# Patient Record
Sex: Male | Born: 1970
Health system: Southern US, Community
[De-identification: ages and names within clinical notes are randomized; demographics above are authoritative.]

## PROBLEM LIST (undated history)

## (undated) DIAGNOSIS — F419 Anxiety disorder, unspecified: Secondary | ICD-10-CM

## (undated) DIAGNOSIS — E7800 Pure hypercholesterolemia, unspecified: Secondary | ICD-10-CM

## (undated) DIAGNOSIS — F329 Major depressive disorder, single episode, unspecified: Secondary | ICD-10-CM

## (undated) DIAGNOSIS — F32A Depression, unspecified: Secondary | ICD-10-CM

## (undated) DIAGNOSIS — M5410 Radiculopathy, site unspecified: Secondary | ICD-10-CM

## (undated) HISTORY — DX: Anxiety disorder, unspecified: F41.9

## (undated) HISTORY — DX: Pure hypercholesterolemia, unspecified: E78.00

## (undated) HISTORY — DX: Radiculopathy, site unspecified: M54.10

## (undated) HISTORY — DX: Major depressive disorder, single episode, unspecified: F32.9

## (undated) HISTORY — DX: Depression, unspecified: F32.A

---

## 2016-05-28 ENCOUNTER — Ambulatory Visit: Payer: PRIVATE HEALTH INSURANCE | Attending: Family Medicine | Admitting: Family Medicine

## 2016-06-06 ENCOUNTER — Ambulatory Visit: Payer: Medicare (Managed Care) | Attending: Family Medicine | Admitting: Family Medicine

## 2016-06-06 ENCOUNTER — Encounter: Payer: Self-pay | Admitting: Licensed Clinical Social Worker

## 2016-06-06 ENCOUNTER — Other Ambulatory Visit: Payer: Self-pay

## 2016-06-06 DIAGNOSIS — M5430 Sciatica, unspecified side: Secondary | ICD-10-CM | POA: Diagnosis not present

## 2016-06-06 DIAGNOSIS — F329 Major depressive disorder, single episode, unspecified: Secondary | ICD-10-CM | POA: Diagnosis not present

## 2016-06-06 DIAGNOSIS — F419 Anxiety disorder, unspecified: Secondary | ICD-10-CM

## 2016-06-06 DIAGNOSIS — G8929 Other chronic pain: Secondary | ICD-10-CM | POA: Diagnosis not present

## 2016-06-06 DIAGNOSIS — Z79899 Other long term (current) drug therapy: Secondary | ICD-10-CM | POA: Diagnosis not present

## 2016-06-06 DIAGNOSIS — G4700 Insomnia, unspecified: Secondary | ICD-10-CM

## 2016-06-06 DIAGNOSIS — Z87828 Personal history of other (healed) physical injury and trauma: Secondary | ICD-10-CM

## 2016-06-06 DIAGNOSIS — R079 Chest pain, unspecified: Secondary | ICD-10-CM | POA: Diagnosis not present

## 2016-06-06 DIAGNOSIS — Z8659 Personal history of other mental and behavioral disorders: Secondary | ICD-10-CM | POA: Diagnosis not present

## 2016-06-06 DIAGNOSIS — K219 Gastro-esophageal reflux disease without esophagitis: Secondary | ICD-10-CM

## 2016-06-06 DIAGNOSIS — Z87820 Personal history of traumatic brain injury: Secondary | ICD-10-CM | POA: Diagnosis not present

## 2016-06-06 DIAGNOSIS — M549 Dorsalgia, unspecified: Secondary | ICD-10-CM | POA: Diagnosis not present

## 2016-06-06 DIAGNOSIS — M542 Cervicalgia: Secondary | ICD-10-CM | POA: Diagnosis not present

## 2016-06-06 MED ORDER — IBUPROFEN 800 MG PO TABS: 800.0000 mg | ORAL_TABLET | Freq: Three times a day (TID) | ORAL | 0 refills | Status: DC | PRN

## 2016-06-06 MED ORDER — PAROXETINE HCL 20 MG PO TABS: 20.0000 mg | ORAL_TABLET | Freq: Every day | ORAL | 2 refills | Status: DC

## 2016-06-06 MED ORDER — OMEPRAZOLE 20 MG PO CPDR: 20.0000 mg | DELAYED_RELEASE_CAPSULE | Freq: Every day | ORAL | 1 refills | Status: DC

## 2016-06-06 MED ORDER — METHOCARBAMOL 500 MG PO TABS
500.0000 mg | ORAL_TABLET | Freq: Three times a day (TID) | ORAL | 0 refills | Status: DC | PRN
Start: 2016-06-06 — End: 2016-07-18

## 2016-06-06 MED ORDER — TRAZODONE HCL 50 MG PO TABS: 25.0000 mg | ORAL_TABLET | Freq: Every evening | ORAL | 0 refills | Status: DC | PRN

## 2016-06-06 MED ORDER — DULOXETINE HCL 60 MG PO CPEP: 60.0000 mg | ORAL_CAPSULE | Freq: Every day | ORAL | 3 refills | Status: DC

## 2016-06-06 MED ORDER — AMITRIPTYLINE HCL 25 MG PO TABS: 25.0000 mg | ORAL_TABLET | Freq: Every day | ORAL | 2 refills | Status: DC

## 2016-06-06 MED ORDER — CYCLOBENZAPRINE HCL 10 MG PO TABS: 10.0000 mg | ORAL_TABLET | Freq: Three times a day (TID) | ORAL | 0 refills | Status: DC | PRN

## 2016-06-06 MED FILL — ?CYCLOBENZAPRINE 10 MG TABL: 10 | 20 days supply | Qty: 60 | Fill #0

## 2016-06-06 MED FILL — AMITRIPTYLINE HCL 25 MG TAB: 25 | 30 days supply | Qty: 30 | Fill #0

## 2016-06-06 MED FILL — PARoxetine HCL 20 MG TABS: 20 | 30 days supply | Qty: 30 | Fill #0

## 2016-06-06 MED FILL — ?OMEPRAZOLE DR 20 MG CAPSUL: 20 | 30 days supply | Qty: 30 | Fill #0

## 2016-06-06 MED FILL — IBUPROFEN 800 MG TABLET: 800 | 20 days supply | Qty: 60 | Fill #0

## 2016-06-06 MED FILL — DULoxetine HCL 60 MG CPEP: 60 | 30 days supply | Qty: 30 | Fill #0

## 2016-06-06 MED FILL — METHOCARBAMOL 500 MG TABLET: 500 | 10 days supply | Qty: 30 | Fill #0

## 2016-06-06 MED FILL — traZODone HCL 50 MG TABS: 50 | 30 days supply | Qty: 30 | Fill #0

## 2016-06-06 NOTE — Patient Instructions (Addendum)
Trazodone tablets Qu es este medicamento? La TRAZODONA se utiliza para tratar la depresin. Este medicamento puede ser utilizado para otros usos; si tiene alguna pregunta consulte con su proveedor de atencin mdica o con su farmacutico. MARCAS COMUNES: Desyrel Qu le debo informar a mi profesional de la salud antes de tomar este medicamento? Necesita saber si usted presenta alguno de los siguientes problemas o situaciones: -intento de suicidio o con ideas suicidas -trastorno bipolar -problemas sanguneos -glaucoma -enfermedad cardiaca o ataque cardiaco previo -latidos cardiacos irregulares -enfermedad renal o heptica -niveles bajos de sodio en la sangre -una reaccin alrgica o inusual a la trazodona, a otros medicamentos, alimentos, colorantes o conservantes -si est embarazada o buscando quedar embarazada -si est amamantando a un beb Cmo debo utilizar este medicamento? Tome este medicamento por va oral con un vaso de agua. Siga las instrucciones de la etiqueta del [place]. [employer] medicamento poco despus de una comida o un refrigerio liviano. Tome sus dosis a intervalos regulares. No tome su medicamento con una frecuencia mayor a la indicada. No deje de tomar [employer] repentinamente a menos que as indique su mdico. El detener este medicamento demasiado rpido puede causar efectos secundarios graves o puede empeorar su condicin. Su farmacutico le dar una gua del medicamento especial con cada receta y relleno. Asegrese de leer esta informacin cada vez cuidadosamente. Hable con su pediatra para informarse acerca del uso de este medicamento en nios. Puede requerir atencin especial. Sobredosis: Pngase en contacto inmediatamente con un centro toxicolgico o una sala de urgencia si usted cree que haya tomado demasiado medicamento. ATENCIN: [employer] es solo para usted. No comparta este medicamento con nadie. Qu sucede si me olvido de una dosis? Si  olvida una dosis, tmela lo antes posible. Si es casi la hora de la prxima dosis, tome slo esa dosis. No tome dosis adicionales o dobles. Qu puede interactuar con este medicamento? No tome esta medicina con ninguno de los siguientes medicamentos: ciertos medicamentos para infecciones micticas, tales como fluconazol, quetoconazol, itraconazol, posaconazol, voriconazol cisapride dofetilida dronedarona linezolid IMAOs, tales como Carbex, Eldepryl, Marplan, Nardil y Parnate mesoridazina azul de metileno (va intravenosa) pimozida saquinavir tioridazina ziprasidona Esta medicina tambin puede interactuar con los siguientes medicamentos: alcohol medicamentos antivricos para el VIH o SIDA aspirina o medicamentos tipo aspirina barbitricos tales como el fenobarbital ciertos medicamentos para la presin sangunea, enfermedad cardiaca, pulso cardiaco irregular ciertos medicamentos para la depresin, ansiedad o trastornos psicticos ciertos medicamentos para las migraas, tales como almotriptn, eletriptn, frovatriptn, naratriptn, rizatriptn, sumatriptn, zolmitriptn ciertos medicamentos para convulsiones, tales como carbamazepina y fenitona ciertos medicamento para conciliar el sueo ciertos medicamentos que tratan o previenen cogulos sanguneos, como dalteparina, enoxaparina, warfarina digoxina fentanilo litio los [place], medicamentos para el dolor o inflamacin, como ibuprofeno o naproxeno otros medicamentos que prolongan el intervalo QT (causa un ritmo cardiaco anormal) rasagilina medicamentos a base de hierbas que contienen kava kava, hierba de [place] o valeriana tramadol triptfano Puede ser que esta lista no menciona todas las posibles interacciones. Informe a su profesional de [employer]	[place] de [employer] productos a base de hierbas, medicamentos de [place] o suplementos nutritivos que est tomando. Si usted fuma, consume bebidas alcohlicas o si utiliza drogas ilegales, indqueselo tambin a su  profesional de KB [employer]	[place]. Algunas sustancias pueden interactuar con su medicamento. A qu debo estar atento al usar Coca[employer]Cola? Informe a su mdico si sus sntomas no mejoran o si empeoran. Visite a su mdico o a Barrister's clerk de [employer]	[place]  para chequear su evolucin peridicamente. Debido que puede ser necesario tomar este medicamento durante varias semanas para que sea posible observar sus efectos en forma [place], es importante que sigue su tratamiento como recetado por su mdico. Los pacientes y sus familias deben estar atentos si empeora la depresin o ideas suicidas. Tambin est atento a cambios repentinos o severos de emocin, tales como el sentirse ansioso, agitado, lleno de pnico, irritable, hostil, agresivo, impulsivo, inquietud severa, demasiado excitado y hiperactivo o dificultad para conciliar el sueo. Si esto ocurre, especialmente al comenzar con el tratamiento o al cambiar de dosis, comunquese con su profesional de [employer]	[place]. Puede experimentar somnolencia, mareos o visin borrosa. No conduzca ni utilice maquinaria, ni haga nada que Associate Professor en estado de alerta hasta que sepa cmo le afecta este medicamento. No se siente ni se ponga de pie con rapidez, especialmente si es un paciente de edad avanzada. Esto reduce el riesgo de mareos o [employer]. El alcohol puede interferir con el efecto de este medicamento. Evite consumir bebidas alcohlicas. Este medicamento puede provocar sequedad de los ojos y visin borrosa. Su [place] lentes de contacto, puede sentir [employer]. Las gotas lubricantes pueden ayudarle. Si el problema no desaparece o es severo, visite a su mdico de ojos. Este medicamento puede secarle la boca. El Engineer, manufacturing systems chicle sin azcar, chupar caramelos duros y tomar agua en abundancia le ayudarn a mantener la boca hmeda. Si el problema no desaparece o es severo, consulte a su mdico. Qu efectos secundarios puedo tener al [employer] este medicamento? Efectos  secundarios que debe informar a su mdico o a Barrister's clerk de la salud tan pronto como sea posible: Chief of Staff, como erupcin cutnea, comezn/picazn o urticarias, e hinchazn de la cara, los labios o la lengua estado de nimo elevado, menor necesidad de dormir, pensamientos acelerados, conducta impulsiva confusin ritmo cardiaco rpido, irregular sensacin de desmayos o aturdimiento, cadas sensacin de agitacin, enojo o irritabilidad prdida de equilibrio o coordinacin ereccin dolorosa o prolongada inquietud, caminar de un lado a otro, incapacidad para quedarse quieto ideas suicidas u otros cambios en el estado de nimo temblores dificultad para conciliar el sueo convulsiones sangrado o moretones inusuales Efectos secundarios que generalmente no requieren atencin mdica (infrmelos a su mdico o a Barrister's clerk de la salud si persisten o si son molestos): cambios en el deseo o desempeo sexual cambios en el apetito o el peso estreimiento dolor de cabeza dolores musculares nuseas Puede ser que esta lista no menciona todos los posibles efectos secundarios. Comunquese a su mdico por asesoramiento mdico [employer]. Usted puede informar los efectos secundarios a la FDA por telfono al 1-800-FDA-1088. Dnde debo guardar mi medicina? Mantngala fuera del alcance de los nios. Gurdela a [employer], entre 15 y 24 grados C (29 y 9 grados F). Protjala de la luz. Mantenga el envase bien cerrado. Deseche los medicamentos que no haya utilizado, despus de la fecha de vencimiento. ATENCIN: Este folleto es un resumen. Puede ser que no cubra toda la posible informacin. Si usted tiene preguntas acerca de esta medicina, consulte con su mdico, su farmacutico o su profesional de Technical sales engineer.  2017 Elsevier/Gold Standard (2015-11-09 00:00:00)      Methocarbamol tablets Qu es este medicamento? El METOCARBAMOL ayuda a Best boy y la rigidez muscular  causado por esfuerzos, esguinces y Scientist, research (medical) lesiones de los msculos. Este medicamento puede ser utilizado para otros usos; si tiene alguna pregunta consulte con su proveedor de atencin mdica o con  su farmacutico. MARCAS COMUNES: [place] Qu le debo informar a mi profesional de la salud antes de tomar este medicamento? Necesita saber si usted presenta alguno de los siguientes problemas o situaciones: -enfermedad renal -convulsiones -una reaccin alrgica o inusual al metocarbamol, a otros medicamentos, alimentos, colorantes o conservantes -si est embarazada o buscando quedar embarazada -si est amamantando a un beb Cmo debo utilizar este medicamento? Tome este medicamento por va oral con un vaso lleno de agua. Siga las instrucciones de la etiqueta del [place]. Tome sus dosis a intervalos regulares. No tome su medicamento con una frecuencia mayor a la indicada. Hable con su pediatra para informarse acerca del uso de este medicamento en nios. Puede requerir atencin especial. Sobredosis: Pngase en contacto inmediatamente con un centro toxicolgico o una sala de urgencia si usted cree que haya tomado demasiado medicamento. ATENCIN: [employer] es solo para usted. No comparta este medicamento con nadie. Qu sucede si me olvido de una dosis? Si olvida una dosis, tmela lo antes posible. Si es casi la hora de la prxima dosis, tome slo esa dosis. No tome dosis adicionales o dobles. Qu puede interactuar con este medicamento? No tome esta medicina con ninguno de los siguientes medicamentos: medicamentos narcticos para la tos Esta medicina tambin puede interactuar con los siguientes medicamentos: alcohol antihistamnicos para Buyer, retail, tos y resfro ciertos medicamentos para la ansiedad o para conciliar el sueo ciertos medicamentos para la depresin, como amitriptilina, fluoxetina, sertralina ciertos medicamentos para convulsiones, tales como fenobarbital, primidona inhibidores de  colinesterasa, tales como neostigmina, ambenonium y bromuro de piridostigmina anestsicos generales, tales como halotano, isoflurano, metoxiflurano, propofol anestsicos locales, tales como lidocana, pramoxina, tetracana medicamentos para relajar los msculos antes de una ciruga medicamentos narcticos para Conservation officer, historic buildings fenotiazinas, tales como clorpromazina, Musician, Government social research officer, tioridazina Puede ser que esta lista no menciona todas las posibles interacciones. Informe a su profesional de KB Home	[place] de [employer] productos a base de hierbas, medicamentos de [place] o suplementos nutritivos que est tomando. Si usted fuma, consume bebidas alcohlicas o si utiliza drogas ilegales, indqueselo tambin a su profesional de [employer]	[place]. Algunas sustancias pueden interactuar con su medicamento. A qu debo estar atento al usar Coca[employer]Cola? Informe a su mdico o a su profesional de la salud si sus sntomas no comienzan a mejorar o si empeoran. Puede experimentar somnolencia o mareos. No conduzca, no utilice maquinaria ni haga nada que Associate Professor en estado de alerta hasta que sepa cmo le afecta este medicamento. No se siente ni se ponga de pie con rapidez, especialmente si es un paciente de edad avanzada. Esto reduce el riesgo de mareos o [employer]. El alcohol puede interferir con el efecto de este medicamento. Evite consumir bebidas alcohlicas. Si est tomando otro medicamento que tambin causa somnolencia, es posible que tenga ms efectos secundarios. Entrguele a su proveedor de atencin mdica una lista de todos los medicamentos que [place]. Su mdico le dir cunto Dentist. No tome ms medicamento que lo indicado. Llame al servicio de emergencias para recibir ayuda si tiene problemas para respirar o somnolencia inusual. Qu efectos secundarios puedo tener al [employer] este medicamento? Efectos secundarios que debe informar a su mdico o a Barrister's clerk de la salud tan pronto como sea  posible: Chief of Staff, como erupcin cutnea, comezn/picazn o urticarias, hinchazn de la cara, los labios o la lengua problemas respiratorios confusin convulsiones cansancio o debilidad inusual Efectos secundarios que generalmente no requieren atencin mdica (debe informarlos a su mdico o a Barrister's clerk de la  salud si persisten o si son molestos): mareos dolor de cabeza sabor metlico cansancio Higher education careers adviser Puede ser que esta lista no menciona todos los posibles efectos secundarios. Comunquese a su mdico por asesoramiento mdico [employer]. Usted puede informar los efectos secundarios a la FDA por telfono al 1-800-FDA-1088. Dnde debo guardar mi medicina? Mantngala fuera del alcance de los nios. Gurdela a una temperatura, entre 20 y 23 grados C (39 y 3 grados F). Mantenga el envase bien cerrado. Deseche los medicamentos que no haya utilizado, despus de la fecha de vencimiento. ATENCIN: Este folleto es un resumen. Puede ser que no cubra toda la posible informacin. Si usted tiene preguntas acerca de esta medicina, consulte con su mdico, su farmacutico o su profesional de Technical sales engineer.  2017 Elsevier/Gold Standard (2015-02-27 00:00:00)
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
## 2016-06-06 NOTE — Progress Notes (Signed)
Patient is here for establish care   Patient complains about back pain, head pain and his right leg get numbs all the way to his right toe  Patient also stated that his testicles pain that comes and goes  Patient also has severe depression
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
## 2016-06-09 ENCOUNTER — Telehealth: Payer: Self-pay | Admitting: Family Medicine

## 2016-06-09 LAB — H. PYLORI BREATH TEST: H. PYLORI BREATH TEST: NOT DETECTED

## 2016-06-09 NOTE — Telephone Encounter (Signed)
[facility] Prior Authorization  Just inform me  that his insurance is good until 06/11/2016

## 2016-06-09 NOTE — Telephone Encounter (Signed)
Good Afternoon  [facility]  Call me and let me know that patient insurance need prior authorization I called them and they said that I need to fax clinic notes and it will take 24 hours for approval . Can you please, call radiology to reschedule his appointment for tomorrow at 3pm and also notify the patient  Thank You .

## 2016-06-09 NOTE — Telephone Encounter (Signed)
CMA call to inform patient that the insurance need authorization prior to the appointment & it will takes 24 hour to get approve so if it doesn't get approve we going have to rescheduled the appt   Spoke with patient wife  Wife was aware and understood

## 2016-06-09 NOTE — Telephone Encounter (Signed)
CMA call patient to inform about canceled appointment for CT scan of head because of the expire insurance  Patient was aware and understood

## 2016-06-09 NOTE — Telephone Encounter (Signed)
Joshua SimasJohanna  Can you contact radiology at [phone] to canceled the appt and to remind you that his insurance expired 2/28 so after that don't need prior authorization  Thank you  [facility] is asking me if not they are going to canceled the appt  .

## 2016-06-10 ENCOUNTER — Ambulatory Visit (HOSPITAL_COMMUNITY): Payer: PRIVATE HEALTH INSURANCE

## 2016-06-12 NOTE — Telephone Encounter (Signed)
CMA call to go over H pylori results   Patient wife Verify DOB  Spoke with patient wife she was aware and understood

## 2016-06-12 NOTE — Telephone Encounter (Signed)
-----   Message from Lizbeth BarkMandesia R Hairston, FNP sent at 06/12/2016  3:57 AM EST ----- -H.pylori is negative. H.pylori is a bacteria that can infect the stomach and cause stomach ulcers. It can cause symptoms of acid reflux like heartburn.

## 2016-07-18 ENCOUNTER — Ambulatory Visit: Payer: PPO | Attending: Family Medicine | Admitting: Family Medicine

## 2016-07-18 VITALS — BP 124/82 | HR 67 | Temp 98.0°F | Resp 18 | Ht 68.0 in | Wt 169.4 lb

## 2016-07-18 DIAGNOSIS — M5441 Lumbago with sciatica, right side: Secondary | ICD-10-CM | POA: Diagnosis not present

## 2016-07-18 DIAGNOSIS — Z7689 Persons encountering health services in other specified circumstances: Secondary | ICD-10-CM | POA: Insufficient documentation

## 2016-07-18 DIAGNOSIS — Z87820 Personal history of traumatic brain injury: Secondary | ICD-10-CM | POA: Diagnosis not present

## 2016-07-18 DIAGNOSIS — K4090 Unilateral inguinal hernia, without obstruction or gangrene, not specified as recurrent: Secondary | ICD-10-CM

## 2016-07-18 DIAGNOSIS — G8929 Other chronic pain: Secondary | ICD-10-CM | POA: Diagnosis not present

## 2016-07-18 DIAGNOSIS — N50819 Testicular pain, unspecified: Secondary | ICD-10-CM

## 2016-07-18 DIAGNOSIS — Z87828 Personal history of other (healed) physical injury and trauma: Secondary | ICD-10-CM

## 2016-07-18 DIAGNOSIS — Z79899 Other long term (current) drug therapy: Secondary | ICD-10-CM | POA: Diagnosis not present

## 2016-07-18 DIAGNOSIS — F339 Major depressive disorder, recurrent, unspecified: Secondary | ICD-10-CM | POA: Diagnosis not present

## 2016-07-18 MED ORDER — METHOCARBAMOL 500 MG PO TABS: 500.0000 mg | ORAL_TABLET | Freq: Three times a day (TID) | ORAL | 0 refills | Status: DC | PRN

## 2016-07-18 MED ORDER — ACETAMINOPHEN-CODEINE 300-30 MG PO TABS: 1.0000 | ORAL_TABLET | Freq: Four times a day (QID) | ORAL | 0 refills | Status: DC | PRN

## 2016-07-18 MED FILL — ACETAMINOPHEN/COD #3 TABLET: 300-30 | 6 days supply | Qty: 50 | Fill #0

## 2016-07-18 MED FILL — METHOCARBAMOL 500 MG TABLET: 500 | 10 days supply | Qty: 30 | Fill #0

## 2016-07-18 NOTE — Progress Notes (Signed)
Patient is here for f/up  Patient  Complain about lower back pain  Patient has taking his ibuprofen & his robaxin for today  Patient has eaten for today
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
## 2016-07-21 ENCOUNTER — Ambulatory Visit (HOSPITAL_COMMUNITY)
Admission: RE | Admit: 2016-07-21 | Discharge: 2016-07-21 | Disposition: A | Discharge status: Home / Self Care | Payer: PPO | Source: Ambulatory Visit | Attending: Family Medicine | Admitting: Family Medicine

## 2016-07-21 ENCOUNTER — Encounter (HOSPITAL_COMMUNITY): Payer: Self-pay

## 2016-07-21 DIAGNOSIS — I700 Atherosclerosis of aorta: Secondary | ICD-10-CM | POA: Diagnosis not present

## 2016-07-21 DIAGNOSIS — M5430 Sciatica, unspecified side: Secondary | ICD-10-CM | POA: Insufficient documentation

## 2016-07-21 DIAGNOSIS — F339 Major depressive disorder, recurrent, unspecified: Secondary | ICD-10-CM | POA: Insufficient documentation

## 2016-07-21 DIAGNOSIS — M549 Dorsalgia, unspecified: Principal | ICD-10-CM

## 2016-07-21 DIAGNOSIS — M545 Low back pain: Secondary | ICD-10-CM | POA: Diagnosis not present

## 2016-07-23 ENCOUNTER — Other Ambulatory Visit: Payer: Self-pay | Admitting: Family Medicine

## 2016-07-28 ENCOUNTER — Other Ambulatory Visit: Payer: Self-pay | Admitting: Family Medicine

## 2016-07-28 DIAGNOSIS — M5441 Lumbago with sciatica, right side: Secondary | ICD-10-CM

## 2016-07-28 DIAGNOSIS — M5146 Schmorl's nodes, lumbar region: Secondary | ICD-10-CM

## 2016-07-28 DIAGNOSIS — I700 Atherosclerosis of aorta: Secondary | ICD-10-CM

## 2016-07-28 DIAGNOSIS — G8929 Other chronic pain: Secondary | ICD-10-CM

## 2016-07-28 MED ORDER — ASPIRIN 81 MG PO TABS: 81.0000 mg | ORAL_TABLET | Freq: Every day | ORAL | 0 refills | Status: DC

## 2016-07-29 ENCOUNTER — Telehealth: Payer: Self-pay

## 2016-07-29 NOTE — Telephone Encounter (Signed)
CMA call patient to go over results of x ray  Patient did not answer but left a VM stating the reason of the call & to call me back

## 2016-07-29 NOTE — Telephone Encounter (Signed)
Patient wife called to speak with nurse to get the X-Ray result, please follow up

## 2016-07-29 NOTE — Telephone Encounter (Signed)
-----   Message from Lizbeth Bark, FNP sent at 07/28/2016  1:33 PM EDT ----- Cervical spine x-ray showed mild bone impingement to the cervical spine. Follow up with orthopedic referral.

## 2016-07-29 NOTE — Telephone Encounter (Signed)
CMA call Patient to go over lab results  Patient Verify DOB  Patient was aware and understood

## 2016-07-29 NOTE — Telephone Encounter (Signed)
-----   Message from Lizbeth Bark, FNP sent at 07/28/2016  1:28 PM EDT ----- No chronic bone abnormality of the spine.  There is an area of possible spinal disc herniation at the lumbar spine. 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 This can increase your risk of cardiovascular diseases. You will be prescribed aspirin to help lower this risk. Recommend scheduling labs appointment to have cholesterol levels & liver function checked to determine if cholesterol lowering medications are appropriate. Due to chronic symptoms of back MRI of spine was recommended. Will order MRI.

## 2016-07-30 ENCOUNTER — Ambulatory Visit (HOSPITAL_COMMUNITY)
Admission: RE | Admit: 2016-07-30 | Discharge: 2016-07-30 | Disposition: A | Discharge status: Home / Self Care | Payer: PPO | Source: Ambulatory Visit | Attending: Family Medicine | Admitting: Family Medicine

## 2016-07-30 DIAGNOSIS — J329 Chronic sinusitis, unspecified: Secondary | ICD-10-CM | POA: Insufficient documentation

## 2016-07-30 DIAGNOSIS — Z87828 Personal history of other (healed) physical injury and trauma: Secondary | ICD-10-CM | POA: Insufficient documentation

## 2016-07-30 DIAGNOSIS — R51 Headache: Secondary | ICD-10-CM | POA: Diagnosis not present

## 2016-07-30 DIAGNOSIS — Z87820 Personal history of traumatic brain injury: Secondary | ICD-10-CM | POA: Insufficient documentation

## 2016-07-31 ENCOUNTER — Telehealth: Payer: Self-pay | Admitting: Family Medicine

## 2016-07-31 ENCOUNTER — Other Ambulatory Visit: Payer: Self-pay | Admitting: Family Medicine

## 2016-07-31 DIAGNOSIS — G8929 Other chronic pain: Secondary | ICD-10-CM

## 2016-07-31 DIAGNOSIS — Z299 Encounter for prophylactic measures, unspecified: Secondary | ICD-10-CM

## 2016-07-31 DIAGNOSIS — M549 Dorsalgia, unspecified: Secondary | ICD-10-CM

## 2016-07-31 DIAGNOSIS — M5441 Lumbago with sciatica, right side: Secondary | ICD-10-CM

## 2016-07-31 DIAGNOSIS — J349 Unspecified disorder of nose and nasal sinuses: Secondary | ICD-10-CM

## 2016-07-31 DIAGNOSIS — M542 Cervicalgia: Secondary | ICD-10-CM

## 2016-07-31 MED ORDER — DIAZEPAM 5 MG PO TABS: 5.0000 mg | ORAL_TABLET | Freq: Once | ORAL | 0 refills | Status: DC

## 2016-07-31 MED ORDER — LORAZEPAM 1 MG PO TABS
1.0000 mg | ORAL_TABLET | Freq: Once | ORAL | 0 refills | Status: AC
Start: 2016-07-31 — End: 2016-07-31

## 2016-07-31 NOTE — Telephone Encounter (Signed)
Intrepreter service used. Interpreter ID# [id number]. Notified patient's spouse of imaging results and plan. She communicates understanding.

## 2016-07-31 NOTE — Telephone Encounter (Signed)
CMA call patient to let them know their MRI appt on 08/12/2016 @ 1:00 pm @  [facility]   Spoke with wife patient   Was aware and understood

## 2016-08-12 ENCOUNTER — Ambulatory Visit (HOSPITAL_COMMUNITY): Admission: RE | Admit: 2016-08-12 | Payer: PPO | Source: Ambulatory Visit

## 2016-08-14 DIAGNOSIS — R1031 Right lower quadrant pain: Secondary | ICD-10-CM | POA: Diagnosis not present

## 2016-08-15 DIAGNOSIS — J343 Hypertrophy of nasal turbinates: Secondary | ICD-10-CM | POA: Diagnosis not present

## 2016-08-15 DIAGNOSIS — J310 Chronic rhinitis: Secondary | ICD-10-CM | POA: Diagnosis not present

## 2016-08-15 DIAGNOSIS — R51 Headache: Secondary | ICD-10-CM | POA: Diagnosis not present

## 2016-09-06 ENCOUNTER — Ambulatory Visit (HOSPITAL_COMMUNITY)
Admission: RE | Admit: 2016-09-06 | Discharge: 2016-09-06 | Disposition: A | Discharge status: Home / Self Care | Payer: PPO | Source: Ambulatory Visit | Attending: Family Medicine | Admitting: Family Medicine

## 2016-09-06 DIAGNOSIS — M5441 Lumbago with sciatica, right side: Secondary | ICD-10-CM | POA: Insufficient documentation

## 2016-09-06 DIAGNOSIS — M5127 Other intervertebral disc displacement, lumbosacral region: Secondary | ICD-10-CM | POA: Diagnosis not present

## 2016-09-06 DIAGNOSIS — M129 Arthropathy, unspecified: Secondary | ICD-10-CM | POA: Diagnosis not present

## 2016-09-06 DIAGNOSIS — G8929 Other chronic pain: Secondary | ICD-10-CM | POA: Insufficient documentation

## 2016-09-06 DIAGNOSIS — M5146 Schmorl's nodes, lumbar region: Secondary | ICD-10-CM | POA: Diagnosis not present

## 2016-09-06 DIAGNOSIS — M545 Low back pain: Secondary | ICD-10-CM | POA: Diagnosis not present

## 2016-09-09 ENCOUNTER — Encounter (INDEPENDENT_AMBULATORY_CARE_PROVIDER_SITE_OTHER): Payer: Self-pay | Admitting: Orthopaedic Surgery

## 2016-09-09 ENCOUNTER — Ambulatory Visit (INDEPENDENT_AMBULATORY_CARE_PROVIDER_SITE_OTHER): Payer: PPO | Admitting: Orthopaedic Surgery

## 2016-09-09 VITALS — BP 142/92 | HR 66 | Ht 68.0 in | Wt 178.0 lb

## 2016-09-09 DIAGNOSIS — M545 Low back pain, unspecified: Secondary | ICD-10-CM

## 2016-09-09 DIAGNOSIS — G8929 Other chronic pain: Secondary | ICD-10-CM | POA: Diagnosis not present

## 2016-09-09 DIAGNOSIS — M542 Cervicalgia: Secondary | ICD-10-CM

## 2016-09-09 MED ORDER — LORAZEPAM 2 MG PO TABS: ORAL_TABLET | ORAL | 0 refills | Status: DC
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
## 2016-09-09 NOTE — Addendum Note (Signed)
Addended by: Rogers SeedsYEATTS, Taiden Raybourn M on: 09/09/2016 04:00 PM   Modules accepted: Orders

## 2016-09-10 ENCOUNTER — Ambulatory Visit (HOSPITAL_COMMUNITY): Payer: PPO | Admitting: Psychiatry

## 2016-09-11 ENCOUNTER — Telehealth (INDEPENDENT_AMBULATORY_CARE_PROVIDER_SITE_OTHER): Payer: Self-pay | Admitting: *Deleted

## 2016-09-11 NOTE — Telephone Encounter (Signed)
Pt has appt scheduled at [facility] on June 11 at 5pm, pt is to arrive 15 mins early to register, pt will also need a driver with him and do not take the valium until he arrives at hospital. [facility] for appt information

## 2016-09-12 ENCOUNTER — Ambulatory Visit: Payer: PPO | Attending: Family Medicine | Admitting: Family Medicine

## 2016-09-12 ENCOUNTER — Encounter: Payer: Self-pay | Admitting: Family Medicine

## 2016-09-12 VITALS — BP 132/90 | HR 76 | Temp 98.0°F | Resp 18 | Ht 68.0 in | Wt 173.8 lb

## 2016-09-12 DIAGNOSIS — Z8659 Personal history of other mental and behavioral disorders: Secondary | ICD-10-CM | POA: Diagnosis not present

## 2016-09-12 DIAGNOSIS — K4090 Unilateral inguinal hernia, without obstruction or gangrene, not specified as recurrent: Secondary | ICD-10-CM | POA: Diagnosis not present

## 2016-09-12 DIAGNOSIS — M545 Low back pain: Secondary | ICD-10-CM | POA: Insufficient documentation

## 2016-09-12 DIAGNOSIS — G8929 Other chronic pain: Secondary | ICD-10-CM

## 2016-09-12 DIAGNOSIS — M5441 Lumbago with sciatica, right side: Secondary | ICD-10-CM

## 2016-09-12 DIAGNOSIS — N50819 Testicular pain, unspecified: Secondary | ICD-10-CM

## 2016-09-12 DIAGNOSIS — F329 Major depressive disorder, single episode, unspecified: Secondary | ICD-10-CM | POA: Diagnosis not present

## 2016-09-12 DIAGNOSIS — Z7982 Long term (current) use of aspirin: Secondary | ICD-10-CM | POA: Diagnosis not present

## 2016-09-12 DIAGNOSIS — Z760 Encounter for issue of repeat prescription: Secondary | ICD-10-CM | POA: Diagnosis not present

## 2016-09-12 DIAGNOSIS — T1490XS Injury, unspecified, sequela: Secondary | ICD-10-CM | POA: Diagnosis not present

## 2016-09-12 DIAGNOSIS — N50811 Right testicular pain: Secondary | ICD-10-CM | POA: Diagnosis not present

## 2016-09-12 MED ORDER — PAROXETINE HCL 20 MG PO TABS: 20.0000 mg | ORAL_TABLET | Freq: Every day | ORAL | 2 refills | Status: DC

## 2016-09-12 MED ORDER — METHOCARBAMOL 500 MG PO TABS: 500.0000 mg | ORAL_TABLET | Freq: Three times a day (TID) | ORAL | 0 refills | Status: DC | PRN

## 2016-09-12 MED ORDER — ASPIRIN 81 MG PO TABS: 81.0000 mg | ORAL_TABLET | Freq: Every day | ORAL | 3 refills | Status: DC

## 2016-09-12 MED ORDER — ACETAMINOPHEN-CODEINE 300-30 MG PO TABS: 1.0000 | ORAL_TABLET | Freq: Four times a day (QID) | ORAL | 0 refills | Status: DC | PRN

## 2016-09-12 MED ORDER — DULOXETINE HCL 60 MG PO CPEP: 60.0000 mg | ORAL_CAPSULE | Freq: Every day | ORAL | 3 refills | Status: DC

## 2016-09-12 MED ORDER — IBUPROFEN 800 MG PO TABS: 800.0000 mg | ORAL_TABLET | Freq: Three times a day (TID) | ORAL | 0 refills | Status: DC | PRN

## 2016-09-12 NOTE — Progress Notes (Signed)
Subjective:  Patient ID: Joshua [name], male    DOB: [birth year]  Age: 46 y.o. MRN: [id number]  CC: Follow-up   HPI Joshua [name] presents for    Subjective:  Patient ID: Joshua [name], male    DOB: 03-26-1971  Age: 46 y.o. MRN: [id number]  CC: Follow-up   HPI Joshua [name] presents for    Chronic back pain: History of back injuries in 2008 and 2011 while in [place] Reports right leg sciatica. Back pain on average 9 out of 10. Denies any bowel or bladder incontinence. History of spinal injections in the past. Reports ambulation with cane since 2012. Previous workup has included x-ray and MRI.  He is now being seen by an orthopedic specialist. Current medications include anti-inflammatories, muscle relaxants, and narcotic pain medication.   History of depression: Diagnosed with depression in 2012 while in [place] Reports being under the care of a psychiatrist. He reports being late psychiatric appointment and appointment had to be rescheduled.  Reports not taking Cymbalta. Denies any SI/HI. Referral made at last visit.   Testicular pain: Pain to right testicle. Denies any history of significant injury. Denies any nausea or vomiting, swelling, or temperature change to the right testicle.Denies any masses or lumps to the testicles. Pain is aggrevated by bearing down.symptoms stable and unchanged since last visit.  Referral to general  surgery  previous office visit.  Outpatient Medications Prior to Visit  Medication Sig Dispense Refill  . fluticasone (FLONASE) 50 MCG/ACT nasal spray Place into the nose.    Marland Kitchen omeprazole (PRILOSEC) 20 MG capsule Take 1 capsule (20 mg total) by mouth daily. 30 capsule 1  . risperiDONE (RISPERDAL) 0.5 MG tablet Take 0.5 mg by mouth at bedtime.    . temazepam (RESTORIL) 30 MG capsule Take 30 mg by mouth at bedtime as needed for sleep.    . traZODone (DESYREL) 50 MG tablet Take 0.5-1 tablets (25-50 mg total) by mouth at bedtime as needed for  sleep. 30 tablet 0  . Acetaminophen-Codeine (TYLENOL/CODEINE #3) 300-30 MG tablet Take 1-2 tablets by mouth every 6 (six) hours as needed for pain. 50 tablet 0  . aspirin 81 MG tablet Take 1 tablet (81 mg total) by mouth daily. 90 tablet 0  . DULoxetine (CYMBALTA) 60 MG capsule Take 1 capsule (60 mg total) by mouth daily. 30 capsule 3  . ibuprofen (ADVIL,MOTRIN) 800 MG tablet Take 1 tablet (800 mg total) by mouth every 8 (eight) hours as needed for moderate pain or cramping (Take with food.). 60 tablet 0  . LORazepam (ATIVAN) 2 MG tablet Take one tablet prior to MRI. 1 tablet 0  . methocarbamol (ROBAXIN) 500 MG tablet Take 1 tablet (500 mg total) by mouth every 8 (eight) hours as needed for muscle spasms. 30 tablet 0  . PARoxetine (PAXIL) 20 MG tablet Take 1 tablet (20 mg total) by mouth daily. 30 tablet 2   No facility-administered medications prior to visit.     ROS Review of Systems  Respiratory: Negative.   Cardiovascular: Negative.   Gastrointestinal: Negative.   Genitourinary: Positive for testicular pain (hernia).  Musculoskeletal: Positive for back pain (chronic).  Neurological: Positive for weakness.  Psychiatric/Behavioral:       History of major depression disorder.    Objective:  BP 132/90 (BP Location: Left Arm, Patient Position: Sitting, Cuff Size: Normal)   Pulse 76   Temp 98 F (36.7 C) (Oral)   Resp 18   Ht 5\' 8"  (1.727 m)  Wt 173 lb 12.8 oz (78.8 kg)   SpO2 94%   BMI 26.43 kg/m   BP/Weight 09/12/2016 09/09/2016 07/18/2016  Systolic BP 132 142 124  Diastolic BP 90 92 82  Wt. (Lbs) 173.8 178 169.4  BMI 26.43 27.06 25.76   Physical Exam  Constitutional: He is oriented to person, place, and time. He appears well-developed and well-nourished.  HENT:  Head: Normocephalic and atraumatic.  Right Ear: External ear normal.  Left Ear: External ear normal.  Nose: Nose normal.  Mouth/Throat: Oropharynx is clear and moist.  Eyes: Conjunctivae are normal. Pupils are  equal, round, and reactive to light.  Neck: No JVD present.  Cardiovascular: Normal rate, regular rhythm, normal heart sounds and intact distal pulses.   Pulmonary/Chest: Effort normal and breath sounds normal.  Abdominal: Soft. Bowel sounds are normal. A hernia is present. Hernia confirmed positive in the right inguinal area.  Musculoskeletal:       Lumbar back: He exhibits decreased range of motion and pain.  Neurological: He is alert and oriented to person, place, and time. He has normal reflexes. Gait (ambulates  with cane) abnormal.  Skin: Skin is warm and dry.  Psychiatric: His affect is not inappropriate. He expresses no homicidal and no suicidal ideation. He expresses no suicidal plans and no homicidal plans.  Nursing note and vitals reviewed.  Assessment & Plan:   Problem List Items Addressed This Visit      Nervous and Auditory   Chronic bilateral low back pain with right-sided sciatica - Primary     Recommend use of Cymbalta due to sciatica symptoms and  history of depression.    Relevant Medications   ibuprofen (ADVIL,MOTRIN) 800 MG tablet   Acetaminophen-Codeine (TYLENOL/CODEINE #3) 300-30 MG tablet   DULoxetine (CYMBALTA) 60 MG capsule   PARoxetine (PAXIL) 20 MG tablet   methocarbamol (ROBAXIN) 500 MG tablet   aspirin 81 MG tablet   Other Relevant Orders   Ambulatory referral to Physical Therapy     Other   History of major depression   Relevant Medications   DULoxetine (CYMBALTA) 60 MG capsule   PARoxetine (PAXIL) 20 MG tablet   Other Relevant Orders   Ambulatory referral to Psychiatry   Inguinal hernia of right side without obstruction or gangrene   Relevant Orders   Ambulatory referral to General Surgery    Other Visit Diagnoses    Medication refill       Relevant Medications   aspirin 81 MG tablet   Testicular pain       Relevant Orders   PSA (Completed)   Urinalysis Dipstick      Meds ordered this encounter  Medications  . ibuprofen  (ADVIL,MOTRIN) 800 MG tablet    Sig: Take 1 tablet (800 mg total) by mouth every 8 (eight) hours as needed for moderate pain or cramping (Take with food.).    Dispense:  60 tablet    Refill:  0    Order Specific Question:   Supervising Provider    Answer:   Quentin Angst [id number]  . Acetaminophen-Codeine (TYLENOL/CODEINE #3) 300-30 MG tablet    Sig: Take 1-2 tablets by mouth every 6 (six) hours as needed for pain.    Dispense:  40 tablet    Refill:  0    Order Specific Question:   Supervising Provider    Answer:   Quentin Angst [id number]  . DULoxetine (CYMBALTA) 60 MG capsule    Sig: Take 1 capsule (60 mg total) by  mouth daily.    Dispense:  30 capsule    Refill:  3    Order Specific Question:   Supervising Provider    Answer:   Quentin AngstJEGEDE, OLUGBEMIGA E [id number]  . PARoxetine (PAXIL) 20 MG tablet    Sig: Take 1 tablet (20 mg total) by mouth daily.    Dispense:  30 tablet    Refill:  2    Order Specific Question:   Supervising Provider    Answer:   Quentin AngstJEGEDE, OLUGBEMIGA E [id number]]  . methocarbamol (ROBAXIN) 500 MG tablet    Sig: Take 1 tablet (500 mg total) by mouth every 8 (eight) hours as needed for muscle spasms.    Dispense:  30 tablet    Refill:  0    Order Specific Question:   Supervising Provider    Answer:   Quentin AngstJEGEDE, OLUGBEMIGA E [id number]]  . aspirin 81 MG tablet    Sig: Take 1 tablet (81 mg total) by mouth daily.    Dispense:  90 tablet    Refill:  3    Order Specific Question:   Supervising Provider    Answer:   Quentin AngstJEGEDE, OLUGBEMIGA E [id number]    Follow-up: Return in about 8 weeks (around 11/07/2016) for Depression.   Lizbeth BarkMandesia R Griff Badley FNP

## 2016-09-12 NOTE — Patient Instructions (Signed)
Citica (Sciatica) La citica es el dolor, entumecimiento, debilidad u hormigueo a lo largo del nervio citico. El nervio citico comienza en la parte inferior de la espalda y desciende por la parte posterior de cada pierna. Controla los msculos en la parte inferior de las piernas y en la parte posterior de las rodillas. Tambin otorga sensibilidad a la parte posterior de los muslos, la parte inferior de las piernas y la planta de los pies. La citica es un sntoma de otra afeccin que ejerce presin o "pellizca" el nervio citico. Generalmente la citica afecta slo un lado del cuerpo. Suele desaparecer por s sola o con tratamiento. En algunos casos, la citica puede volver a aparecer . CAUSAS Esta afeccin causa presin sobre el nervio citico o lo "pellizca". Esto puede ser el resultado de:  Un disco que sobresale demasiado (hernia de disco) entre los huesos de la columna vertebral (vrtebras).  Cambios relacionados con la edad en los discos de la columna vertebral (discopata degenerativa).  Un trastorno doloroso que afecta un msculo de los glteos (sndrome piriforme).  Un crecimiento seo adicional (espoln seo) cerca del nervio citico.  Una lesin o fractura de la pelvis.  Embarazo.  Tumor (poco frecuente). FACTORES DE RIESGO Los siguientes factores pueden hacer que usted sea propenso a sufrir esta afeccin:  Practicar deportes en los que se ejerce presin sobre la columna vertebral o en los que la columna realiza mucho esfuerzo, como el ftbol americano o el levantamiento de pesas.  Tener poca fuerza y flexibilidad.  Antecedentes mdicos de lesiones en la espalda.  Antecedentes mdicos de ciruga en la espalda.  Estar sentado durante largos perodos.  Realizar actividades que requieren agacharse o levantar objetos en forma repetida.  Obesidad. SNTOMAS Los sntomas pueden ser leves o graves, y pueden incluir los siguientes:  Cualquiera de los siguientes problemas  en la parte inferior de la espalda, piernas, cadera o glteos: ? Hormigueo leve o dolor sordo. ? Sensacin de ardor. ? Dolor agudo.  Adormecimiento de la parte posterior de la pantorrilla o la planta del pie.  Debilidad en las piernas.  Dolor de espalda intenso que dificulta el movimiento. Estos sntomas podran empeorar al toser, estornudar o rerse, o cuando se est sentado o de pie durante perodos prolongados. El sobrepeso tambin puede empeorar los sntomas. En algunos casos, los sntomas regresan luego de un tiempo. DIAGNSTICO Esta afeccin se puede diagnosticar en funcin de lo siguiente:  Sus sntomas.  Un examen fsico. El mdico podra indicarle que realice ciertos movimientos para controlar si estos desencadenan los sntomas.  Tambin pueden hacerle exmenes que incluyen lo siguiente: ? Anlisis de sangre. ? Radiografas. ? Resonancia magntica (RM). ? Tomografa computarizada (TC). TRATAMIENTO En muchos casos, esta afeccin mejora por s sola, sin ningn tratamiento. Sin embargo, el tratamiento puede incluir lo siguiente:  Reduccin o modificacin de la actividad fsica en los perodos de dolor.  Ejercicios y estiramiento para fortalecer el abdomen y mejorar la flexibilidad de la columna vertebral.  Aplicacin de calor o hielo en la zona afectada.  Medicamentos para lo siguiente: ? Aliviar el dolor y la inflamacin. ? Relajar los msculos.  Medicamentos inyectables que ayudan a aliviar el dolor, la irritacin y la inflamacin alrededor del nervio citico (esteroides).  Ciruga. INSTRUCCIONES PARA EL CUIDADO EN EL HOGAR Medicamentos  Tome los medicamentos de venta libre y los recetados solamente como se lo haya indicado el mdico.  No conduzca ni opere maquinaria pesada mientras toma analgsicos recetados. Control del dolor    Si se lo indican, aplique hielo en la zona afectada. ? Ponga el hielo en una bolsa plstica. ? Coloque una toalla entre la piel y la  bolsa de hielo. ? Coloque el hielo durante 20 minutos, 2 a 3 veces por da.  Despus del hielo, aplique calor sobre la zona afectada antes de realizar ejercicio o con la frecuencia que le haya indicado el mdico. Use la fuente de calor que el mdico le recomiende, como una compresa de calor hmedo o una almohadilla trmica. ? Coloque una toalla entre la piel y la fuente de calor. ? Aplique el calor durante 20 a 30minutos. ? Retire la fuente de calor si la piel se le pone de color rojo brillante. Esto es muy importante si no puede sentir el dolor, el calor o el fro. Puede correr un riesgo mayor de sufrir quemaduras. Actividad  Reanude sus actividades normales como se lo haya indicado el mdico. Pregntele al mdico qu actividades son seguras para usted. ? Evite las actividades que empeoran los sntomas.  Durante el da, descanse durante lapsos breves. Descansar recostado o de pie suele ser mejor que hacerlo sentado. ? Cuando descanse durante perodos ms largos, incorpore alguna actividad suave o ejercicios de elongacin entre perodos. Esto ayudar a evitar la rigidez y el dolor. ? Evite estar sentado durante largos perodos sin moverse. Levntese y muvase al menos una vez cada hora.  Haga ejercicio y elongue habitualmente, como se lo haya indicado el mdico.  No levante nada que pese ms de 10libras (4,5kg) mientras tenga sntomas de citica. Aunque no tenga sntomas, evite levantar objetos pesados, en especial en forma repetida.  Siempre use las tcnicas de levantamiento correctas para levantar objetos, entre ellas: ? Flexionar las rodillas. ? Mantener la carga cerca del cuerpo. ? No torcerse. Instrucciones generales  Mantenga una buena postura. ? Evite reclinarse hacia adelante cuando est sentado. ? Evite encorvar la espalda mientras est de pie.  Mantenga un peso saludable. El exceso de peso ejerce presin adicional sobre la espalda y hace que resulte difcil mantener una  buena postura.  Use calzado con buen apoyo y cmodo. Evite usar tacones.  Evite dormir sobre un colchn que sea demasiado blando o demasiado duro. Un colchn que ofrezca un apoyo suficientemente firme para su espalda al dormir puede ayudar a aliviar el dolor.  Concurra a todas las visitas de control como se lo haya indicado el mdico. Esto es importante. SOLICITE ATENCIN MDICA SI:  El dolor lo despierta cuando est dormido.  El dolor empeora cuando se acuesta.  El dolor es peor del que experiment en el pasado.  Los sntomas duran ms de 4 semanas.  Pierde peso en forma inexplicable.  SOLICITE ATENCIN MDICA DE INMEDIATO SI:  Pierde el control de la vejiga o del intestino (incontinencia).  Tiene los siguientes sntomas: ? Debilidad que empeora en la parte inferior de la espalda, la pelvis, los glteos o las piernas. ? Enrojecimiento o inflamacin en la espalda. ? Sensacin de ardor al orinar.  Esta informacin no tiene como fin reemplazar el consejo del mdico. Asegrese de hacerle al mdico cualquier pregunta que tenga. Document Released: 03/31/2005 Document Revised: 07/23/2015 Document Reviewed: 12/08/2014 Elsevier Interactive Patient Education  2017 [employer]vier Inc.  

## 2016-09-12 NOTE — Progress Notes (Signed)
Patient is here for f/up back pain  

## 2016-09-13 LAB — PSA: PROSTATE SPECIFIC AG, SERUM: 0.4 ng/mL (ref 0.0–4.0)

## 2016-09-15 ENCOUNTER — Ambulatory Visit: Payer: PPO | Attending: Family Medicine

## 2016-09-15 ENCOUNTER — Other Ambulatory Visit (HOSPITAL_COMMUNITY): Payer: PPO

## 2016-09-15 DIAGNOSIS — I700 Atherosclerosis of aorta: Secondary | ICD-10-CM

## 2016-09-15 NOTE — Telephone Encounter (Signed)
CMA call regarding lab results   Patient Verify DOB   Patient was aware and understood  

## 2016-09-15 NOTE — Progress Notes (Signed)
Patient here for lab visit only 

## 2016-09-15 NOTE — Telephone Encounter (Signed)
-----   Message from Lizbeth BarkMandesia R Hairston, FNP sent at 09/15/2016  1:24 PM EDT ----- PSA is normal. PSA is screens for prostate problems. PSA level can be  elevated with prostate enlargement or prostate cancer.  Follow up with general surgery referral.

## 2016-09-16 ENCOUNTER — Encounter (INDEPENDENT_AMBULATORY_CARE_PROVIDER_SITE_OTHER): Payer: Self-pay | Admitting: *Deleted

## 2016-09-16 LAB — CMP14+EGFR
ALT: 62 IU/L — AB (ref 0–44)
AST: 31 IU/L (ref 0–40)
Albumin/Globulin Ratio: 1.8 (ref 1.2–2.2)
Albumin: 4.5 g/dL (ref 3.5–5.5)
Alkaline Phosphatase: 82 IU/L (ref 39–117)
BILIRUBIN TOTAL: 0.3 mg/dL (ref 0.0–1.2)
BUN/Creatinine Ratio: 12 (ref 9–20)
BUN: 11 mg/dL (ref 6–24)
CHLORIDE: 100 mmol/L (ref 96–106)
CO2: 25 mmol/L (ref 18–29)
Calcium: 9.9 mg/dL (ref 8.7–10.2)
Creatinine, Ser: 0.89 mg/dL (ref 0.76–1.27)
GFR calc Af Amer: 120 mL/min/{1.73_m2} (ref >59)
GFR calc non Af Amer: 104 mL/min/{1.73_m2} (ref >59)
GLOBULIN, TOTAL: 2.5 g/dL (ref 1.5–4.5)
Glucose: 153 mg/dL — ABNORMAL HIGH (ref 65–99)
POTASSIUM: 4.5 mmol/L (ref 3.5–5.2)
SODIUM: 141 mmol/L (ref 134–144)
Total Protein: 7 g/dL (ref 6.0–8.5)

## 2016-09-16 LAB — LIPID PANEL
CHOL/HDL RATIO: 5.5 ratio — AB (ref 0.0–5.0)
CHOLESTEROL TOTAL: 210 mg/dL — AB (ref 100–199)
HDL: 38 mg/dL — AB (ref >39)
LDL CALC: 142 mg/dL — AB (ref 0–99)
TRIGLYCERIDES: 149 mg/dL (ref 0–149)
VLDL CHOLESTEROL CAL: 30 mg/dL (ref 5–40)

## 2016-09-16 NOTE — Telephone Encounter (Signed)
Tried calling pt again, left message to return call, also sent letter to pt with appt information.

## 2016-09-22 ENCOUNTER — Telehealth: Payer: Self-pay

## 2016-09-22 ENCOUNTER — Other Ambulatory Visit: Payer: Self-pay | Admitting: Family Medicine

## 2016-09-22 ENCOUNTER — Ambulatory Visit: Payer: PPO | Attending: Family Medicine | Admitting: Rehabilitative and Restorative Service Providers"

## 2016-09-22 ENCOUNTER — Ambulatory Visit (HOSPITAL_COMMUNITY)
Admission: RE | Admit: 2016-09-22 | Discharge: 2016-09-22 | Disposition: A | Discharge status: Home / Self Care | Payer: PPO | Source: Ambulatory Visit | Attending: Orthopaedic Surgery | Admitting: Orthopaedic Surgery

## 2016-09-22 DIAGNOSIS — G8929 Other chronic pain: Secondary | ICD-10-CM | POA: Diagnosis not present

## 2016-09-22 DIAGNOSIS — E782 Mixed hyperlipidemia: Secondary | ICD-10-CM

## 2016-09-22 DIAGNOSIS — M542 Cervicalgia: Secondary | ICD-10-CM

## 2016-09-22 DIAGNOSIS — R293 Abnormal posture: Secondary | ICD-10-CM | POA: Diagnosis not present

## 2016-09-22 DIAGNOSIS — M47892 Other spondylosis, cervical region: Secondary | ICD-10-CM | POA: Insufficient documentation

## 2016-09-22 DIAGNOSIS — M6281 Muscle weakness (generalized): Secondary | ICD-10-CM | POA: Diagnosis not present

## 2016-09-22 DIAGNOSIS — M5441 Lumbago with sciatica, right side: Secondary | ICD-10-CM | POA: Insufficient documentation

## 2016-09-22 MED ORDER — PRAVASTATIN SODIUM 20 MG PO TABS: 20.0000 mg | ORAL_TABLET | Freq: Every day | ORAL | 2 refills | Status: DC

## 2016-09-22 NOTE — Telephone Encounter (Signed)
-----   Message from Lizbeth BarkMandesia R Hairston, FNP sent at 09/22/2016 11:59 AM EDT ----- -Lipid levels were elevated. This can increase your risk of heart disease. You will be prescribed pravastatin to help lower risk. Start eating a diet low in saturated fat. Limit your intake of fried foods, red meats, and whole milk.  Kidney function normal Liver function normal Recommend follow up in 3 months.

## 2016-09-22 NOTE — Patient Instructions (Addendum)
Discussed with pt how to perform log roll technique and how log roll decreases spinal twisting potential; discussed how tension increases pain and alters postural alignment. Advised pt to consult MD regarding change in med/dosage for pain.

## 2016-09-22 NOTE — Telephone Encounter (Signed)
CMA call regarding lab results   Patient Verify DOB   Patient was aware and understood  

## 2016-09-22 NOTE — Therapy (Addendum)
[facility] [address] [place], [place], [address] Phone: [phone]   Fax:  [phone]  Physical Therapy Evaluation  Patient Details  Name: Joshua [name] MRN: [id number] Date of Birth: 06/23/1970 Referring Provider: Arrie Senate  Encounter Date: 09/22/2016      PT End of Session - 09/22/16 1639    Visit Number 1   Number of Visits 24   Date for PT Re-Evaluation 11/17/16   PT Start Time 1504   PT Stop Time 1614   PT Time Calculation (min) 70 min   Activity Tolerance Patient tolerated treatment well;Patient limited by pain   Behavior During Therapy [facility] for tasks assessed/performed;Anxious      History reviewed. No pertinent past medical history.  History reviewed. No pertinent surgical history.  There were no vitals filed for this visit.       Subjective Assessment - 09/22/16 1508    Subjective Pt reports full spinal pain since 2008. Pt reports cervical and lumbar pain with R LE radicular sxs extending to foot. Pt has bil cervical pain which extends to bil UEs and into hands L > R. Pt recently had lumbar MRI 09/06/16 and cervical MRI today.    Patient is accompained by: Family member;Interpreter   Pertinent History Pt received [place] in [place] only 1 week and was for worker's comp in 2008. pt received 3 injections in lumbar and 2 cervical in [place] 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 Pt has only received meds since 03/2016.    Limitations Sitting;Lifting;Standing;Walking;House hold activities   How long can you sit comfortably? 5 min   How long can you stand comfortably? 4-6 min   How long can you walk comfortably? 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    Patient Stated Goals to get better and be able to hold my baby   Currently in Pain? Yes   Pain Score 6    Pain  Location Back   Pain Orientation Right   Pain Descriptors / Indicators Burning;Shooting;Sore   Pain Type Chronic pain   Pain Radiating Towards LBP radiates to R foot   Pain Onset More than a month ago   Pain Frequency Constant   Aggravating Factors  any exertion such as picking up a garbage can    Pain Relieving Factors meds   Multiple Pain Sites Yes   Pain Score 4   Pain Location Neck   Pain Orientation Left   Pain Descriptors / Indicators Burning   Pain Type Chronic pain   Pain Radiating Towards bil  UEs L > R   Pain Onset More than a month ago   Pain Frequency Intermittent   Aggravating Factors  unable to state   Pain Relieving Factors meds   Effect of Pain on Daily Activities unable to hold 28 month old daughter            [facility] PT Assessment - 09/22/16 0001      Assessment   Medical Diagnosis LBP with R sciatica   Referring Provider Arrie Senate   Onset Date/Surgical Date --  2008 and 2011   Hand Dominance Right   Next MD Visit 11/17/16     Precautions   Precautions None     Restrictions   Weight Bearing Restrictions No     Balance Screen   Has the patient fallen in  the past 6 months No     Home [employer] information [employer] residence   Living Arrangements Spouse/significant other     Prior Function   Level of Independence Independent with basic ADLs     Cognition   Overall Cognitive Status Within Functional Limits for tasks assessed     Coordination   Gross Motor Movements are Fluid and Coordinated No  all are hesitant and with decreased coordination due to pain   Fine Motor Movements are Fluid and Coordinated No     Functional Tests   Functional tests Squat     Squat   Comments --  unable to do symmetrically with minimal ROM due to pain     Posture/Postural Control   Posture Comments rounded shoulders L shoulder depressed, forward head     ROM / Strength   AROM / PROM / Strength AROM;Strength     AROM   Overall AROM  Comments cervical AROM limited L rot 75% and R rot 50%, ext 80%; lumbar AROM limited flexion 75%, bil sidebending 25%, ext 100% with extension being most painful.; shoulder AROM WNL but with pain L > R     Strength   Overall Strength Comments bil LE strength 3+/5 with pain with all MMT     Flexibility   Soft Tissue Assessment /Muscle Length yes   Hamstrings unable to perform due to hamstirng tightness and pain   Piriformis tight bil and with </= 25% IR stretch     Palpation   Spinal mobility unable to assess due to high irritability of pain   Palpation comment pt with minimal tightness to bil Piriformis; however, with palpation, pt reports increased radicular sxs extending to feet, pt with increased tightness to R upper thoracic and L lower thoracic extending into lumbar region; R scapula depressed; L cervical rests in lateral flexion. ilium =     Special Tests    Special Tests Lumbar;Hip Special Tests  womac 1/96   Lumbar Tests Slump Test;Straight Leg Raise   Hip Special Tests  Thomas Test     Slump test   Findings Positive   Side Right   Comment --  L side also     Straight Leg Raise   Findings Positive   Side  --  bil to feet     [employer]    Findings Positive   Side --  both sides     Bed Mobility   Bed Mobility --  difficulty with repositioning and rolling due to pain     Ambulation/Gait   Gait Comments amb with cane with decreased coordination and full body tightness            Objective measurements completed on examination: See above findings.                    PT Short Term Goals - 09/22/16 1630      PT SHORT TERM GOAL #1   Title Pt will be I with initial HEP to assist with pain management   Baseline not issued at eval    Time 4   Period Weeks   Status New     PT SHORT TERM GOAL #2   Title Pt will demo improved lumbar flexion to 50% present to assist with reaching toward floor   Baseline 75% limited   Time 4   Period Weeks    Status New     PT SHORT TERM GOAL #3  Title Pt will demo improved lumbar ext to 50% present to assist with iADLs   Baseline 100% limited   Time 4   Period Weeks   Status New     PT SHORT TERM GOAL #4   Title Pt will report improved R LE radicular sxs x 25% to assist with more steadiness with ambulation   Baseline present 100%   Time 4   Period Weeks   Status New           PT Long Term Goals - 09/22/16 1635      PT LONG TERM GOAL #1   Title Pt will be able to sit > 30 min to eat dinner with decreased pain   Baseline 5 min   Time 8   Period Weeks   Status New     PT LONG TERM GOAL #2   Title Pt will be able to stand x 15 min to assist with household activities/shopping   Baseline 4-6 min   Time 8   Period Weeks   Status New     PT LONG TERM GOAL #3   Title Pt will report improved LBP to </= 4/10 at all times with functional mobility   Baseline up to 8/10   Time 8   Period Weeks   Status New     PT LONG TERM GOAL #4   Title Pt will demo improved supine to sit transfers with </= 2/10 pain for getting out of bed   Baseline 6/10   Time 8   Period Weeks   Status New     PT LONG TERM GOAL #5   Title Pt will be able to hold small daughter x 5 min with </= 4/10 LBP/R sciatica   Baseline unable   Time 8   Period Weeks   Status New                Plan - 09/22/16 1622    Clinical Impression Statement Pt presents to PT with bil cervical L > R and bil LBP R > L with R LE radicular sxs (however, no cervical script provided). Pt is with high tension which worsens already poor posture. Pt has decreased bil LE strength and uses a cane for balance. Spinal mobility was unable to be assessed due to pt tension and hypersensitivity to pain. MRI shows mild disc impairment at L5-S1 junction with cervical MRI to be performed after therapy evaluation. pt would benefit from PT for R LE neural glides, lumbar flexibility and pain management, postural correction via manual  therapy, education and exercise, and modalities to improve pain. Pt hypersensitive to pain and nervous about PT.    History and Personal Factors relevant to plan of care: pt hypersensitive to pain, decreased mobility with all movement, emotional, having chest pain but refuses to tell MD; encouraged pt to notify MD.   Clinical Presentation Unstable   Clinical Presentation due to: due to pain; multiple symptoms, will need to closely monitor pain   Clinical Decision Making Moderate   Rehab Potential Good   Clinical Impairments Affecting Rehab Potential pain   PT Frequency 3x / week   PT Duration 8 weeks   PT Treatment/Interventions ADLs/Self Care Home Management;Electrical Stimulation;Moist Heat;Traction;Ultrasound;Therapeutic exercise;Therapeutic activities;Functional mobility training;Gait training;Patient/family education;Manual techniques;Taping;Dry needling   PT Next Visit Plan issue HEP, practice log roll, see if he phoned MD regarding chest pain and med dosage, do traction in next couple of visits, focus on neural glides to R LE  specifically, pain management techniques to R LBP/R sciatica, core stabilization; treat neck and add neck goal if pt brings script for neck pain   PT Home Exercise Plan see pt instructions, no formalized HEP issued   Recommended Other Services RETURN TO MD REGARDING CHEST PAIN   Consulted and Agree with Plan of Care Patient      Patient will benefit from skilled therapeutic intervention in order to improve the following deficits and impairments:  Abnormal gait, Decreased activity tolerance, Decreased coordination, Decreased range of motion, Decreased mobility, Decreased strength, Difficulty walking, Impaired flexibility, Increased fascial restricitons, Hypomobility, Improper body mechanics, Postural dysfunction, Pain  Visit Diagnosis: Chronic right-sided low back pain with right-sided sciatica - Plan: PT plan of care cert/re-cert  Muscle weakness (generalized) -  Plan: PT plan of care cert/re-cert  Abnormal posture - Plan: PT plan of care cert/re-cert  Cervicalgia - Plan: PT plan of care cert/re-cert   Late entry G-code: Mobility; Current status CM; Goal status: CL  Problem List Patient Active Problem List   Diagnosis Date Noted  . Chronic bilateral low back pain with right-sided sciatica 09/12/2016  . History of major depression 09/12/2016  . Inguinal hernia of right side without obstruction or gangrene 09/12/2016    Thornell SartoriusARTIS,Kory Rains, PT 09/22/2016, 4:46 PM  [facility] [address] [place], [place], [address] Phone: [phone]   Fax:  [phone]  Name: Joshua [name] MRN: [id number] Date of Birth: 12-02-1970

## 2016-09-30 ENCOUNTER — Encounter: Payer: Self-pay | Admitting: Physical Therapy

## 2016-09-30 ENCOUNTER — Ambulatory Visit: Payer: PPO | Admitting: Physical Therapy

## 2016-09-30 DIAGNOSIS — M5441 Lumbago with sciatica, right side: Principal | ICD-10-CM

## 2016-09-30 DIAGNOSIS — M6281 Muscle weakness (generalized): Secondary | ICD-10-CM

## 2016-09-30 DIAGNOSIS — R293 Abnormal posture: Secondary | ICD-10-CM

## 2016-09-30 DIAGNOSIS — M542 Cervicalgia: Secondary | ICD-10-CM

## 2016-09-30 DIAGNOSIS — G8929 Other chronic pain: Secondary | ICD-10-CM

## 2016-09-30 NOTE — Therapy (Signed)
[facility] Outpatient Rehabilitation [facility] [address] [place], [place], [address] Phone: [phone]   Fax:  [phone]  Physical Therapy Treatment  Patient Details  Name: Joshua [name] MRN: [id number] Date of Birth: [birth year] Referring Provider: Arrie Senate  Encounter Date: 09/30/2016      PT End of Session - 09/30/16 1744    Visit Number 2   Number of Visits 24   Date for PT Re-Evaluation 11/17/16   PT Start Time 1547   PT Stop Time 1645   PT Time Calculation (min) 58 min   Activity Tolerance Patient tolerated treatment well;Patient limited by pain   Behavior During Therapy [facility] for tasks assessed/performed;Anxious      History reviewed. No pertinent past medical history.  History reviewed. No pertinent surgical history.  There were no vitals filed for this visit.      Subjective Assessment - 09/30/16 1744    Subjective Pain unchanged except he is now having some left hip pain too.  Bed mobility continued to be painful, however It was noted to be done correctly.  patient umable to tloerate supine on his back today,  no relief with lumbar traction simulation so held traction for now.  Chest pain has resolved.  he did not return to [place].  He does not know the results of the recent MRI.   Patient is accompained by: Family member;Interpreter   Pertinent History Pt received [place] in [place] only 1 week and was for worker's comp in 2008. pt received 3 injections in lumbar and 2 cervical in [place] 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 Pt has only received meds since 03/2016.    Currently in Pain? Yes   Pain Score 6    Pain Location Back   Pain Orientation Right   Pain Descriptors / Indicators Sore;Shooting;Burning   Pain Type Chronic pain   Pain Frequency Constant   Aggravating Factors  ADL,  bending lifting   Pain Relieving Factors meds   Pain Score 4   Pain Location Neck   Pain Orientation Left   Pain  Descriptors / Indicators Burning   Pain Type Chronic pain   Pain Radiating Towards Both UE's L>R   Pain Onset More than a month ago   Pain Frequency Intermittent   Aggravating Factors  he demonstrated head movements   Pain Relieving Factors meds                         [facility] Adult PT Treatment/Exercise - 09/30/16 0001      Lumbar Exercises: Stretches   Prone on Elbows Stretch 1 rep;30 seconds   Prone on Elbows Stretch Limitations painful with 2 pillows     Lumbar Exercises: Supine   Bridge Limitations 1 X painful     Lumbar Exercises: Sidelying   Other Sidelying Lumbar Exercises QL stretching with pillow,    Other Sidelying Lumbar Exercises on side ankle pumps with AA hip and knee flexion for Neural  glides 10 bouts of 5 pumps.     Moist Heat Therapy   Number Minutes Moist Heat 15 Minutes   Moist Heat Location Lumbar Spine  side      Ultrasound   Ultrasound Location lumbar   Ultrasound Parameters 1.5 watts/cm2, continuous   Ultrasound Goals Pain  noted reduced pain     Manual Therapy   Manual Therapy Soft tissue mobilization   Manual therapy comments Sensitive to soft tissue work .  Prone long axis distraction 1 x each leg,  not helpful, increased pain  light work tolerated   Soft tissue mobilization Hips, lumbar paraspinals                  PT Short Term Goals - 09/22/16 1630      PT SHORT TERM GOAL #1   Title Pt will be I with initial HEP to assist with pain management   Baseline not issued at eval    Time 4   Period Weeks   Status New     PT SHORT TERM GOAL #2   Title Pt will demo improved lumbar flexion to 50% present to assist with reaching toward floor   Baseline 75% limited   Time 4   Period Weeks   Status New     PT SHORT TERM GOAL #3   Title Pt will demo improved lumbar ext to 50% present to assist with iADLs   Baseline 100% limited   Time 4   Period Weeks   Status New     PT SHORT TERM GOAL #4   Title Pt will report  improved R LE radicular sxs x 25% to assist with more steadiness with ambulation   Baseline present 100%   Time 4   Period Weeks   Status New           PT Long Term Goals - 09/22/16 1635      PT LONG TERM GOAL #1   Title Pt will be able to sit > 30 min to eat dinner with decreased pain   Baseline 5 min   Time 8   Period Weeks   Status New     PT LONG TERM GOAL #2   Title Pt will be able to stand x 15 min to assist with household activities/shopping   Baseline 4-6 min   Time 8   Period Weeks   Status New     PT LONG TERM GOAL #3   Title Pt will report improved LBP to </= 4/10 at all times with functional mobility   Baseline up to 8/10   Time 8   Period Weeks   Status New     PT LONG TERM GOAL #4   Title Pt will demo improved supine to sit transfers with </= 2/10 pain for getting out of bed   Baseline 6/10   Time 8   Period Weeks   Status New     PT LONG TERM GOAL #5   Title Pt will be able to hold small daughter x 5 min with </= 4/10 LBP/R sciatica   Baseline unable   Time 8   Period Weeks   Status New             Patient will benefit from skilled therapeutic intervention in order to improve the following deficits and impairments:     Visit Diagnosis: Chronic right-sided low back pain with right-sided sciatica  Muscle weakness (generalized)  Abnormal posture  Cervicalgia     Problem List Patient Active Problem List   Diagnosis Date Noted  . Chronic bilateral low back pain with right-sided sciatica 09/12/2016  . History of major depression 09/12/2016  . Inguinal hernia of right side without obstruction or gangrene 09/12/2016    Tiney Zipper PTA 09/30/2016, 5:52 PM  [facility] [address] [place], [place], [address] Phone: [phone]   Fax:  [phone]  Name: Joshua [name] MRN: [id number] Date of Birth: 08-07-1970

## 2016-10-01 ENCOUNTER — Ambulatory Visit: Payer: PPO | Admitting: Physical Therapy

## 2016-10-01 DIAGNOSIS — M6281 Muscle weakness (generalized): Secondary | ICD-10-CM

## 2016-10-01 DIAGNOSIS — M542 Cervicalgia: Secondary | ICD-10-CM

## 2016-10-01 DIAGNOSIS — R293 Abnormal posture: Secondary | ICD-10-CM

## 2016-10-01 DIAGNOSIS — M5441 Lumbago with sciatica, right side: Principal | ICD-10-CM

## 2016-10-01 DIAGNOSIS — G8929 Other chronic pain: Secondary | ICD-10-CM

## 2016-10-01 NOTE — Therapy (Signed)
[facility] [address] [place], [place], [address] Phone: [phone]   Fax:  [phone]  Physical Therapy Treatment  Patient Details  Name: Joshua [name] MRN: [id number] Date of Birth: May 12, 1970 Referring Provider: Arrie SenateHairston, [place]  Encounter Date: 10/01/2016      PT End of Session - 10/01/16 1732    Visit Number 3   Number of Visits 24   Date for PT Re-Evaluation 11/17/16   PT Start Time 1630   PT Stop Time 1730   PT Time Calculation (min) 60 min   Activity Tolerance Patient tolerated treatment well;Patient limited by pain   Behavior [facility] Therapy [facility] for tasks assessed/performed      No past medical history on file.  No past surgical history on file.  There were no vitals filed for this visit.      Subjective Assessment - 10/01/16 1636    Subjective 5/10 .  PT helped.  had wife do massage at home.     Currently in Pain? Yes   Pain Score 6    Pain Location Back   Pain Orientation Right                         [facility] Adult PT Treatment/Exercise - 10/01/16 0001      Lumbar Exercises: Stretches   Prone on Elbows Stretch Limitations did not try     Lumbar Exercises: Supine   Other Supine Lumbar Exercises illiopsoas stretch 3 x 30 seconds (Thomas type) after illiopsoas release.    90/90 hips, knees     Lumbar Exercises: Sidelying   Other Sidelying Lumbar Exercises isometric hip extension left 4 X 10 seconds.   Other Sidelying Lumbar Exercises on side ankle pumps with AA hip and knee flexion for Neural  glides 10 bouts of 5 pumps.     Moist Heat Therapy   Number Minutes Moist Heat 5 Minutes   Moist Heat Location Lumbar Spine  on side, pillow between knees     Ultrasound   Ultrasound Location lumbar   Ultrasound Parameters 1.5 watts /cm2   Ultrasound Goals Pain     Manual Therapy   Manual Therapy Soft tissue mobilization   Manual therapy comments strumming, trigger point release  to illiopsoas left.  very irritated   Soft tissue mobilization Hips, lumbar paraspinals  trigger point release,  instrument assist intermittant.                    PT Short Term Goals - 09/22/16 1630      PT SHORT TERM GOAL #1   Title Pt will be I with initial HEP to assist with pain management   Baseline not issued at eval    Time 4   Period Weeks   Status New     PT SHORT TERM GOAL #2   Title Pt will demo improved lumbar flexion to 50% present to assist with reaching toward floor   Baseline 75% limited   Time 4   Period Weeks   Status New     PT SHORT TERM GOAL #3   Title Pt will demo improved lumbar ext to 50% present to assist with iADLs   Baseline 100% limited   Time 4   Period Weeks   Status New     PT SHORT TERM GOAL #4   Title Pt will report improved R LE radicular sxs x 25% to assist with more steadiness with ambulation  Baseline present 100%   Time 4   Period Weeks   Status New           PT Long Term Goals - 09/22/16 1635      PT LONG TERM GOAL #1   Title Pt will be able to sit > 30 min to eat dinner with decreased pain   Baseline 5 min   Time 8   Period Weeks   Status New     PT LONG TERM GOAL #2   Title Pt will be able to stand x 15 min to assist with household activities/shopping   Baseline 4-6 min   Time 8   Period Weeks   Status New     PT LONG TERM GOAL #3   Title Pt will report improved LBP to </= 4/10 at all times with functional mobility   Baseline up to 8/10   Time 8   Period Weeks   Status New     PT LONG TERM GOAL #4   Title Pt will demo improved supine to sit transfers with </= 2/10 pain for getting out of bed   Baseline 6/10   Time 8   Period Weeks   Status New     PT LONG TERM GOAL #5   Title Pt will be able to hold small daughter x 5 min with </= 4/10 LBP/R sciatica   Baseline unable   Time 8   Period Weeks   Status New               Plan - 10/01/16 1637    Clinical Impression Statement Pain  increased at end of session today.  Patient was hyper sensitive with manual and exercises.  7/10 at end of session.  patient moved slow and guarded.  May benigit from trial of light IFC (He had too strong IFC in the past in [place]   Clinical Impairments Affecting Rehab Potential pain   PT Treatment/Interventions ADLs/Self Care Home Management;Electrical Stimulation;Moist Heat;Traction;Ultrasound;Therapeutic exercise;Therapeutic activities;Functional mobility training;Gait training;Patient/family education;Manual techniques;Taping;Dry needling   PT Next Visit Plan HEP if something helps. continue neural glides,  traction?   Consulted and Agree with Plan of Care Patient      Patient will benefit from skilled therapeutic intervention in order to improve the following deficits and impairments:  Abnormal gait, Decreased activity tolerance, Decreased coordination, Decreased range of motion, Decreased mobility, Decreased strength, Difficulty walking, Impaired flexibility, Increased fascial restricitons, Hypomobility, Improper body mechanics, Postural dysfunction, Pain  Visit Diagnosis: Chronic right-sided low back pain with right-sided sciatica  Muscle weakness (generalized)  Abnormal posture  Cervicalgia     Problem List Patient Active Problem List   Diagnosis Date Noted  . Chronic bilateral low back pain with right-sided sciatica 09/12/2016  . History of major depression 09/12/2016  . Inguinal hernia of right side without obstruction or gangrene 09/12/2016    HARRIS,KAREN PTA 10/01/2016, 5:37 PM  [facility] [address] [place], [place], [address] Phone: [phone]   Fax:  [phone]  Name: Joshua [name] MRN: [id number] Date of Birth: [birth year]

## 2016-10-01 NOTE — Therapy (Signed)
[facility] Outpatient Rehabilitation [facility] [address] [place], [place], [address] Phone: [phone]   Fax:  [phone]  Physical Therapy Treatment  Patient Details  Name: Joshua [name] MRN: [id number] Date of Birth: 04-24-1970 Referring Provider: Arrie Senate  Encounter Date: 10/01/2016      PT End of Session - 10/01/16 1732    Visit Number 3   Number of Visits 24   Date for PT Re-Evaluation 11/17/16   PT Start Time 1630   PT Stop Time 1730   PT Time Calculation (min) 60 min   Activity Tolerance Patient tolerated treatment well;Patient limited by pain   Behavior During Therapy [facility] for tasks assessed/performed      No past medical history on file.  No past surgical history on file.  There were no vitals filed for this visit.      Subjective Assessment - 10/01/16 1636    Subjective 5/10 .  PT helped.  had wife do massage at home.     Patient is accompained by: Family member;Interpreter  young son   Currently in Pain? Yes   Pain Score 6    Pain Location Back   Pain Orientation Right                         [facility] Adult PT Treatment/Exercise - 10/01/16 0001      Lumbar Exercises: Stretches   Prone on Elbows Stretch Limitations did not try     Lumbar Exercises: Supine   Other Supine Lumbar Exercises illiopsoas stretch 3 x 30 seconds (Thomas type) after illiopsoas release.    90/90 hips, knees     Lumbar Exercises: Sidelying   Other Sidelying Lumbar Exercises isometric hip extension left 4 X 10 seconds.   Other Sidelying Lumbar Exercises on side ankle pumps with AA hip and knee flexion for Neural  glides 10 bouts of 5 pumps.     Moist Heat Therapy   Number Minutes Moist Heat 5 Minutes   Moist Heat Location Lumbar Spine  on side, pillow between knees     Ultrasound   Ultrasound Location lumbar   Ultrasound Parameters 1.5 watts /cm2   Ultrasound Goals Pain     Manual Therapy   Manual Therapy Soft tissue  mobilization   Manual therapy comments strumming, trigger point release to illiopsoas left.  very irritated   Soft tissue mobilization Hips, lumbar paraspinals  trigger point release,  instrument assist intermittant.                    PT Short Term Goals - 09/22/16 1630      PT SHORT TERM GOAL #1   Title Pt will be I with initial HEP to assist with pain management   Baseline not issued at eval    Time 4   Period Weeks   Status New     PT SHORT TERM GOAL #2   Title Pt will demo improved lumbar flexion to 50% present to assist with reaching toward floor   Baseline 75% limited   Time 4   Period Weeks   Status New     PT SHORT TERM GOAL #3   Title Pt will demo improved lumbar ext to 50% present to assist with iADLs   Baseline 100% limited   Time 4   Period Weeks   Status New     PT SHORT TERM GOAL #4   Title Pt will report improved R LE radicular  sxs x 25% to assist with more steadiness with ambulation   Baseline present 100%   Time 4   Period Weeks   Status New           PT Long Term Goals - 09/22/16 1635      PT LONG TERM GOAL #1   Title Pt will be able to sit > 30 min to eat dinner with decreased pain   Baseline 5 min   Time 8   Period Weeks   Status New     PT LONG TERM GOAL #2   Title Pt will be able to stand x 15 min to assist with household activities/shopping   Baseline 4-6 min   Time 8   Period Weeks   Status New     PT LONG TERM GOAL #3   Title Pt will report improved LBP to </= 4/10 at all times with functional mobility   Baseline up to 8/10   Time 8   Period Weeks   Status New     PT LONG TERM GOAL #4   Title Pt will demo improved supine to sit transfers with </= 2/10 pain for getting out of bed   Baseline 6/10   Time 8   Period Weeks   Status New     PT LONG TERM GOAL #5   Title Pt will be able to hold small daughter x 5 min with </= 4/10 LBP/R sciatica   Baseline unable   Time 8   Period Weeks   Status New                Plan - 10/01/16 1637    Clinical Impression Statement Pain increased at end of session today.  Patient was hyper sensitive with manual and exercises.  7/10 at end of session.  patient moved slow and guarded.  May benigit from trial of light IFC (He had too strong IFC in the past in [place]   Clinical Impairments Affecting Rehab Potential pain   PT Treatment/Interventions ADLs/Self Care Home Management;Electrical Stimulation;Moist Heat;Traction;Ultrasound;Therapeutic exercise;Therapeutic activities;Functional mobility training;Gait training;Patient/family education;Manual techniques;Taping;Dry needling   PT Next Visit Plan HEP if something helps. continue neural glides,  traction?   Consulted and Agree with Plan of Care Patient      Patient will benefit from skilled therapeutic intervention in order to improve the following deficits and impairments:  Abnormal gait, Decreased activity tolerance, Decreased coordination, Decreased range of motion, Decreased mobility, Decreased strength, Difficulty walking, Impaired flexibility, Increased fascial restricitons, Hypomobility, Improper body mechanics, Postural dysfunction, Pain  Visit Diagnosis: Chronic right-sided low back pain with right-sided sciatica  Muscle weakness (generalized)  Abnormal posture  Cervicalgia     Problem List Patient Active Problem List   Diagnosis Date Noted  . Chronic bilateral low back pain with right-sided sciatica 09/12/2016  . History of major depression 09/12/2016  . Inguinal hernia of right side without obstruction or gangrene 09/12/2016    Joshua [name] 10/01/2016, 5:54 PM  [facility] [address] [place], [place], [address] Phone: [phone]   Fax:  [phone]  Name: Joshua [name] MRN: [id number] Date of Birth: [birth year]

## 2016-10-06 ENCOUNTER — Encounter: Payer: Self-pay | Admitting: Physical Therapy

## 2016-10-06 ENCOUNTER — Ambulatory Visit: Payer: PPO | Admitting: Physical Therapy

## 2016-10-06 DIAGNOSIS — M6281 Muscle weakness (generalized): Secondary | ICD-10-CM

## 2016-10-06 DIAGNOSIS — M5441 Lumbago with sciatica, right side: Secondary | ICD-10-CM | POA: Diagnosis not present

## 2016-10-06 DIAGNOSIS — R293 Abnormal posture: Secondary | ICD-10-CM

## 2016-10-06 DIAGNOSIS — G8929 Other chronic pain: Secondary | ICD-10-CM

## 2016-10-06 DIAGNOSIS — M542 Cervicalgia: Secondary | ICD-10-CM

## 2016-10-06 NOTE — Therapy (Signed)
[facility] [address] [place], [place], [address] Phone: [phone]   Fax:  [phone]  Physical Therapy Treatment  Patient Details  Name: Joshua RucksSantiago [name] MRN: [id number] Date of Birth: [birth year] Referring Provider: Arrie SenateHairston, Mandesia  Encounter Date: 10/06/2016      PT End of Session - 10/06/16 1704    Visit Number 4   Number of Visits 24   Date for PT Re-Evaluation 11/17/16   PT Start Time 1547   PT Stop Time 1630   PT Time Calculation (min) 43 min   Activity Tolerance Patient tolerated treatment well   Behavior [facility] Therapy [facility] for tasks assessed/performed      History reviewed. No pertinent past medical history.  History reviewed. No pertinent surgical history.  There were no vitals filed for this visit.      Subjective Assessment - 10/06/16 1552    Subjective 5/10.  I wa sore after last session.  Now I am OK.   Patient is accompained by: Interpreter   Currently in Pain? Yes   Pain Score 5    Pain Location Back   Pain Orientation Right   Pain Descriptors / Indicators Sore;Burning;Cramping   Pain Type Chronic pain   Pain Radiating Towards foot has cramps at night   Pain Frequency Constant   Aggravating Factors  moving a certain way, bending ,    Pain Relieving Factors massage   Effect of Pain on Daily Activities limits ADL, Extra time required,   Pain Score 0   Pain Location Neck   Pain Orientation Left   Pain Descriptors / Indicators Burning   Pain Type Chronic pain   Pain Radiating Towards Both UE's LT/ RT   Pain Frequency Intermittent   Aggravating Factors  moving head, too many pillows   Pain Relieving Factors meds   Effect of Pain on Daily Activities unable to hold 976 month old daughter.                          [facility] Adult PT Treatment/Exercise - 10/06/16 0001      Lumbar Exercises: Stretches   Passive Hamstring Stretch 2 reps;20 seconds  did not tolerate     Lumbar  Exercises: Supine   Glut Set 10 reps   Bridge Limitations 1 rep slow and guarded   Straight Leg Raise 10 reps;Limitations   Straight Leg Raises Limitations left only   Isometric Hip Flexion 5 reps  painful, left only, a littlt   Other Supine Lumbar Exercises fist squeeze 10 x 5 seconds,     Lumbar Exercises: Sidelying   Other Sidelying Lumbar Exercises QL stretches with gentle rib, or hip pressure.  Slight decrease of pain noted.      Modalities   Modalities Electrical Stimulation     Moist Heat Therapy   Number Minutes Moist Heat 15 Minutes   Moist Heat Location Lumbar Spine     Electrical Stimulation   Electrical Stimulation Location left low back  while stretching QL   Electrical Stimulation Action IFC   Electrical Stimulation Parameters 6  I let patient turn it up due to bad experience with E-Stim    Electrical Stimulation Goals Pain     Traction   Type of Traction Lumbar   Min (lbs) 0   Max (lbs) set at 50, turned off when he got to 25 .  When asked how he was doing he said his pain was 10/10.  There  were no physical signs of pain like a grimace ets.     Hold Time protoclo for muscle guarding with disc involvement. pre set   Rest Time pre set   Time N/A                  PT Short Term Goals - 10/06/16 1721      PT SHORT TERM GOAL #1   Title Pt will be I with initial HEP to assist with pain management   Baseline not issued   Time 4   Period Weeks   Status On-going     PT SHORT TERM GOAL #2   Title Pt will demo improved lumbar flexion to 50% present to assist with reaching toward floor   Time 4   Period Weeks   Status Unable to assess     PT SHORT TERM GOAL #3   Title Pt will demo improved lumbar ext to 50% present to assist with iADLs   Baseline no changes yet   Time 4   Period Weeks   Status On-going     PT SHORT TERM GOAL #4   Title Pt will report improved R LE radicular sxs x 25% to assist with more steadiness with ambulation   Baseline no  leg pain today,  Had cramps in foot last night   Time 4   Period Weeks   Status On-going           PT Long Term Goals - 09/22/16 1635      PT LONG TERM GOAL #1   Title Pt will be able to sit > 30 min to eat dinner with decreased pain   Baseline 5 min   Time 8   Period Weeks   Status New     PT LONG TERM GOAL #2   Title Pt will be able to stand x 15 min to assist with household activities/shopping   Baseline 4-6 min   Time 8   Period Weeks   Status New     PT LONG TERM GOAL #3   Title Pt will report improved LBP to </= 4/10 at all times with functional mobility   Baseline up to 8/10   Time 8   Period Weeks   Status New     PT LONG TERM GOAL #4   Title Pt will demo improved supine to sit transfers with </= 2/10 pain for getting out of bed   Baseline 6/10   Time 8   Period Weeks   Status New     PT LONG TERM GOAL #5   Title Pt will be able to hold small daughter x 5 min with </= 4/10 LBP/R sciatica   Baseline unable   Time 8   Period Weeks   Status New               Plan - 10/06/16 1704    Clinical Impression Statement Pain continues.  Patient noted his knee buckeled in church and it almost made me fall.  Trial of traction pain increased. so it was stopped.  Patient presented with left lumbar >Right today ( different )  Trial if exercises did not help.    Clinical Impairments Affecting Rehab Potential pain   PT Treatment/Interventions ADLs/Self Care Home Management;Electrical Stimulation;Moist Heat;Traction;Ultrasound;Therapeutic exercise;Therapeutic activities;Functional mobility training;Gait training;Patient/family education;Manual techniques;Taping;Dry needling   PT Next Visit Plan Check for SI issues,  DN? if appropriate.  We talked about it a little today.  He does not  have any home exercises.  Find some he can do    PT Home Exercise Plan no formal   Consulted and Agree with Plan of Care Patient      Patient will benefit from skilled therapeutic  intervention in order to improve the following deficits and impairments:  Abnormal gait, Decreased activity tolerance, Decreased coordination, Decreased range of motion, Decreased mobility, Decreased strength, Difficulty walking, Impaired flexibility, Increased fascial restricitons, Hypomobility, Improper body mechanics, Postural dysfunction, Pain  Visit Diagnosis: Chronic right-sided low back pain with right-sided sciatica  Muscle weakness (generalized)  Abnormal posture  Cervicalgia     Problem List Patient Active Problem List   Diagnosis Date Noted  . Chronic bilateral low back pain with right-sided sciatica 09/12/2016  . History of major depression 09/12/2016  . Inguinal hernia of right side without obstruction or gangrene 09/12/2016    Minsa Weddington PTA 10/06/2016, 5:27 PM  [facility] [address] [place], [place], [address] Phone: [phone]   Fax:  [phone]  Name: Almon Whitford MRN: [id number] Date of Birth: 1970/10/29

## 2016-10-07 ENCOUNTER — Ambulatory Visit (INDEPENDENT_AMBULATORY_CARE_PROVIDER_SITE_OTHER): Payer: PPO | Admitting: Orthopaedic Surgery

## 2016-10-08 ENCOUNTER — Encounter: Payer: Self-pay | Admitting: Physical Therapy

## 2016-10-08 ENCOUNTER — Ambulatory Visit: Payer: PPO | Admitting: Physical Therapy

## 2016-10-08 DIAGNOSIS — R293 Abnormal posture: Secondary | ICD-10-CM

## 2016-10-08 DIAGNOSIS — M5441 Lumbago with sciatica, right side: Secondary | ICD-10-CM | POA: Diagnosis not present

## 2016-10-08 DIAGNOSIS — M542 Cervicalgia: Secondary | ICD-10-CM

## 2016-10-08 DIAGNOSIS — G8929 Other chronic pain: Secondary | ICD-10-CM

## 2016-10-08 DIAGNOSIS — M6281 Muscle weakness (generalized): Secondary | ICD-10-CM

## 2016-10-08 NOTE — Therapy (Signed)
[place], [place], [address] Phone: [phone]   Fax:  [phone]  Physical Therapy Treatment  Patient Details  Name: Joshua [name] MRN: [id number] Date of Birth: February 20, 1971 Referring Provider: Fredia Beets  Encounter Date: 10/08/2016      PT End of Session - 10/08/16 1741    Visit Number 5   Number of Visits 24   Date for PT Re-Evaluation 11/17/16   PT Start Time 5427   PT Stop Time 1728   PT Time Calculation (min) 57 min   Activity Tolerance Patient tolerated treatment well   Behavior During Therapy [facility] for tasks assessed/performed      History reviewed. No pertinent past medical history.  History reviewed. No pertinent surgical history.  There were no vitals filed for this visit.      Subjective Assessment - 10/08/16 1633    Subjective "feeling about the same"   Currently in Pain? Yes   Pain Score 6    Pain Location Back   Pain Orientation Right   Aggravating Factors  using the traction machine,    Pain Relieving Factors massage, medication,                          [facility] Adult PT Treatment/Exercise - 10/08/16 1741      Lumbar Exercises: Stretches   Active Hamstring Stretch 4 reps;30 seconds  contract/ relaxe with 10 sec contraction   Lower Trunk Rotation --  1 x 15,    Lower Trunk Rotation Limitations cues to stay within pain free range     Lumbar Exercises: Supine   Bent Knee Raise 10 reps;Other (comment)  verbal cues for ADIM   Straight Leg Raise 10 reps  x 2 sets     Moist Heat Therapy   Number Minutes Moist Heat 15 Minutes  R lumbar spine in L sidelying     Manual Therapy   Manual Therapy Joint mobilization;Muscle Energy Technique   Manual therapy comments manual trigger point release over the R lumbar paraspinals   Joint Mobilization Long axis distraction grade 3 on RLE, anterior innominate mobs Grade 3 mobs while pt was on MHP    Soft  tissue mobilization IASTM over the R lumbar paraspinals   Muscle Energy Technique L sidelying resisted R hip flexion with l innominated grade 3 anterior mob 5 x 8 sec hold,    while MHP was on pt for low back                PT Education - 10/08/16 1740    Education provided Yes   Education Details educated about SIJ and effects of muscles on the SIJ and effect on paraspinal muscles. updated HEP  extra time needed for interpretation   Person(s) Educated Patient   Methods Explanation;Handout;Verbal cues  used spine model   Comprehension Verbalized understanding;Verbal cues required          PT Short Term Goals - 10/06/16 1721      PT SHORT TERM GOAL #1   Title Pt will be I with initial HEP to assist with pain management   Baseline not issued   Time 4   Period Weeks   Status On-going     PT SHORT TERM GOAL #2   Title Pt will demo improved lumbar flexion to 50% present to assist with reaching toward floor   Time 4   Period Weeks   Status Unable to assess  PT SHORT TERM GOAL #3   Title Pt will demo improved lumbar ext to 50% present to assist with iADLs   Baseline no changes yet   Time 4   Period Weeks   Status On-going     PT SHORT TERM GOAL #4   Title Pt will report improved R LE radicular sxs x 25% to assist with more steadiness with ambulation   Baseline no leg pain today,  Had cramps in foot last night   Time 4   Period Weeks   Status On-going           PT Long Term Goals - 09/22/16 1635      PT LONG TERM GOAL #1   Title Pt will be able to sit > 30 min to eat dinner with decreased pain   Baseline 5 min   Time 8   Period Weeks   Status New     PT LONG TERM GOAL #2   Title Pt will be able to stand x 15 min to assist with household activities/shopping   Baseline 4-6 min   Time 8   Period Weeks   Status New     PT LONG TERM GOAL #3   Title Pt will report improved LBP to </= 4/10 at all times with functional mobility   Baseline up to 8/10    Time 8   Period Weeks   Status New     PT LONG TERM GOAL #4   Title Pt will demo improved supine to sit transfers with </= 2/10 pain for getting out of bed   Baseline 6/10   Time 8   Period Weeks   Status New     PT LONG TERM GOAL #5   Title Pt will be able to hold small daughter x 5 min with </= 4/10 LBP/R sciatica   Baseline unable   Time 8   Period Weeks   Status New               Plan - 10/08/16 1747    Clinical Impression Statement pt demonstrates significant posterior rotated innominate on the R with bil paraspinal spasm R>L. focused on manual techniques to reduce R paraspinal spasm and hamstring tightnes, MET technqiues with resisted R hip flexor activation and mobilizations to push innominate back into anterior position. pt is compoliant with exericse but demonstrates significant guarding and pain through out session. utilized MHP during treatment to promote paraspinal relief. post session reported soress but did demonstrates improve in standing posture.    PT Treatment/Interventions ADLs/Self Care Home Management;Electrical Stimulation;Moist Heat;Traction;Ultrasound;Therapeutic exercise;Therapeutic activities;Functional mobility training;Gait training;Patient/family education;Manual techniques;Taping;Dry needling   PT Next Visit Plan posterior rotated innominated, manual trigger point release on bil parapsonas R>L. hamstring stretching,  MET for R hip flexors, (try doing while on e-stim for added pain relief PRN)   PT Home Exercise Plan lower trunk rotation, hamstring stretching, SLR, supine marching,    Consulted and Agree with Plan of Care Patient      Patient will benefit from skilled therapeutic intervention in order to improve the following deficits and impairments:  Abnormal gait, Decreased activity tolerance, Decreased coordination, Decreased range of motion, Decreased mobility, Decreased strength, Difficulty walking, Impaired flexibility, Increased fascial  restricitons, Hypomobility, Improper body mechanics, Postural dysfunction, Pain  Visit Diagnosis: Chronic right-sided low back pain with right-sided sciatica  Muscle weakness (generalized)  Abnormal posture  Cervicalgia     Problem List Patient Active Problem List   Diagnosis Date Noted  .  Chronic bilateral low back pain with right-sided sciatica 09/12/2016  . History of major depression 09/12/2016  . Inguinal hernia of right side without obstruction or gangrene 09/12/2016   Starr Lake PT, DPT, LAT, ATC  10/08/16  5:53 PM      [facility] [address] [place], [place], [address] Phone: [phone]   Fax:  [phone]  Name: Joshua [name] MRN: [id number] Date of Birth: Nov 19, 1970

## 2016-10-09 ENCOUNTER — Encounter: Payer: Self-pay | Admitting: Physical Therapy

## 2016-10-09 ENCOUNTER — Ambulatory Visit: Payer: PPO | Admitting: Physical Therapy

## 2016-10-09 DIAGNOSIS — M5441 Lumbago with sciatica, right side: Principal | ICD-10-CM

## 2016-10-09 DIAGNOSIS — M542 Cervicalgia: Secondary | ICD-10-CM

## 2016-10-09 DIAGNOSIS — R293 Abnormal posture: Secondary | ICD-10-CM

## 2016-10-09 DIAGNOSIS — M6281 Muscle weakness (generalized): Secondary | ICD-10-CM

## 2016-10-09 DIAGNOSIS — G8929 Other chronic pain: Secondary | ICD-10-CM

## 2016-10-09 NOTE — Therapy (Signed)
[place], [place], [address] Phone: [phone]   Fax:  [phone]  Physical Therapy Treatment  Patient Details  Name: Joshua [name] MRN: [id number] Date of Birth: 1970-12-20 Referring Provider: Fredia Beets  Encounter Date: 10/09/2016      PT End of Session - 10/09/16 1735    Visit Number 6   Number of Visits 24   Date for PT Re-Evaluation 11/17/16   PT Start Time 1633   PT Stop Time 1717   PT Time Calculation (min) 44 min   Activity Tolerance Patient tolerated treatment well;Patient limited by pain   Behavior During Therapy [facility] for tasks assessed/performed      History reviewed. No pertinent past medical history.  History reviewed. No pertinent surgical history.  There were no vitals filed for this visit.      Subjective Assessment - 10/09/16 1641    Subjective I feel the pain  but I havre more movement.    Patient is accompained by: Interpreter   Currently in Pain? Yes   Pain Score 5                          [facility] Adult PT Treatment/Exercise - 10/09/16 0001      Lumbar Exercises: Stretches   Passive Hamstring Stretch 2 reps   Lower Trunk Rotation Limitations cues to stay within pain free range     Lumbar Exercises: Sidelying   Other Sidelying Lumbar Exercises AA Rotations, small motions  PNF     Moist Heat Therapy   Number Minutes Moist Heat 15 Minutes   Moist Heat Location Lumbar Spine  and groin both     Electrical Stimulation   Electrical Stimulation Location left low back   Electrical Stimulation Action Hi Volt   Electrical Stimulation Parameters 25   Electrical Stimulation Goals Pain     Manual Therapy   Soft tissue mobilization IASTM right hamstring,  Followed by moist heat then stretching.  Patient continues to be sensitive to the smallest motions.  strumming light, to both illiopsoas hips/ knees 90/90,    Muscle Energy Technique 2 reps 10  seconds Contract relax right hasmstrings post manual  and heat  increased tingling in leg.  Tingling improved with soft tiss                PT Education - 10/08/16 1740    Education provided Yes   Education Details educated about SIJ and effects of muscles on the SIJ and effect on paraspinal muscles. updated HEP  extra time needed for interpretation   Person(s) Educated Patient   Methods Explanation;Handout;Verbal cues  used spine model   Comprehension Verbalized understanding;Verbal cues required          PT Short Term Goals - 10/06/16 1721      PT SHORT TERM GOAL #1   Title Pt will be I with initial HEP to assist with pain management   Baseline not issued   Time 4   Period Weeks   Status On-going     PT SHORT TERM GOAL #2   Title Pt will demo improved lumbar flexion to 50% present to assist with reaching toward floor   Time 4   Period Weeks   Status Unable to assess     PT SHORT TERM GOAL #3   Title Pt will demo improved lumbar ext to 50% present to assist with iADLs   Baseline no changes yet  Time 4   Period Weeks   Status On-going     PT SHORT TERM GOAL #4   Title Pt will report improved R LE radicular sxs x 25% to assist with more steadiness with ambulation   Baseline no leg pain today,  Had cramps in foot last night   Time 4   Period Weeks   Status On-going           PT Long Term Goals - 09/22/16 1635      PT LONG TERM GOAL #1   Title Pt will be able to sit > 30 min to eat dinner with decreased pain   Baseline 5 min   Time 8   Period Weeks   Status New     PT LONG TERM GOAL #2   Title Pt will be able to stand x 15 min to assist with household activities/shopping   Baseline 4-6 min   Time 8   Period Weeks   Status New     PT LONG TERM GOAL #3   Title Pt will report improved LBP to </= 4/10 at all times with functional mobility   Baseline up to 8/10   Time 8   Period Weeks   Status New     PT LONG TERM GOAL #4   Title Pt will  demo improved supine to sit transfers with </= 2/10 pain for getting out of bed   Baseline 6/10   Time 8   Period Weeks   Status New     PT LONG TERM GOAL #5   Title Pt will be able to hold small daughter x 5 min with </= 4/10 LBP/R sciatica   Baseline unable   Time 8   Period Weeks   Status New               Plan - 10/09/16 1736    Clinical Impression Statement Continue to work with patient to decrease posterior rotation, with modalities, stretching and manual.  Pain Present with all manual and exercise.  Hi Volt tolerated better than IFC.  Standing posture improved over last 2 sessions.    PT Treatment/Interventions ADLs/Self Care Home Management;Electrical Stimulation;Moist Heat;Traction;Ultrasound;Therapeutic exercise;Therapeutic activities;Functional mobility training;Gait training;Patient/family education;Manual techniques;Taping;Dry needling   PT Next Visit Plan posterior rotated innominated, manual trigger point release on bil parapsonas R>L. hamstring stretching,  MET for R hip flexors, (try doing while on e-stim for added pain relief PRN)   PT Home Exercise Plan lower trunk rotation, hamstring stretching, SLR, supine marching,    Consulted and Agree with Plan of Care Patient      Patient will benefit from skilled therapeutic intervention in order to improve the following deficits and impairments:  Abnormal gait, Decreased activity tolerance, Decreased coordination, Decreased range of motion, Decreased mobility, Decreased strength, Difficulty walking, Impaired flexibility, Increased fascial restricitons, Hypomobility, Improper body mechanics, Postural dysfunction, Pain  Visit Diagnosis: Chronic right-sided low back pain with right-sided sciatica  Muscle weakness (generalized)  Abnormal posture  Cervicalgia     Problem List Patient Active Problem List   Diagnosis Date Noted  . Chronic bilateral low back pain with right-sided sciatica 09/12/2016  . History of  major depression 09/12/2016  . Inguinal hernia of right side without obstruction or gangrene 09/12/2016    Darnetta Kesselman PTA 10/09/2016, 5:42 PM  [facility] [address] [place], [place], [address] Phone: [phone]   Fax:  [phone]  Name: Joshua [name] MRN: [id number] Date of Birth: [birth year]

## 2016-10-09 NOTE — Patient Instructions (Signed)
Continue with the stretches.  May want to take a warm shower prior to stretching.

## 2016-10-13 ENCOUNTER — Encounter: Payer: Self-pay | Admitting: Physical Therapy

## 2016-10-13 ENCOUNTER — Ambulatory Visit: Payer: PPO | Attending: Family Medicine | Admitting: Physical Therapy

## 2016-10-13 DIAGNOSIS — M6281 Muscle weakness (generalized): Secondary | ICD-10-CM | POA: Diagnosis not present

## 2016-10-13 DIAGNOSIS — R293 Abnormal posture: Secondary | ICD-10-CM

## 2016-10-13 DIAGNOSIS — M542 Cervicalgia: Secondary | ICD-10-CM | POA: Diagnosis not present

## 2016-10-13 DIAGNOSIS — M5441 Lumbago with sciatica, right side: Secondary | ICD-10-CM | POA: Diagnosis not present

## 2016-10-13 DIAGNOSIS — G8929 Other chronic pain: Secondary | ICD-10-CM | POA: Diagnosis not present

## 2016-10-13 NOTE — Therapy (Signed)
[place], [place], [address] Phone: [phone]   Fax:  [phone]  Physical Therapy Treatment  Patient Details  Name: Joshua [name] MRN: [id number] Date of Birth: 11-25-1970 Referring Provider: Fredia Beets  Encounter Date: 10/13/2016      PT End of Session - 10/13/16 1747    Visit Number 7   Number of Visits 24   Date for PT Re-Evaluation 11/17/16   PT Start Time 2671  charge will not equal time slot due to patient was late   PT Stop Time 1640   PT Time Calculation (min) 45 min   Activity Tolerance Patient limited by fatigue;Patient limited by pain   Behavior During Therapy [facility] for tasks assessed/performed      History reviewed. No pertinent past medical history.  History reviewed. No pertinent surgical history.  There were no vitals filed for this visit.      Subjective Assessment - 10/13/16 1600    Subjective Sore after the last visit. It started Friday night.  He had increased neck pain  I almost went to the hospital 9-10/10 pain. Back pain is 3-4/10   Patient is accompained by: Interpreter   Pertinent History Pt received therapy in [place] only 1 week and was for worker's comp in 2008. pt received 3 injections in lumbar and 2 cervical in [place]. 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 Pt has only received meds since 03/2016.    Currently in Pain? Yes   Pain Score 4   4-5/10   Pain Location Back   Pain Orientation Right   Pain Descriptors / Indicators Sore;Burning;Cramping   Pain Type Chronic pain   Pain Frequency Constant   Aggravating Factors  moving, walking   Pain Relieving Factors meds, massage   Effect of Pain on Daily Activities Limits ADL   Pain Location Neck   Pain Orientation Posterior   Pain Descriptors / Indicators --  unbearable   Pain Type Chronic pain   Pain Onset --  chronic   Aggravating Factors  not sure,    Pain Relieving  Factors curling up in a ball                         [facility] Adult PT Treatment/Exercise - 10/13/16 0001      Moist Heat Therapy   Moist Heat Location Cervical  groin, hamstrings     Ultrasound   Ultrasound Location right hamstrings   Ultrasound Parameters 1.5 watts/cm2, 8 minutes 100%   Ultrasound Goals Pain;Other (Comment)  flexibility     Manual Therapy   Manual therapy comments instrument intermittantly   Soft tissue mobilization right hamstrings,  long axix distraction with small movements into flexion.  Gentle small ROM right hil, hamstring stretch                   PT Short Term Goals - 10/13/16 1750      PT SHORT TERM GOAL #1   Title Pt will be I with initial HEP to assist with pain management   Baseline doing some   Time 4   Period Weeks   Status On-going     PT SHORT TERM GOAL #2   Title Pt will demo improved lumbar flexion to 50% present to assist with reaching toward floor   Time 4   Period Weeks   Status Unable to assess     PT SHORT TERM  GOAL #3   Title Pt will demo improved lumbar ext to 50% present to assist with iADLs   Time 4   Period Weeks   Status Unable to assess     PT SHORT TERM GOAL #4   Time 4   Period Weeks   Status Unable to assess           PT Long Term Goals - 09/22/16 1635      PT LONG TERM GOAL #1   Title Pt will be able to sit > 30 min to eat dinner with decreased pain   Baseline 5 min   Time 8   Period Weeks   Status New     PT LONG TERM GOAL #2   Title Pt will be able to stand x 15 min to assist with household activities/shopping   Baseline 4-6 min   Time 8   Period Weeks   Status New     PT LONG TERM GOAL #3   Title Pt will report improved LBP to </= 4/10 at all times with functional mobility   Baseline up to 8/10   Time 8   Period Weeks   Status New     PT LONG TERM GOAL #4   Title Pt will demo improved supine to sit transfers with </= 2/10 pain for getting out of bed   Baseline  6/10   Time 8   Period Weeks   Status New     PT LONG TERM GOAL #5   Title Pt will be able to hold small daughter x 5 min with </= 4/10 LBP/R sciatica   Baseline unable   Time 8   Period Weeks   Status New               Plan - 10/13/16 1748    Clinical Impression Statement Hamstring flexibility and ROM focus today.  less pain noted by patient.  Hamstring length 47 degrees.  His kids help him at home with massage.   PT Treatment/Interventions ADLs/Self Care Home Management;Electrical Stimulation;Moist Heat;Traction;Ultrasound;Therapeutic exercise;Therapeutic activities;Functional mobility training;Gait training;Patient/family education;Manual techniques;Taping;Dry needling   PT Next Visit Plan  FOTO?? assess treatment. ([place] manual) posterior rotated innominated, manual trigger point release on bil parapsonas R>L. hamstring stretching,  MET for R hip flexors, (try doing while on e-stim for added pain relief PRN)   PT Home Exercise Plan lower trunk rotation, hamstring stretching, SLR, supine marching,    Consulted and Agree with Plan of Care Patient      Patient will benefit from skilled therapeutic intervention in order to improve the following deficits and impairments:  Abnormal gait, Decreased activity tolerance, Decreased coordination, Decreased range of motion, Decreased mobility, Decreased strength, Difficulty walking, Impaired flexibility, Increased fascial restricitons, Hypomobility, Improper body mechanics, Postural dysfunction, Pain  Visit Diagnosis: Chronic right-sided low back pain with right-sided sciatica  Muscle weakness (generalized)  Abnormal posture  Cervicalgia     Problem List Patient Active Problem List   Diagnosis Date Noted  . Chronic bilateral low back pain with right-sided sciatica 09/12/2016  . History of major depression 09/12/2016  . Inguinal hernia of right side without obstruction or gangrene 09/12/2016    HARRIS,KAREN PTA 10/13/2016, 5:52  PM  [facility] [address] [place], [place], [address] Phone: [phone]   Fax:  [phone]  Name: Joshua [name] MRN: [id number] Date of Birth: [birth year]

## 2016-10-14 DIAGNOSIS — J0141 Acute recurrent pansinusitis: Secondary | ICD-10-CM | POA: Diagnosis not present

## 2016-10-14 DIAGNOSIS — J343 Hypertrophy of nasal turbinates: Secondary | ICD-10-CM | POA: Diagnosis not present

## 2016-10-14 DIAGNOSIS — J342 Deviated nasal septum: Secondary | ICD-10-CM | POA: Diagnosis not present

## 2016-10-16 ENCOUNTER — Encounter: Payer: PPO | Admitting: Physical Therapy

## 2016-10-20 ENCOUNTER — Encounter: Payer: PPO | Admitting: Physical Therapy

## 2016-10-22 ENCOUNTER — Encounter: Payer: PPO | Admitting: Physical Therapy

## 2016-10-23 ENCOUNTER — Encounter: Payer: PPO | Admitting: Physical Therapy

## 2016-10-27 ENCOUNTER — Encounter: Payer: Self-pay | Admitting: Physical Therapy

## 2016-10-27 ENCOUNTER — Ambulatory Visit: Payer: PPO | Admitting: Physical Therapy

## 2016-10-27 DIAGNOSIS — M5441 Lumbago with sciatica, right side: Secondary | ICD-10-CM | POA: Diagnosis not present

## 2016-10-27 DIAGNOSIS — M542 Cervicalgia: Secondary | ICD-10-CM

## 2016-10-27 DIAGNOSIS — R293 Abnormal posture: Secondary | ICD-10-CM

## 2016-10-27 DIAGNOSIS — G8929 Other chronic pain: Secondary | ICD-10-CM

## 2016-10-27 DIAGNOSIS — M6281 Muscle weakness (generalized): Secondary | ICD-10-CM

## 2016-10-27 NOTE — Therapy (Signed)
[place], [place], [address] Phone: [phone]   Fax:  [phone]  Physical Therapy Treatment  Patient Details  Name: Joshua [name] MRN: [id number] Date of Birth: Apr 16, 1970 Referring Provider: Fredia Beets  Encounter Date: 10/27/2016      PT End of Session - 10/27/16 1556    Visit Number 8   Number of Visits 24   Date for PT Re-Evaluation 11/17/16   PT Start Time 1503   PT Stop Time 1548   PT Time Calculation (min) 45 min   Activity Tolerance Patient tolerated treatment well;Patient limited by pain   Behavior During Therapy [facility] for tasks assessed/performed      History reviewed. No pertinent past medical history.  History reviewed. No pertinent surgical history.  There were no vitals filed for this visit.      Subjective Assessment - 10/27/16 1506    Subjective Pain is 8/10.  I got hurt on Friday.  i did not fall.  My back fell to me.  I loose my balance every time I get up.  May be due to low blood sugar level.  He did not check it.                           [facility] Adult PT Treatment/Exercise - 10/27/16 0001      Self-Care   Self-Care --  activity advice.  More exercise vs ADL around house   Other Self-Care Comments  discussed how to get up off coiuch.  cane strap around wrist so hands can be used to push up with.       Lumbar Exercises: Stretches   Passive Hamstring Stretch 2 reps;10 seconds   Passive Hamstring Stretch Limitations stopped due to note of foot going numb   Lower Trunk Rotation Limitations 3 X each,  small movements only,  cued to do only if pain free.    Pelvic Tilt 3 reps   Pelvic Tilt Limitations pain and no real movement noted     Lumbar Exercises: Supine   Bent Knee Raise 10 reps  right only,     Straight Leg Raise 10 reps  2 sets,  AA some    Other Supine Lumbar Exercises Isometric hip extension left hip 10 X.     Moist Heat Therapy    Number Minutes Moist Heat 8 Minutes   Moist Heat Location Lumbar Spine  concurrent with [place] on hamstrings     Ultrasound   Ultrasound Location right hamstrings  on side   Ultrasound Parameters 1.5 watts/cm2,  8 minutes   Ultrasound Goals Pain  and flexibility,  on side                  PT Short Term Goals - 10/27/16 1600      PT SHORT TERM GOAL #1   Title Pt will be I with initial HEP to assist with pain management   Baseline independent so far   Time 4   Period Weeks   Status On-going     PT SHORT TERM GOAL #2   Title Pt will demo improved lumbar flexion to 50% present to assist with reaching toward floor   Time 4   Period Weeks   Status Unable to assess     PT SHORT TERM GOAL #3   Title Pt will demo improved lumbar ext to 50% present to assist with iADLs   Baseline walking more  upright   Time 4   Period Weeks   Status Unable to assess     PT SHORT TERM GOAL #4   Title Pt will report improved R LE radicular sxs x 25% to assist with more steadiness with ambulation   Time 4   Period Weeks   Status Unable to assess           PT Long Term Goals - 09/22/16 1635      PT LONG TERM GOAL #1   Title Pt will be able to sit > 30 min to eat dinner with decreased pain   Baseline 5 min   Time 8   Period Weeks   Status New     PT LONG TERM GOAL #2   Title Pt will be able to stand x 15 min to assist with household activities/shopping   Baseline 4-6 min   Time 8   Period Weeks   Status New     PT LONG TERM GOAL #3   Title Pt will report improved LBP to </= 4/10 at all times with functional mobility   Baseline up to 8/10   Time 8   Period Weeks   Status New     PT LONG TERM GOAL #4   Title Pt will demo improved supine to sit transfers with </= 2/10 pain for getting out of bed   Baseline 6/10   Time 8   Period Weeks   Status New     PT LONG TERM GOAL #5   Title Pt will be able to hold small daughter x 5 min with </= 4/10 LBP/R sciatica   Baseline  unable   Time 8   Period Weeks   Status New               Plan - 10/27/16 1557    Clinical Impression Statement Patient had been making progress until Friday when he was rising off couch and ended up sitting in a chair. (How it happened is a mystery) Pain has been increased since then.  He had been happy since his last visit.  he is less tight.  pain is a little worse.  Posture walking is now more upright.  Hips were a little more level at the endof session.  pain not improved.  he declines the need to get a walker.     PT Treatment/Interventions ADLs/Self Care Home Management;Electrical Stimulation;Moist Heat;Traction;Ultrasound;Therapeutic exercise;Therapeutic activities;Functional mobility training;Gait training;Patient/family education;Manual techniques;Taping;Dry needling   PT Next Visit Plan Continue UC and manual stretching as tolerated,  Gentle isometrics . met for hip flex if time?   PT Home Exercise Plan lower trunk rotation, hamstring stretching, SLR, supine marching,    Consulted and Agree with Plan of Care Patient      Patient will benefit from skilled therapeutic intervention in order to improve the following deficits and impairments:  Abnormal gait, Decreased activity tolerance, Decreased coordination, Decreased range of motion, Decreased mobility, Decreased strength, Difficulty walking, Impaired flexibility, Increased fascial restricitons, Hypomobility, Improper body mechanics, Postural dysfunction, Pain  Visit Diagnosis: Chronic right-sided low back pain with right-sided sciatica  Muscle weakness (generalized)  Abnormal posture  Cervicalgia     Problem List Patient Active Problem List   Diagnosis Date Noted  . Chronic bilateral low back pain with right-sided sciatica 09/12/2016  . History of major depression 09/12/2016  . Inguinal hernia of right side without obstruction or gangrene 09/12/2016    HARRIS,KAREN PTA 10/27/2016, 4:06 PM  [facility] [address] [place], [place], [address] Phone: [phone]   Fax:  [phone]  Name: Joshua [name] MRN: [id number] Date of Birth: [birth year]

## 2016-10-27 NOTE — Therapy (Signed)
[place], [place], [address] Phone: [phone]   Fax:  [phone]  Physical Therapy Treatment  Patient Details  Name: Joshua [name] MRN: [id number] Date of Birth: 18-Nov-1970 Referring Provider: Fredia Beets  Encounter Date: 10/27/2016      PT End of Session - 10/27/16 1556    Visit Number 8   Number of Visits 24   Date for PT Re-Evaluation 11/17/16   PT Start Time 1503   PT Stop Time 1548   PT Time Calculation (min) 45 min   Activity Tolerance Patient tolerated treatment well;Patient limited by pain   Behavior During Therapy [facility] for tasks assessed/performed      History reviewed. No pertinent past medical history.  History reviewed. No pertinent surgical history.  There were no vitals filed for this visit.      Subjective Assessment - 10/27/16 1506    Subjective Pain is 8/10.  I got hurt on Friday.  i did not fall.  My back fell to me.  I loose my balance every time I get up.  May be due to low blood sugar level.  He did not check it.                           [facility] Adult PT Treatment/Exercise - 10/27/16 0001      Self-Care   Self-Care --  activity advice.  More exercise vs ADL around house   Other Self-Care Comments  discussed how to get up off coiuch.  cane strap around wrist so hands can be used to push up with.       Lumbar Exercises: Stretches   Passive Hamstring Stretch 2 reps;10 seconds   Passive Hamstring Stretch Limitations stopped due to note of foot going numb   Lower Trunk Rotation Limitations 3 X each,  small movements only,  cued to do only if pain free.    Pelvic Tilt 3 reps   Pelvic Tilt Limitations pain and no real movement noted     Lumbar Exercises: Supine   Bent Knee Raise 10 reps  right only,     Straight Leg Raise 10 reps  2 sets,  AA some    Other Supine Lumbar Exercises Isometric hip extension left hip 10 X.     Moist Heat Therapy    Number Minutes Moist Heat 8 Minutes   Moist Heat Location Lumbar Spine  concurrent with [place] on hamstrings     Ultrasound   Ultrasound Location right hamstrings  on side   Ultrasound Parameters 1.5 watts/cm2,  8 minutes   Ultrasound Goals Pain  and flexibility,  on side                  PT Short Term Goals - 10/27/16 1600      PT SHORT TERM GOAL #1   Title Pt will be I with initial HEP to assist with pain management   Baseline independent so far   Time 4   Period Weeks   Status On-going     PT SHORT TERM GOAL #2   Title Pt will demo improved lumbar flexion to 50% present to assist with reaching toward floor   Time 4   Period Weeks   Status Unable to assess     PT SHORT TERM GOAL #3   Title Pt will demo improved lumbar ext to 50% present to assist with iADLs   Baseline walking more  upright   Time 4   Period Weeks   Status Unable to assess     PT SHORT TERM GOAL #4   Title Pt will report improved R LE radicular sxs x 25% to assist with more steadiness with ambulation   Time 4   Period Weeks   Status Unable to assess           PT Long Term Goals - 09/22/16 1635      PT LONG TERM GOAL #1   Title Pt will be able to sit > 30 min to eat dinner with decreased pain   Baseline 5 min   Time 8   Period Weeks   Status New     PT LONG TERM GOAL #2   Title Pt will be able to stand x 15 min to assist with household activities/shopping   Baseline 4-6 min   Time 8   Period Weeks   Status New     PT LONG TERM GOAL #3   Title Pt will report improved LBP to </= 4/10 at all times with functional mobility   Baseline up to 8/10   Time 8   Period Weeks   Status New     PT LONG TERM GOAL #4   Title Pt will demo improved supine to sit transfers with </= 2/10 pain for getting out of bed   Baseline 6/10   Time 8   Period Weeks   Status New     PT LONG TERM GOAL #5   Title Pt will be able to hold small daughter x 5 min with </= 4/10 LBP/R sciatica   Baseline  unable   Time 8   Period Weeks   Status New               Plan - 10/27/16 1557    Clinical Impression Statement Patient had been making progress until Friday when he was rising off couch and ended up sitting in a chair. (How it happened is a mystery) Pain has been increased since then.  He had been happy since his last visit.  he is less tight.  pain is a little worse.  Posture walking is now more upright.  Hips were a little more level at the endof session.  pain not improved.  he declines the need to get a walker.     PT Treatment/Interventions ADLs/Self Care Home Management;Electrical Stimulation;Moist Heat;Traction;Ultrasound;Therapeutic exercise;Therapeutic activities;Functional mobility training;Gait training;Patient/family education;Manual techniques;Taping;Dry needling   PT Next Visit Plan Continue UC and manual stretching as tolerated,  Gentle isometrics . met for hip flex if time?  Measure trunk flexion,  Check goals.    PT Home Exercise Plan lower trunk rotation, hamstring stretching, SLR, supine marching,    Consulted and Agree with Plan of Care Patient      Patient will benefit from skilled therapeutic intervention in order to improve the following deficits and impairments:  Abnormal gait, Decreased activity tolerance, Decreased coordination, Decreased range of motion, Decreased mobility, Decreased strength, Difficulty walking, Impaired flexibility, Increased fascial restricitons, Hypomobility, Improper body mechanics, Postural dysfunction, Pain  Visit Diagnosis: Chronic right-sided low back pain with right-sided sciatica  Muscle weakness (generalized)  Abnormal posture  Cervicalgia     Problem List Patient Active Problem List   Diagnosis Date Noted  . Chronic bilateral low back pain with right-sided sciatica 09/12/2016  . History of major depression 09/12/2016  . Inguinal hernia of right side without obstruction or gangrene 09/12/2016  [facility] 10/27/2016, 4:05 PM  [facility] [address] [place], [place], [address] Phone: [phone]   Fax:  [phone]  Name: Joshua [name] MRN: [id number] Date of Birth: 1971/01/22

## 2016-10-28 ENCOUNTER — Ambulatory Visit: Payer: PPO | Admitting: Physical Therapy

## 2016-10-28 DIAGNOSIS — M6281 Muscle weakness (generalized): Secondary | ICD-10-CM

## 2016-10-28 DIAGNOSIS — R293 Abnormal posture: Secondary | ICD-10-CM

## 2016-10-28 DIAGNOSIS — M542 Cervicalgia: Secondary | ICD-10-CM

## 2016-10-28 DIAGNOSIS — G8929 Other chronic pain: Secondary | ICD-10-CM

## 2016-10-28 DIAGNOSIS — M5441 Lumbago with sciatica, right side: Secondary | ICD-10-CM | POA: Diagnosis not present

## 2016-10-28 NOTE — Therapy (Signed)
[place], [place], [address] Phone: [phone]   Fax:  [phone]  Physical Therapy Treatment  Patient Details  Name: Joshua [name] MRN: [id number] Date of Birth: 1970-06-02 Referring Provider: Fredia Beets  Encounter Date: 10/28/2016      PT End of Session - 10/28/16 1553    Visit Number 9   Number of Visits 24   Date for PT Re-Evaluation 11/17/16   PT Start Time 3546  pt arrived 9 minutes late today   PT Stop Time 1643   PT Time Calculation (min) 49 min   Activity Tolerance Patient tolerated treatment well   Behavior During Therapy [facility] for tasks assessed/performed      No past medical history on file.  No past surgical history on file.  There were no vitals filed for this visit.      Subjective Assessment - 10/28/16 1553    Subjective "My pain is a 7/10 today in the back, the therapy is going pretty good, still pain but more movement.    Currently in Pain? Yes   Pain Score 7    Pain Location Back   Pain Type Chronic pain   Pain Onset More than a month ago   Pain Frequency Constant   Aggravating Factors  moving, walking around, standing   Pain Relieving Factors meds, massage                         [facility] Adult PT Treatment/Exercise - 10/28/16 1611      Moist Heat Therapy   Number Minutes Moist Heat 10 Minutes   Moist Heat Location Lumbar Spine     Manual Therapy   Manual Therapy Myofascial release   Joint Mobilization grade 2-3 PA L1-L5 mobs   Soft tissue mobilization IATSM over R piriformis, and bil lumbar paraspinals   Myofascial Release rolling and fascial stretching over bil lumbar paraspinals          Trigger Point Dry Needling - 10/28/16 1609    Consent Given? Yes   Education Handout Provided Yes   Muscles Treated Upper Body Longissimus   Muscles Treated Lower Body Piriformis   Longissimus Response Twitch response elicited;Palpable increased  muscle length  bil L5-L3    Piriformis Response Twitch response elicited;Palpable increased muscle length              PT Education - 10/28/16 1601    Education provided Yes   Education Details muscle anatomy, and referral patterns. what TPDN, benefits/ what to expect and after care.   extra time needed for interpretation   Person(s) Educated Patient   Methods Explanation;Verbal cues;Handout   Comprehension Verbalized understanding;Verbal cues required          PT Short Term Goals - 10/27/16 1600      PT SHORT TERM GOAL #1   Title Pt will be I with initial HEP to assist with pain management   Baseline independent so far   Time 4   Period Weeks   Status On-going     PT SHORT TERM GOAL #2   Title Pt will demo improved lumbar flexion to 50% present to assist with reaching toward floor   Time 4   Period Weeks   Status Unable to assess     PT SHORT TERM GOAL #3   Title Pt will demo improved lumbar ext to 50% present to assist with iADLs   Baseline walking more upright  Time 4   Period Weeks   Status Unable to assess     PT SHORT TERM GOAL #4   Title Pt will report improved R LE radicular sxs x 25% to assist with more steadiness with ambulation   Time 4   Period Weeks   Status Unable to assess           PT Long Term Goals - 09/22/16 1635      PT LONG TERM GOAL #1   Title Pt will be able to sit > 30 min to eat dinner with decreased pain   Baseline 5 min   Time 8   Period Weeks   Status New     PT LONG TERM GOAL #2   Title Pt will be able to stand x 15 min to assist with household activities/shopping   Baseline 4-6 min   Time 8   Period Weeks   Status New     PT LONG TERM GOAL #3   Title Pt will report improved LBP to </= 4/10 at all times with functional mobility   Baseline up to 8/10   Time 8   Period Weeks   Status New     PT LONG TERM GOAL #4   Title Pt will demo improved supine to sit transfers with </= 2/10 pain for getting out of bed    Baseline 6/10   Time 8   Period Weeks   Status New     PT LONG TERM GOAL #5   Title Pt will be able to hold small daughter x 5 min with </= 4/10 LBP/R sciatica   Baseline unable   Time 8   Period Weeks   Status New               Plan - 10/28/16 1621    Clinical Impression Statement pt reports 7/10 pain today. educated and performed TPDN over the piriformis and bil lumbar paraspinals. utilized manual techniques to decrease muscle tightness and improve mobility. Post session pt reported imrpovement in mobility but stated he felt sore from the DN. utilized MHP over low back and piriformis which he reported reduce pain afterward.    PT Next Visit Plan pt response to DN, Continue UC and manual stretching as tolerated,  Gentle isometrics . met for hip flex if time?   PT Home Exercise Plan lower trunk rotation, hamstring stretching, SLR, supine marching,    Consulted and Agree with Plan of Care Patient      Patient will benefit from skilled therapeutic intervention in order to improve the following deficits and impairments:  Abnormal gait, Decreased activity tolerance, Decreased coordination, Decreased range of motion, Decreased mobility, Decreased strength, Difficulty walking, Impaired flexibility, Increased fascial restricitons, Hypomobility, Improper body mechanics, Postural dysfunction, Pain  Visit Diagnosis: Chronic right-sided low back pain with right-sided sciatica  Muscle weakness (generalized)  Abnormal posture  Cervicalgia     Problem List Patient Active Problem List   Diagnosis Date Noted  . Chronic bilateral low back pain with right-sided sciatica 09/12/2016  . History of major depression 09/12/2016  . Inguinal hernia of right side without obstruction or gangrene 09/12/2016   Starr [place] PT, DPT, LAT, ATC  10/28/16  4:32 PM      [facility] [address] [place], [place], [address] Phone:  [phone]   Fax:  [phone]  Name: Amel Gianino MRN: [id number] Date of Birth: [birth year]

## 2016-11-04 ENCOUNTER — Encounter (INDEPENDENT_AMBULATORY_CARE_PROVIDER_SITE_OTHER): Payer: Self-pay | Admitting: Orthopaedic Surgery

## 2016-11-04 ENCOUNTER — Ambulatory Visit (INDEPENDENT_AMBULATORY_CARE_PROVIDER_SITE_OTHER): Payer: PPO | Admitting: Orthopaedic Surgery

## 2016-11-04 ENCOUNTER — Encounter: Payer: PPO | Admitting: Physical Therapy

## 2016-11-04 VITALS — BP 143/102 | HR 76 | Ht 68.0 in | Wt 178.0 lb

## 2016-11-04 DIAGNOSIS — G8929 Other chronic pain: Secondary | ICD-10-CM | POA: Diagnosis not present

## 2016-11-04 DIAGNOSIS — Z8659 Personal history of other mental and behavioral disorders: Secondary | ICD-10-CM

## 2016-11-04 DIAGNOSIS — M5441 Lumbago with sciatica, right side: Secondary | ICD-10-CM | POA: Diagnosis not present

## 2016-11-04 NOTE — Progress Notes (Signed)
Office Visit Note   Patient: Joshua [name]           Date of Birth: 1971-02-08           MRN: [id number] Visit Date: 11/04/2016              Requested by: Lizbeth BarkHairston, Mandesia R, FNP [address] [place], [place] [address] PCP: Lizbeth BarkHairston, Mandesia R, FNP   Assessment & Plan: Visit Diagnoses:  1. Chronic bilateral low back pain with right-sided sciatica   2. History of major depression   3.     Neck pain with normal cervical MRI  Plan: I discussed the patient needs to gradually increase his activity level get back to working out and resuming work activities. He states he had [place] in [place] they told him he had compression in his neck. His MRI scan is reviewed with him today which shows no compression. Has couple more physical therapy visits they can incorporate some neck exercises and he will gradually resume more vigorous activities to get himself back into shape. Office follow-up when necessary.  Follow-Up Instructions: Return if symptoms worsen or fail to improve.   Orders:  No orders of the defined types were placed in this encounter.  No orders of the defined types were placed in this encounter.     Procedures: No procedures performed   Clinical Data: No additional findings.   Subjective: Chief Complaint  Patient presents with  . Neck - Pain, Follow-up  . Lower Back - Pain, Follow-up    HPI patient returns with ongoing low back pain and chronic neck pain. He had an MRI in [place] that showed significant cervical compression he relates by history and states there were 2 areas that showed narrowing. He has had a new MRI scan which is available for review today.  Review of Systems 14 point review of systems updated is unchanged from 09/09/2016 office visit. Of note is significant history of depression. He is in physical therapy for low back pain.   Objective: Vital Signs: BP (!) 143/102   Pulse 76   Ht 5\' 8"  (1.727 m)   Wt 178 lb (80.7 kg)   BMI  27.06 kg/m   Physical Exam  Constitutional: He is oriented to person, place, and time. He appears well-developed and well-nourished.  HENT:  Head: Normocephalic and atraumatic.  Eyes: Pupils are equal, round, and reactive to light. EOM are normal.  Neck: No tracheal deviation present. No thyromegaly present.  Cardiovascular: Normal rate.   Pulmonary/Chest: Effort normal. He has no wheezes.  Abdominal: Soft. Bowel sounds are normal.  Musculoskeletal:  Patient has good cervical range of motion. Same returned with a cane. He is here [facility] the [facility]. No rash over spurs skin good range of motion of the elbows shoulders. Grip strength.  Neurological: He is alert and oriented to person, place, and time.  Skin: Skin is warm and dry. Capillary refill takes less than 2 seconds.  Psychiatric: He has a normal mood and affect. His behavior is normal. Judgment and thought content normal.    Ortho Exam  Specialty Comments:  No specialty comments available.  Imaging: Study Result   CLINICAL DATA:  Chronic neck pain  EXAM: MRI CERVICAL SPINE WITHOUT CONTRAST  TECHNIQUE: Multiplanar, multisequence MR imaging of the cervical spine was performed. No intravenous contrast was administered.  COMPARISON:  Cervical spine radiograph 07/21/2016  FINDINGS: Alignment: Normal  Vertebrae: Normal marrow signal throughout.  Cord: Normal signal and caliber  Posterior Fossa, vertebral arteries, paraspinal tissues: Visualized posterior fossa is normal. Vertebral artery flow voids are preserved. Normal visualized paraspinal soft tissues.  Disc levels:  C1-C2: Normal.  C2-C3: Normal disc space and facets. No spinal canal or neuroforaminal stenosis.  C3-C4: Minimal right-sided uncovertebral spurring without associated stenosis.  C4-C5: Normal disc space and facets. No spinal canal or neuroforaminal stenosis.  C5-C6: Normal disc space and  facets. No spinal canal or neuroforaminal stenosis.  C6-C7: Normal disc space and facets. No spinal canal or neuroforaminal stenosis.  C7-T1: Normal disc space and facets. No spinal canal or neuroforaminal stenosis.  IMPRESSION: Normal cervical spine for age. No spinal canal or neural foraminal stenosis. The discs are normal. There is minimal uncovertebral degenerative change, greatest at C3-C4.   Electronically Signed   By: Deatra Robinson M.D.   On: 09/22/2016 23:00       PMFS History: Patient Active Problem List   Diagnosis Date Noted  . Chronic bilateral low back pain with right-sided sciatica 09/12/2016  . History of major depression 09/12/2016  . Inguinal hernia of right side without obstruction or gangrene 09/12/2016   No past medical history on file.  No family history on file.  No past surgical history on file. Social History   Occupational History  . Not on file.   Social History Main Topics  . Smoking status: Never Smoker  . Smokeless tobacco: Never Used  . Alcohol use No  . Drug use: No  . Sexual activity: Not on file

## 2016-11-06 ENCOUNTER — Ambulatory Visit: Payer: PPO | Admitting: Physical Therapy

## 2016-11-06 ENCOUNTER — Encounter: Payer: Self-pay | Admitting: Physical Therapy

## 2016-11-06 DIAGNOSIS — M6281 Muscle weakness (generalized): Secondary | ICD-10-CM

## 2016-11-06 DIAGNOSIS — R293 Abnormal posture: Secondary | ICD-10-CM

## 2016-11-06 DIAGNOSIS — G8929 Other chronic pain: Secondary | ICD-10-CM

## 2016-11-06 DIAGNOSIS — M542 Cervicalgia: Secondary | ICD-10-CM

## 2016-11-06 DIAGNOSIS — M5441 Lumbago with sciatica, right side: Principal | ICD-10-CM

## 2016-11-06 NOTE — Therapy (Signed)
[place], [place], [address] Phone: [phone]   Fax:  [phone]  Physical Therapy Treatment  Patient Details  Name: Joshua [name] MRN: [id number] Date of Birth: 14-Jun-1970 Referring Provider: Fredia Beets  Encounter Date: 11/06/2016      PT End of Session - 11/06/16 1402    Visit Number 10   Number of Visits 24   Date for PT Re-Evaluation 11/17/16   PT Start Time 1401   PT Stop Time 1455   PT Time Calculation (min) 54 min   Activity Tolerance Patient tolerated treatment well   Behavior During Therapy [facility] for tasks assessed/performed      History reviewed. No pertinent past medical history.  History reviewed. No pertinent surgical history.  There were no vitals filed for this visit.      Subjective Assessment - 11/06/16 1409    Subjective " I was very sore after the last session and the pain was up and the pain in my back and leg is 7/10" pt reports almost going to the ED.   Currently in Pain? Yes   Pain Score 7    Pain Location Back   Pain Orientation Right   Pain Type Chronic pain   Pain Onset More than a month ago   Pain Frequency Constant   Aggravating Factors  walking/ standing,    Pain Relieving Factors meds, massage,                          [facility] Adult PT Treatment/Exercise - 11/06/16 1430      Lumbar Exercises: Stretches   Active Hamstring Stretch 3 reps;30 seconds  contract/ relax with 10 sec hold     Lumbar Exercises: Supine   Bent Knee Raise 10 reps  with sustained posterior pelvic tilt   Straight Leg Raise 10 reps  x 2 sets     Moist Heat Therapy   Number Minutes Moist Heat 10 Minutes   Moist Heat Location Lumbar Spine;Knee  in prone     Manual Therapy   Joint Mobilization R innominate anterior mobs grade 3    Soft tissue mobilization IATSM over R hamstrings   Myofascial Release rolling and fascial stretching over bil lumbar  paraspinals   Muscle Energy Technique prone resisted R hip flexion 10 x 10 sec hold, combined with R hip innominate anterior Grade 3 mobs          Trigger Point Dry Needling - 11/06/16 1415    Consent Given? Yes   Education Handout Provided No  given previously   Muscles Treated Lower Body Hamstring   Hamstring Response Twitch response elicited;Palpable increased muscle length                PT Short Term Goals - 10/27/16 1600      PT SHORT TERM GOAL #1   Title Pt will be I with initial HEP to assist with pain management   Baseline independent so far   Time 4   Period Weeks   Status On-going     PT SHORT TERM GOAL #2   Title Pt will demo improved lumbar flexion to 50% present to assist with reaching toward floor   Time 4   Period Weeks   Status Unable to assess     PT SHORT TERM GOAL #3   Title Pt will demo improved lumbar ext to 50% present to assist with iADLs   Baseline walking  more upright   Time 4   Period Weeks   Status Unable to assess     PT SHORT TERM GOAL #4   Title Pt will report improved R LE radicular sxs x 25% to assist with more steadiness with ambulation   Time 4   Period Weeks   Status Unable to assess           PT Long Term Goals - 09/22/16 1635      PT LONG TERM GOAL #1   Title Pt will be able to sit > 30 min to eat dinner with decreased pain   Baseline 5 min   Time 8   Period Weeks   Status New     PT LONG TERM GOAL #2   Title Pt will be able to stand x 15 min to assist with household activities/shopping   Baseline 4-6 min   Time 8   Period Weeks   Status New     PT LONG TERM GOAL #3   Title Pt will report improved LBP to </= 4/10 at all times with functional mobility   Baseline up to 8/10   Time 8   Period Weeks   Status New     PT LONG TERM GOAL #4   Title Pt will demo improved supine to sit transfers with </= 2/10 pain for getting out of bed   Baseline 6/10   Time 8   Period Weeks   Status New     PT LONG  TERM GOAL #5   Title Pt will be able to hold small daughter x 5 min with </= 4/10 LBP/R sciatica   Baseline unable   Time 8   Period Weeks   Status New               Plan - 11/06/16 1451    Clinical Impression Statement pt continues to report 7/10 pain today which he reported DN made him feel sore after last session. Continued DN over the R hamstrings which reproduced his low back pain. continued MET technique with resisted R hip flexion and innominate mobs. continued hip flexor activation and strengthenging which he perofrmed well but reported pain in the back/ hip. continued MHP post session.    PT Next Visit Plan pt response to DN, Continue UC and manual stretching as tolerated,  Gentle isometrics . met for hip flex if time?   PT Home Exercise Plan lower trunk rotation, hamstring stretching, SLR, supine marching,    Consulted and Agree with Plan of Care Patient      Patient will benefit from skilled therapeutic intervention in order to improve the following deficits and impairments:  Abnormal gait, Decreased activity tolerance, Decreased coordination, Decreased range of motion, Decreased mobility, Decreased strength, Difficulty walking, Impaired flexibility, Increased fascial restricitons, Hypomobility, Improper body mechanics, Postural dysfunction, Pain  Visit Diagnosis: Chronic right-sided low back pain with right-sided sciatica  Muscle weakness (generalized)  Abnormal posture  Cervicalgia     Problem List Patient Active Problem List   Diagnosis Date Noted  . Chronic bilateral low back pain with right-sided sciatica 09/12/2016  . History of major depression 09/12/2016  . Inguinal hernia of right side without obstruction or gangrene 09/12/2016   Starr Lake PT, DPT, LAT, ATC  11/06/16  2:56 PM      [place] [facility] [address] [place], [place], [address] Phone: [phone]   Fax:  [phone]  Name:  Joshua [name] MRN: [id number] Date of  Birth: 1970/10/01

## 2016-11-11 ENCOUNTER — Encounter: Payer: Self-pay | Admitting: Physical Therapy

## 2016-11-11 ENCOUNTER — Ambulatory Visit: Payer: PPO | Admitting: Physical Therapy

## 2016-11-11 DIAGNOSIS — R293 Abnormal posture: Secondary | ICD-10-CM

## 2016-11-11 DIAGNOSIS — M6281 Muscle weakness (generalized): Secondary | ICD-10-CM

## 2016-11-11 DIAGNOSIS — G8929 Other chronic pain: Secondary | ICD-10-CM

## 2016-11-11 DIAGNOSIS — M5441 Lumbago with sciatica, right side: Secondary | ICD-10-CM | POA: Diagnosis not present

## 2016-11-11 DIAGNOSIS — M542 Cervicalgia: Secondary | ICD-10-CM

## 2016-11-11 NOTE — Therapy (Signed)
[place], [place], [address] Phone: [phone]   Fax:  [phone]  Physical Therapy Treatment  Patient Details  Name: Joshua [name] MRN: [id number] Date of Birth: [birth year] Referring Provider: Fredia Beets  Encounter Date: 11/11/2016      PT End of Session - 11/11/16 1550    Visit Number 11   Number of Visits 24   Date for PT Re-Evaluation 11/17/16   PT Start Time 5400   PT Stop Time 1639   PT Time Calculation (min) 49 min   Activity Tolerance Patient tolerated treatment well   Behavior During Therapy [facility] for tasks assessed/performed      History reviewed. No pertinent past medical history.  History reviewed. No pertinent surgical history.  There were no vitals filed for this visit.      Subjective Assessment - 11/11/16 1553    Subjective "I am feeling sore today into my leg, the Dn did help last time"   Currently in Pain? Yes   Pain Score 6    Pain Location Back   Pain Orientation Right                         [facility] Adult PT Treatment/Exercise - 11/11/16 0001      Knee/Hip Exercises: Stretches   Active Hamstring Stretch 3 reps;30 seconds  contract / relax      Moist Heat Therapy   Number Minutes Moist Heat 10 Minutes   Moist Heat Location Knee  hamstring in supine     Manual Therapy   Soft tissue mobilization IATSM over L hamstrings   Myofascial Release rolling and fascial stretching over bil lumbar paraspinals   Muscle Energy Technique prone resisted R hip flexion 10 x 10 sec hold, combined with R hip innominate anterior Grade 3 mobs          Trigger Point Dry Needling - 11/11/16 1608    Consent Given? Yes   Education Handout Provided No  given    Hamstring Response Twitch response elicited;Palpable increased muscle length  L              PT Education - 11/11/16 1728    Education provided Yes   Education Details to walk and normally  and equally as possible decreasing limping to prevent compensation.    Person(s) Educated Patient   Methods Explanation;Verbal cues;Handout   Comprehension Verbalized understanding;Verbal cues required          PT Short Term Goals - 10/27/16 1600      PT SHORT TERM GOAL #1   Title Pt will be I with initial HEP to assist with pain management   Baseline independent so far   Time 4   Period Weeks   Status On-going     PT SHORT TERM GOAL #2   Title Pt will demo improved lumbar flexion to 50% present to assist with reaching toward floor   Time 4   Period Weeks   Status Unable to assess     PT SHORT TERM GOAL #3   Title Pt will demo improved lumbar ext to 50% present to assist with iADLs   Baseline walking more upright   Time 4   Period Weeks   Status Unable to assess     PT SHORT TERM GOAL #4   Title Pt will report improved R LE radicular sxs x 25% to assist with more steadiness with ambulation   Time 4  Period Weeks   Status Unable to assess           PT Long Term Goals - 09/22/16 1635      PT LONG TERM GOAL #1   Title Pt will be able to sit > 30 min to eat dinner with decreased pain   Baseline 5 min   Time 8   Period Weeks   Status New     PT LONG TERM GOAL #2   Title Pt will be able to stand x 15 min to assist with household activities/shopping   Baseline 4-6 min   Time 8   Period Weeks   Status New     PT LONG TERM GOAL #3   Title Pt will report improved LBP to </= 4/10 at all times with functional mobility   Baseline up to 8/10   Time 8   Period Weeks   Status New     PT LONG TERM GOAL #4   Title Pt will demo improved supine to sit transfers with </= 2/10 pain for getting out of bed   Baseline 6/10   Time 8   Period Weeks   Status New     PT LONG TERM GOAL #5   Title Pt will be able to hold small daughter x 5 min with </= 4/10 LBP/R sciatica   Baseline unable   Time 8   Period Weeks   Status New               Plan - 11/11/16  1732    Clinical Impression Statement pt reported decreased pain today at 6/10 in the low back reporting only 3/10 in the RLE and 6/10 LLE. continued DN focusing on the L hamstring. conintued IASTM techniques and stretching. Discussed with pt that if he isn't doing the exercises at home its not going to benefit or see improvement. continued MHP to calm down muscle soreness.    PT Next Visit Plan pt response to DN, Continue UC and manual stretching as tolerated,  Gentle isometrics . met for hip flex if time?  if He is not making any more progress then plan to talk about D/C   PT Home Exercise Plan lower trunk rotation, hamstring stretching, SLR, supine marching,    Consulted and Agree with Plan of Care Patient      Patient will benefit from skilled therapeutic intervention in order to improve the following deficits and impairments:     Visit Diagnosis: Chronic right-sided low back pain with right-sided sciatica  Muscle weakness (generalized)  Abnormal posture  Cervicalgia     Problem List Patient Active Problem List   Diagnosis Date Noted  . Chronic bilateral low back pain with right-sided sciatica 09/12/2016  . History of major depression 09/12/2016  . Inguinal hernia of right side without obstruction or gangrene 09/12/2016   Starr Lake PT, DPT, LAT, ATC  11/11/16  5:38 PM      [place] [facility] [address] [place], [place], [address] Phone: [phone]   Fax:  [phone]  Name: Joshua [name] MRN: [id number] Date of Birth: [birth year]

## 2016-11-13 ENCOUNTER — Ambulatory Visit: Payer: PPO | Attending: Family Medicine | Admitting: Physical Therapy

## 2016-11-13 DIAGNOSIS — M5441 Lumbago with sciatica, right side: Secondary | ICD-10-CM | POA: Insufficient documentation

## 2016-11-13 DIAGNOSIS — R293 Abnormal posture: Secondary | ICD-10-CM | POA: Insufficient documentation

## 2016-11-13 DIAGNOSIS — G8929 Other chronic pain: Secondary | ICD-10-CM | POA: Diagnosis not present

## 2016-11-13 DIAGNOSIS — M542 Cervicalgia: Secondary | ICD-10-CM | POA: Insufficient documentation

## 2016-11-13 DIAGNOSIS — M6281 Muscle weakness (generalized): Secondary | ICD-10-CM | POA: Diagnosis not present

## 2016-11-13 NOTE — Therapy (Signed)
[facility] [address] [place], [place], [address] Phone: [phone]   Fax:  [phone]  Physical Therapy Treatment  Patient Details  Name: Joshua [name] MRN: [id number] Date of Birth: Sep 24, 1970 Referring Provider: Arrie SenateHairston, Mandesia  Encounter Date: 11/13/2016      PT End of Session - 11/13/16 1719    Visit Number 12   Number of Visits 24   Date for PT Re-Evaluation 11/17/16   PT Start Time 1551   PT Stop Time 1649   PT Time Calculation (min) 58 min   Activity Tolerance Patient tolerated treatment well   Behavior During Therapy [facility] for tasks assessed/performed      No past medical history on file.  No past surgical history on file.  There were no vitals filed for this visit.                       [facility] Adult PT Treatment/Exercise - 11/13/16 0001      Self-Care   Other Self-Care Comments  answered questions about pain.       Lumbar Exercises: Stretches   Active Hamstring Stretch 3 reps;30 seconds   Active Hamstring Stretch Limitations Both today left was more painful     Lumbar Exercises: Supine   Bent Knee Raise 10 reps  with sustained posterior pelvic tilt   Bent Knee Raise Limitations spasm QL increased, LT   Straight Leg Raise 10 reps   Straight Leg Raises Limitations AA, stopped due to pain     Moist Heat Therapy   Number Minutes Moist Heat 15 Minutes   Moist Heat Location Knee  hamstrings     Ultrasound   Ultrasound Location right hamstrings   Ultrasound Parameters 1.5 watts/cm2 X 8 minutes   Ultrasound Goals Pain;Other (Comment)  pain  + to prepare tissues for stretching     Manual Therapy   Soft tissue mobilization low back spasm flared with supine exercises,  left QL ,  paraspinals and passive stretches lightly increased his pain. ,  tissue not softened   Muscle Energy Technique gentle PNF motions                  PT Short Term Goals - 11/13/16 1736      PT  SHORT TERM GOAL #1   Title Pt will be I with initial HEP to assist with pain management   Baseline independent so far   Time 4   Period Weeks   Status On-going     PT SHORT TERM GOAL #2   Title Pt will demo improved lumbar flexion to 50% present to assist with reaching toward floor   Time 4   Period Weeks   Status Unable to assess           PT Long Term Goals - 11/13/16 1736      PT LONG TERM GOAL #1   Title Pt will be able to sit > 30 min to eat dinner with decreased pain   Period Weeks   Status Unable to assess     PT LONG TERM GOAL #2   Title Pt will be able to stand x 15 min to assist with household activities/shopping   Time 8   Period Weeks   Status Unable to assess     PT LONG TERM GOAL #3   Title Pt will report improved LBP to </= 4/10 at all times with functional mobility   Baseline 7/10 today up  to 9/10   Time 8   Period Weeks   Status On-going     PT LONG TERM GOAL #4   Title Pt will demo improved supine to sit transfers with </= 2/10 pain for getting out of bed   Baseline physical display of pain = more than 2/10   Time 8   Period Weeks   Status On-going     PT LONG TERM GOAL #5   Title Pt will be able to hold small daughter x 5 min with </= 4/10 LBP/R sciatica   Time 8   Period Weeks   Status Unable to assess               Plan - 11/13/16 1720    Clinical Impression Statement Pain 6/10 at end of session.  Spasm increased with exercises. Pain dominance continues. DN in the past has been helpful with his posture.   He bears heavy weight on his cane vs just using it for balance per his report. he has declined the use of a walker. (Free).     Clinical Impairments Affecting Rehab Potential pain   PT Treatment/Interventions ADLs/Self Care Home Management;Electrical Stimulation;Moist Heat;Traction;Ultrasound;Therapeutic exercise;Therapeutic activities;Functional mobility training;Gait training;Patient/family education;Manual techniques;Taping;Dry  needling   PT Next Visit Plan 1 visit scheduled in 2 weeks due to busy schedule(Clinic)  POC8/09/2016.  Consider simulating traction.   PT Home Exercise Plan lower trunk rotation, hamstring stretching, SLR, supine marching,    Consulted and Agree with Plan of Care Patient      Patient will benefit from skilled therapeutic intervention in order to improve the following deficits and impairments:  Abnormal gait, Decreased activity tolerance, Decreased coordination, Decreased range of motion, Decreased mobility, Decreased strength, Difficulty walking, Impaired flexibility, Increased fascial restricitons, Hypomobility, Improper body mechanics, Postural dysfunction, Pain  Visit Diagnosis: Chronic right-sided low back pain with right-sided sciatica  Muscle weakness (generalized)  Abnormal posture  Cervicalgia     Problem List Patient Active Problem List   Diagnosis Date Noted  . Chronic bilateral low back pain with right-sided sciatica 09/12/2016  . History of major depression 09/12/2016  . Inguinal hernia of right side without obstruction or gangrene 09/12/2016    HARRIS,KAREN PTA 11/13/2016, 5:40 PM  [facility] [address] [place], [place], [address] Phone: [phone]   Fax:  [phone]  Name: Joshua [name] MRN: [id number] Date of Birth: 08-18-1970

## 2016-11-17 ENCOUNTER — Ambulatory Visit: Payer: PPO | Admitting: Family Medicine

## 2016-11-18 ENCOUNTER — Ambulatory Visit (HOSPITAL_COMMUNITY): Payer: PPO | Admitting: Psychiatry

## 2016-11-25 ENCOUNTER — Ambulatory Visit: Payer: PPO | Admitting: Physical Therapy

## 2016-12-01 ENCOUNTER — Ambulatory Visit: Payer: PPO | Admitting: Physical Therapy

## 2016-12-01 ENCOUNTER — Encounter: Payer: Self-pay | Admitting: Physical Therapy

## 2016-12-01 DIAGNOSIS — R293 Abnormal posture: Secondary | ICD-10-CM

## 2016-12-01 DIAGNOSIS — M542 Cervicalgia: Secondary | ICD-10-CM

## 2016-12-01 DIAGNOSIS — J321 Chronic frontal sinusitis: Secondary | ICD-10-CM | POA: Diagnosis not present

## 2016-12-01 DIAGNOSIS — J320 Chronic maxillary sinusitis: Secondary | ICD-10-CM | POA: Diagnosis not present

## 2016-12-01 DIAGNOSIS — J322 Chronic ethmoidal sinusitis: Secondary | ICD-10-CM | POA: Diagnosis not present

## 2016-12-01 DIAGNOSIS — J342 Deviated nasal septum: Secondary | ICD-10-CM | POA: Diagnosis not present

## 2016-12-01 DIAGNOSIS — J3489 Other specified disorders of nose and nasal sinuses: Secondary | ICD-10-CM | POA: Diagnosis not present

## 2016-12-01 DIAGNOSIS — G8929 Other chronic pain: Secondary | ICD-10-CM

## 2016-12-01 DIAGNOSIS — M6281 Muscle weakness (generalized): Secondary | ICD-10-CM

## 2016-12-01 DIAGNOSIS — J0141 Acute recurrent pansinusitis: Secondary | ICD-10-CM | POA: Diagnosis not present

## 2016-12-01 DIAGNOSIS — M5441 Lumbago with sciatica, right side: Secondary | ICD-10-CM | POA: Diagnosis not present

## 2016-12-01 DIAGNOSIS — J343 Hypertrophy of nasal turbinates: Secondary | ICD-10-CM | POA: Diagnosis not present

## 2016-12-02 DIAGNOSIS — M5441 Lumbago with sciatica, right side: Secondary | ICD-10-CM | POA: Diagnosis not present

## 2016-12-02 NOTE — Therapy (Signed)
[facility] [address] [place], [place], [address] Phone: [phone]   Fax:  [phone]  Physical Therapy Evaluation  Patient Details  Name: Dajaun Goldring MRN: [id number] Date of Birth: 1970/04/22 Referring Provider: Arrie Senate  Encounter Date: 12/01/2016      PT End of Session - 12/02/16 1417    Visit Number 13   Number of Visits 24   Date for PT Re-Evaluation 12/30/16   PT Start Time 1632   PT Stop Time 1715   PT Time Calculation (min) 43 min   Activity Tolerance Patient tolerated treatment well   Behavior During Therapy [facility] for tasks assessed/performed      History reviewed. No pertinent past medical history.  History reviewed. No pertinent surgical history.  There were no vitals filed for this visit.       Subjective Assessment - 12/01/16 1635    Subjective Patient feels like he is less stiff. He hfeels like his movement is better. He continues to have about 7/10 pain. He has been working on his stretches and exercise at home. H ehas also been walking at home    Pertinent History Pt received therapy in [place] only 1 week and was for worker's comp in 2008. pt received 3 injections in lumbar and 2 cervical in [place] 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 Pt has only received meds since 03/2016.    Limitations Sitting;Lifting;Standing;Walking;House hold activities   How long can you sit comfortably? 5 min   How long can you stand comfortably? 4-6 min   How long can you walk comfortably? 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    Patient Stated Goals to get better and be able to hold my baby   Currently in Pain? Yes   Pain Score 7    Pain Location Back   Pain Orientation Right   Pain Descriptors / Indicators Sore;Burning;Cramping   Pain Type Chronic pain   Pain  Onset More than a month ago            [facility] PT Assessment - 12/02/16 0001      AROM   Overall AROM  Deficits   Overall AROM Comments flexion limited 50% with pain; Right rotation 50% limited; Extension limited 50 %;      Strength   Overall Strength Deficits   Right Hip Flexion 3+/5   Right Hip ABduction 4/5   Left Hip Flexion 4+/5   Left Hip ABduction 4/5   Right Knee Flexion 4/5   Right Knee Extension 4/5   Left Knee Flexion 4/5   Left Knee Extension 4/5            Objective measurements completed on examination: See above findings.          [facility] Adult PT Treatment/Exercise - 12/02/16 0001      Lumbar Exercises: Stretches   Active Hamstring Stretch 3 reps;30 seconds   Active Hamstring Stretch Limitations Both today left was more painful   Single Knee to Chest Stretch Limitations 2x15 seconds with a towel    Lower Trunk Rotation Limitations 2x10      Lumbar Exercises: Supine   Bent Knee Raise 10 reps  with sustained posterior pelvic tilt   Other Supine Lumbar Exercises supine clam shell red 2x10      Manual Therapy   Soft tissue mobilization  IASTYM to lumbar paraspinals; attempoted LAD but had too much pain; attmepted light hip stretches but had too much pain with all stretches.                 PT Education - 12/02/16 1416    Education provided Yes   Education Details improtance of strengthening and stretching    Person(s) Educated Patient   Methods Explanation;Demonstration;Tactile cues;Verbal cues   Comprehension Returned demonstration;Verbalized understanding;Verbal cues required;Tactile cues required          PT Short Term Goals - 12/02/16 1425      PT SHORT TERM GOAL #1   Title Pt will be I with initial HEP to assist with pain management   Baseline independent so far   Time 4   Period Weeks   Status New     PT SHORT TERM GOAL #2   Title Pt will demo improved lumbar flexion to 50% present to assist with reaching toward floor    Baseline 75% limited   Time 4   Period Weeks   Status New     PT SHORT TERM GOAL #3   Title Pt will demo improved lumbar ext to 50% present to assist with iADLs   Baseline walking more upright   Time 4   Period Weeks   Status New     PT SHORT TERM GOAL #4   Title Pt will report improved R LE radicular sxs x 25% to assist with more steadiness with ambulation   Baseline no leg pain today,  Had cramps in foot last night   Time 4   Period Weeks   Status New           PT Long Term Goals - 11/13/16 1736      PT LONG TERM GOAL #1   Title Pt will be able to sit > 30 min to eat dinner with decreased pain   Period Weeks   Status Unable to assess     PT LONG TERM GOAL #2   Title Pt will be able to stand x 15 min to assist with household activities/shopping   Time 8   Period Weeks   Status Unable to assess     PT LONG TERM GOAL #3   Title Pt will report improved LBP to </= 4/10 at all times with functional mobility   Baseline 7/10 today up to 9/10   Time 8   Period Weeks   Status On-going     PT LONG TERM GOAL #4   Title Pt will demo improved supine to sit transfers with </= 2/10 pain for getting out of bed   Baseline physical display of pain = more than 2/10   Time 8   Period Weeks   Status On-going     PT LONG TERM GOAL #5   Title Pt will be able to hold small daughter x 5 min with </= 4/10 LBP/R sciatica   Time 8   Period Weeks   Status Unable to assess                Plan - 12/02/16 1417    Clinical Impression Statement Patient reports he is better but still has 7/10 pain and can not tolerate any exercises or even light stretching. The patient has not been seen for three weeks. He reports he has been doing his exercises but he continues to require moderate cuing and can not complete most fo them. He reported some benefit with dry  needling . He will be seen by PT for dry needling for 1 more visit. If he feels like he is making progress he may come for a  nother vist or 2 for needling. He was advised he may need to follow up with MD for further treatment options.     Clinical Presentation Unstable   Clinical Decision Making Moderate   Rehab Potential Good   Clinical Impairments Affecting Rehab Potential pain   PT Frequency 3x / week   PT Duration 8 weeks   PT Treatment/Interventions ADLs/Self Care Home Management;Electrical Stimulation;Moist Heat;Traction;Ultrasound;Therapeutic exercise;Therapeutic activities;Functional mobility training;Gait training;Patient/family education;Manual techniques;Taping;Dry needling   PT Next Visit Plan 1 visit scheduled in 2 weeks due to busy schedule(Clinic)  POC8/09/2016.  Consider simulating traction.   PT Home Exercise Plan lower trunk rotation, hamstring stretching, SLR, supine marching,    Consulted and Agree with Plan of Care Patient      Patient will benefit from skilled therapeutic intervention in order to improve the following deficits and impairments:  Abnormal gait, Decreased activity tolerance, Decreased coordination, Decreased range of motion, Decreased mobility, Decreased strength, Difficulty walking, Impaired flexibility, Increased fascial restricitons, Hypomobility, Improper body mechanics, Postural dysfunction, Pain  Visit Diagnosis: Chronic right-sided low back pain with right-sided sciatica - Plan: PT plan of care cert/re-cert  Muscle weakness (generalized) - Plan: PT plan of care cert/re-cert  Abnormal posture - Plan: PT plan of care cert/re-cert  Cervicalgia - Plan: PT plan of care cert/re-cert      G-Codes - 12/22/2016 1426    Functional Assessment Tool Used (Outpatient Only) clinical decision making    Functional Limitation Mobility: Walking and moving around   Mobility: Walking and Moving Around Current Status ([id number]) At least 80 percent but less than 100 percent impaired, limited or restricted   Mobility: Walking and Moving Around Goal Status [phone]) At least 60 percent but less  than 80 percent impaired, limited or restricted       Problem List Patient Active Problem List   Diagnosis Date Noted  . Chronic bilateral low back pain with right-sided sciatica 09/12/2016  . History of major depression 09/12/2016  . Inguinal hernia of right side without obstruction or gangrene 09/12/2016    Dessie Coma PT DPT  2016/12/22, 2:31 PM  [facility] [address] [place], [place], [address] Phone: [phone]   Fax:  [phone]  Name: Ike Maragh MRN: [id number] Date of Birth: [birth year]

## 2016-12-03 ENCOUNTER — Ambulatory Visit: Payer: PPO | Admitting: Physical Therapy

## 2016-12-09 ENCOUNTER — Encounter: Payer: Self-pay | Admitting: Physical Therapy

## 2016-12-09 ENCOUNTER — Ambulatory Visit: Payer: PPO | Admitting: Physical Therapy

## 2016-12-09 DIAGNOSIS — M6281 Muscle weakness (generalized): Secondary | ICD-10-CM

## 2016-12-09 DIAGNOSIS — R293 Abnormal posture: Secondary | ICD-10-CM

## 2016-12-09 DIAGNOSIS — M5441 Lumbago with sciatica, right side: Secondary | ICD-10-CM | POA: Diagnosis not present

## 2016-12-09 DIAGNOSIS — M542 Cervicalgia: Secondary | ICD-10-CM

## 2016-12-09 DIAGNOSIS — G8929 Other chronic pain: Secondary | ICD-10-CM

## 2016-12-09 NOTE — Patient Instructions (Signed)
Prone on elbows issued from exercise drawer.  Flat 3 minutes first then ease onto elbows.   1-2 x a day

## 2016-12-09 NOTE — Therapy (Addendum)
[name], [place], [address] Phone: [phone]   Fax:  [phone]  Physical Therapy Treatment / Discharge Summary  Patient Details  Name: Joshua [name] MRN: [id number] Date of Birth: Sep 24, 1970 Referring Provider: Fredia Beets  Encounter Date: 12/09/2016      [name] End of Session - 12/09/16 1733    Visit Number 14   Number of Visits 24   Date for [name] Re-Evaluation 12/30/16   [name] Start Time 5427   [name] Stop Time 1645   [name] Time Calculation (min) 57 min   Activity Tolerance Patient tolerated treatment well      History reviewed. No pertinent past medical history.  History reviewed. No pertinent surgical history.  There were no vitals filed for this visit.      Subjective Assessment - 12/09/16 1552    Subjective Less leg pain.  back pain is worse .    Patient is accompained by: Interpreter   Currently in Pain? Yes   Pain Score 6    Pain Location Back   Pain Orientation Right   Pain Descriptors / Indicators Sore;Burning;Cramping   Pain Type Chronic pain   Pain Frequency Intermittent   Aggravating Factors  some exercises,    Pain Relieving Factors meds massage   Multiple Pain Sites Yes   Pain Score 4  up to 9/10   Pain Location Neck   Pain Orientation Posterior   Pain Type Chronic pain   Pain Radiating Towards not any longer   Pain Frequency Constant   Aggravating Factors  worse in the morning   Pain Relieving Factors none given, sometimes wakes with   Effect of Pain on Daily Activities shore time holding  Baby.                         Joshua [name] Treatment/Exercise - 12/09/16 0001      Lumbar Exercises: Stretches   Active Hamstring Stretch 3 reps;30 seconds   Active Hamstring Stretch Limitations strap,  cues,  both   Lower Trunk Rotation 5 reps   Lower Trunk Rotation Limitations small motions   Prone on Elbows Stretch 30 seconds   Prone on Elbows Stretch  Limitations HEP.  Joshua [name] needed to ease onto right  so back was flat prior to pronew on elbows.  Pain noted to be so so.   Quad Stretch --  hamstring curl for slight stretch quads 10 x     Lumbar Exercises: Supine   Bridge 10 reps     Knee/Hip Exercises: Stretches   Passive Hamstring Stretch 3 reps;30 seconds   Passive Hamstring Stretch Limitations heavy cues   Hip Flexor Stretch 5 reps  prone   Hip Flexor Stretch Limitations gently      Moist Heat Therapy   Number Minutes Moist Heat 15 Minutes   Moist Heat Location Lumbar Spine     Manual Therapy   Soft tissue mobilization right hamstrings,  instrument assist                [name] Education - 12/09/16 1732    Education Details HEP   Person(s) Educated Patient   Methods Explanation;Verbal cues;Handout   Comprehension Verbalized understanding;Returned demonstration          [name] Short Term Goals - 12/09/16 1737      [name] SHORT TERM GOAL #1   Title [name] will be I with initial HEP to assist with pain management  Baseline independent  12/09/2016   Time 4   Period Weeks   Status Achieved     [name] SHORT TERM GOAL #2   Title [name] will demo improved lumbar flexion to 50% present to assist with reaching toward floor   Time 4   Status Unable to assess     [name] SHORT TERM GOAL #3   Title [name] will demo improved lumbar ext to 50% present to assist with iADLs   Baseline able to get prone on elbows today   Time 4   Period Weeks     [name] SHORT TERM GOAL #4   Title [name] will report improved R LE radicular sxs x 25% to assist with more steadiness with ambulation   Baseline less leg pain, Joshua [name] was not specific   Time 4   Period Weeks   Status On-going           [name] Long Term Goals - 11/13/16 1736      [name] LONG TERM GOAL #1   Title [name] will be able to sit > 30 min to eat dinner with decreased pain   Period Weeks   Status Unable to assess     [name] LONG TERM GOAL #2   Title [name] will be able to stand x 15 min to assist with household  activities/shopping   Time 8   Period Weeks   Status Unable to assess     [name] LONG TERM GOAL #3   Title [name] will report improved LBP to </= 4/10 at all times with functional mobility   Baseline 7/10 today up to 9/10   Time 8   Period Weeks   Status On-going     [name] LONG TERM GOAL #4   Title [name] will demo improved supine to sit transfers with </= 2/10 pain for getting out of bed   Baseline physical display of pain = more than 2/10   Time 8   Period Weeks   Status On-going     [name] LONG TERM GOAL #5   Title [name] will be able to hold small daughter x 5 min with </= 4/10 LBP/R sciatica   Time 8   Period Weeks   Status Unable to assess               Plan - 12/09/16 1733    Clinical Impression Statement Able to progress his HEP.  STG#1 met. Back pain is now intermittant.   Pain with exercise 7/10.  less pain with moist heat.  Joshua [name] is able to hold his baby 4 minutes if Joshua [name] leans agaist the wall and keeps her lower in his arms.    [name] Home Exercise Plan lower trunk rotation, hamstring stretching, SLR, supine marching,    Consulted and Agree with Plan of Care Patient      Patient will benefit from skilled therapeutic intervention in order to improve the following deficits and impairments:  Abnormal gait, Decreased activity tolerance, Decreased coordination, Decreased range of motion, Decreased mobility, Decreased strength, Difficulty walking, Impaired flexibility, Increased fascial restricitons, Hypomobility, Improper body mechanics, Postural dysfunction, Pain  Visit Diagnosis: Chronic right-sided low back pain with right-sided sciatica  Muscle weakness (generalized)  Abnormal posture  Cervicalgia     Problem List Patient Active Problem List   Diagnosis Date Noted  . Chronic bilateral low back pain with right-sided sciatica 09/12/2016  . History of major depression 09/12/2016  . Inguinal hernia of right side without obstruction or gangrene 09/12/2016      Merrill Villarruel   PTA 12/09/2016, 5:39 PM  [facility] [address] [place], [place], [address] Phone: [phone]   Fax:  [phone]  Name: Joshua [name] MRN: [id number] Date of Birth: 07/05/1970   PHYSICAL THERAPY DISCHARGE SUMMARY  Visits from Start of Care: 14  Current functional level related to goals / functional outcomes: See goals   Remaining deficits: unknown   Education / Equipment: HEP, posture, theraband  Plan: Patient agrees to discharge.  Patient goals were partially met. Patient is being discharged due to not returning since the last visit.  ?????      Kristoffer Leamon [name], DPT, LAT, ATC  02/23/17  8:34 AM

## 2017-01-14 ENCOUNTER — Encounter (HOSPITAL_COMMUNITY): Payer: Self-pay | Admitting: Psychiatry

## 2017-01-14 ENCOUNTER — Ambulatory Visit (INDEPENDENT_AMBULATORY_CARE_PROVIDER_SITE_OTHER): Payer: PPO | Admitting: Psychiatry

## 2017-01-14 VITALS — BP 120/78 | HR 78 | Ht 68.0 in | Wt 173.0 lb

## 2017-01-14 DIAGNOSIS — G8929 Other chronic pain: Secondary | ICD-10-CM

## 2017-01-14 DIAGNOSIS — G4730 Sleep apnea, unspecified: Secondary | ICD-10-CM

## 2017-01-14 DIAGNOSIS — F333 Major depressive disorder, recurrent, severe with psychotic symptoms: Secondary | ICD-10-CM

## 2017-01-14 DIAGNOSIS — R45851 Suicidal ideations: Secondary | ICD-10-CM

## 2017-01-14 DIAGNOSIS — Z915 Personal history of self-harm: Secondary | ICD-10-CM

## 2017-01-14 DIAGNOSIS — Z79899 Other long term (current) drug therapy: Secondary | ICD-10-CM | POA: Diagnosis not present

## 2017-01-14 DIAGNOSIS — Z8659 Personal history of other mental and behavioral disorders: Secondary | ICD-10-CM | POA: Diagnosis not present

## 2017-01-14 MED ORDER — PAROXETINE HCL 20 MG PO TABS: 20.0000 mg | ORAL_TABLET | Freq: Every day | ORAL | 1 refills | Status: DC

## 2017-01-14 MED ORDER — ARIPIPRAZOLE 2 MG PO TABS: 2.0000 mg | ORAL_TABLET | Freq: Every day | ORAL | 1 refills | Status: DC

## 2017-01-14 MED ORDER — NORTRIPTYLINE HCL 25 MG PO CAPS: 25.0000 mg | ORAL_CAPSULE | Freq: Every day | ORAL | 1 refills | Status: DC
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
## 2017-01-14 NOTE — Patient Instructions (Signed)
Deje Cymbalta  Comiense Nortriptyline por la noche al [employer]  Continue tomando Paxil por la Man[place] con alimento  Comiense Abilify por la manana con Paxil  Porfavor llame para darle cita de la [employer]   Y El estudio del [place].

## 2017-03-10 ENCOUNTER — Telehealth: Payer: Self-pay | Admitting: Family Medicine

## 2017-03-10 ENCOUNTER — Encounter (HOSPITAL_COMMUNITY): Payer: Self-pay | Admitting: Psychiatry

## 2017-03-10 NOTE — Telephone Encounter (Signed)
Hey there, can you help Joshua [name] get rescheduled, im booking pretty far out so if he wants to be on a cancellation list that is okay too

## 2017-03-10 NOTE — Telephone Encounter (Signed)
Pt called to request a refill for  -Acetaminophen-Codeine (TYLENOL/CODEINE #3) 300-30 MG tablet  -ibuprofen (ADVIL,MOTRIN) 800 MG tablet -methocarbamol (ROBAXIN) 500 MG tablet  Please follow up She requested if the refills can be made from tomorrow to dec 9 And if approved be sent to [facility]([address], [place], [place] [id number])

## 2017-03-10 NOTE — Telephone Encounter (Signed)
Will defer to PCP

## 2017-03-11 ENCOUNTER — Ambulatory Visit (HOSPITAL_COMMUNITY): Payer: Self-pay | Admitting: Psychiatry

## 2017-03-12 ENCOUNTER — Other Ambulatory Visit: Payer: Self-pay | Admitting: Family Medicine

## 2017-03-12 DIAGNOSIS — M5441 Lumbago with sciatica, right side: Principal | ICD-10-CM

## 2017-03-12 DIAGNOSIS — G8929 Other chronic pain: Secondary | ICD-10-CM

## 2017-03-12 MED ORDER — METHOCARBAMOL 500 MG PO TABS: 500.0000 mg | ORAL_TABLET | Freq: Three times a day (TID) | ORAL | 0 refills | Status: DC | PRN

## 2017-03-12 MED ORDER — IBUPROFEN 800 MG PO TABS: 800.0000 mg | ORAL_TABLET | Freq: Three times a day (TID) | ORAL | 0 refills | Status: DC | PRN

## 2017-03-12 NOTE — Telephone Encounter (Signed)
Refill for ibuprofen and methocarbmol only. Patient last seen June 2018. Recommend he schedule an office visit.

## 2017-04-12 ENCOUNTER — Ambulatory Visit (HOSPITAL_BASED_OUTPATIENT_CLINIC_OR_DEPARTMENT_OTHER): Payer: PPO | Attending: Psychiatry | Admitting: Internal Medicine

## 2017-04-12 VITALS — Ht 68.0 in | Wt 180.0 lb

## 2017-04-12 DIAGNOSIS — Z79899 Other long term (current) drug therapy: Secondary | ICD-10-CM | POA: Insufficient documentation

## 2017-04-12 DIAGNOSIS — G4736 Sleep related hypoventilation in conditions classified elsewhere: Secondary | ICD-10-CM | POA: Diagnosis not present

## 2017-04-12 DIAGNOSIS — G4730 Sleep apnea, unspecified: Secondary | ICD-10-CM

## 2017-04-12 DIAGNOSIS — G4733 Obstructive sleep apnea (adult) (pediatric): Secondary | ICD-10-CM

## 2017-04-12 DIAGNOSIS — R0989 Other specified symptoms and signs involving the circulatory and respiratory systems: Secondary | ICD-10-CM | POA: Diagnosis present

## 2017-04-12 DIAGNOSIS — F329 Major depressive disorder, single episode, unspecified: Secondary | ICD-10-CM | POA: Insufficient documentation

## 2017-05-01 ENCOUNTER — Encounter: Payer: Self-pay | Admitting: Family Medicine

## 2017-05-01 ENCOUNTER — Ambulatory Visit: Payer: PPO | Attending: Family Medicine | Admitting: Family Medicine

## 2017-05-01 ENCOUNTER — Other Ambulatory Visit: Payer: Self-pay

## 2017-05-01 VITALS — BP 123/83 | HR 80 | Temp 97.8°F | Resp 16 | Wt 178.4 lb

## 2017-05-01 DIAGNOSIS — Z79899 Other long term (current) drug therapy: Secondary | ICD-10-CM | POA: Insufficient documentation

## 2017-05-01 DIAGNOSIS — K219 Gastro-esophageal reflux disease without esophagitis: Secondary | ICD-10-CM | POA: Insufficient documentation

## 2017-05-01 DIAGNOSIS — G8929 Other chronic pain: Secondary | ICD-10-CM | POA: Diagnosis not present

## 2017-05-01 DIAGNOSIS — R1011 Right upper quadrant pain: Secondary | ICD-10-CM | POA: Insufficient documentation

## 2017-05-01 DIAGNOSIS — M5441 Lumbago with sciatica, right side: Secondary | ICD-10-CM | POA: Insufficient documentation

## 2017-05-01 DIAGNOSIS — Z7982 Long term (current) use of aspirin: Secondary | ICD-10-CM | POA: Insufficient documentation

## 2017-05-01 MED ORDER — ACETAMINOPHEN-CODEINE 300-30 MG PO TABS: 1.0000 | ORAL_TABLET | Freq: Four times a day (QID) | ORAL | 1 refills | Status: DC | PRN

## 2017-05-01 MED ORDER — OMEPRAZOLE 20 MG PO CPDR: 20.0000 mg | DELAYED_RELEASE_CAPSULE | Freq: Every day | ORAL | 1 refills | Status: DC

## 2017-05-01 MED ORDER — METHOCARBAMOL 500 MG PO TABS: 500.0000 mg | ORAL_TABLET | Freq: Three times a day (TID) | ORAL | 0 refills | Status: DC | PRN

## 2017-05-01 NOTE — Patient Instructions (Signed)
Dolor abdominal en adultos  Abdominal Pain, Adult  El dolor abdominal puede tener muchas causas. A menudo, no es grave y mejora sin tratamiento o con tratamiento en la casa. Sin embargo, a veces el dolor abdominal es intenso. El médico revisará sus antecedentes médicos y le hará un examen físico para tratar de determinar la causa del dolor abdominal.  Siga estas instrucciones en su casa:  · Tome los medicamentos de venta libre y los recetados solamente como se lo haya indicado el médico. No tome un laxante a menos que se lo haya indicado el médico.  · Beba suficiente líquido para mantener la orina clara o de color amarillo pálido.  · Controle su afección para ver si hay cambios.  · Concurra a todas las visitas de control como se lo haya indicado el médico. Esto es importante.  Comuníquese con un médico si:  · El dolor abdominal cambia o empeora.  · No tiene apetito o baja de peso sin proponérselo.  · Está estreñido o tiene diarrea durante más de 2 o 3 días.  · Tiene dolor cuando orina o defeca.  · El dolor abdominal lo despierta de noche.  · El dolor empeora con las comidas, después de comer o con determinados alimentos.  · Tiene vómitos y no puede retener nada.  · Tiene fiebre.  Solicite ayuda de inmediato si:  · El dolor no desaparece tan pronto como el médico le dijo que era esperable.  · No puede detener los vómitos.  · El dolor se siente solo en zonas del abdomen, como el lado derecho o la parte inferior izquierda del abdomen.  · Las heces son sanguinolentas o de color negro, o de aspecto alquitranado.  · Tiene dolor intenso, cólicos, o meteorismo en el abdomen.  · Tiene signos de deshidratación, por ejemplo:  ? Orina oscura, muy escasa o falta de orina.  ? Labios agrietados.  ? Boca seca.  ? Ojos hundidos.  ? Somnolencia.  ? Debilidad.  Esta información no tiene como fin reemplazar el consejo del médico. Asegúrese de hacerle al médico cualquier pregunta que tenga.  Document Released: 03/31/2005 Document  Revised: 03/20/2016 Document Reviewed: 09/12/2015  Elsevier Interactive Patient Education © 2018 Elsevier Inc.

## 2017-05-01 NOTE — Progress Notes (Signed)
Subjective:  Patient ID: Joshua [name], male    DOB: 03/20/1971  Age: 47 y.o. MRN: [id number]  CC: Pain (abdomen, leg, back)   HPI Leverne Amrhein presents for medication refills and abdominal complaint. PMH significant for chronic back pain and major depression. Chronic back pain: History of back injuries in 2008 and 2011 while in [place] Reports right leg sciatica. Denies any bowel or bladder incontinence. History of spinal injections in the past. Reports ambulation with cane since 2012. Previous workup has included x-ray and MRI.  He is now being seen by an orthopedic specialist and has completed PT. Current medications include anti-inflammatories, muscle relaxants, and narcotic pain medication. History of depression: Diagnosed with depression in 2012 while in [place]  He is under the care of under a psychiatrist. Denies any SI/HI.  He reports taking Paxil for depression.  However he denies taking Abilify due to financial cost. Recommended he follow up with psychiatrist to see what other options may be available.  He reports upcoming appointment with psychiatrist in February.  Abdominal complaint:  The pain is described as aching. Pain is located in the RUQ without radiation. Onset was 2 weeks ago. Symptoms have been unchanged since. Aggravating factors: eating.  Alleviating factors: none. The patient denies constipation, diarrhea, hematochezia, melena, nausea and vomiting.  History of GERD he reports not taking anything for symptoms.   Outpatient Medications Prior to Visit  Medication Sig Dispense Refill  . aspirin 81 MG tablet Take 1 tablet (81 mg total) by mouth daily. 90 tablet 3  . fluticasone (FLONASE) 50 MCG/ACT nasal spray Place into the nose.    . nortriptyline (PAMELOR) 25 MG capsule Take 1 capsule (25 mg total) by mouth at bedtime. 90 capsule 1  . PARoxetine (PAXIL) 20 MG tablet Take 1 tablet (20 mg total) by mouth daily. 90 tablet 1  . pravastatin (PRAVACHOL) 20 MG  tablet Take 1 tablet (20 mg total) by mouth daily. 30 tablet 2  . Acetaminophen-Codeine (TYLENOL/CODEINE #3) 300-30 MG tablet Take 1-2 tablets by mouth every 6 (six) hours as needed for pain. 40 tablet 0  . ibuprofen (ADVIL,MOTRIN) 800 MG tablet Take 1 tablet (800 mg total) by mouth every 8 (eight) hours as needed for moderate pain or cramping (Take with food.). 30 tablet 0  . methocarbamol (ROBAXIN) 500 MG tablet Take 1 tablet (500 mg total) by mouth every 8 (eight) hours as needed for muscle spasms. 30 tablet 0  . omeprazole (PRILOSEC) 20 MG capsule Take 1 capsule (20 mg total) by mouth daily. 30 capsule 1  . ARIPiprazole (ABILIFY) 2 MG tablet Take 1 tablet (2 mg total) by mouth daily. (Patient not taking: Reported on 05/01/2017) 90 tablet 1   No facility-administered medications prior to visit.     Review of Systems  Constitutional: Negative.   Respiratory: Negative.   Cardiovascular: Negative.   Musculoskeletal: Positive for back pain (chronic).  Psychiatric/Behavioral: Depression: history of MDD.   Objective:  BP 123/83 (BP Location: Left Arm, Patient Position: Sitting, Cuff Size: Normal)   Pulse 80   Temp 97.8 F (36.6 C) (Oral)   Resp 16   Wt 178 lb 6.4 oz (80.9 kg)   SpO2 94%   BMI 27.13 kg/m   BP/Weight 05/01/2017 04/12/2017 11/04/2016  Systolic BP 123 - 143  Diastolic BP 83 - 409  Wt. (Lbs) 178.4 180 178  BMI 27.13 27.37 27.06  Some encounter information is confidential and restricted. Go to Review Flowsheets activity to  see all data.   Physical Exam  Nursing note and vitals reviewed. Constitutional: He is oriented to person, place, and time. He appears well-developed and well-nourished.  HENT:  Head: Normocephalic and atraumatic.  Right Ear: External ear normal.  Left Ear: External ear normal.  Nose: Nose normal.  Mouth/Throat: Oropharynx is clear and moist.  Eyes: Conjunctivae are normal. Pupils are equal, round, and reactive to light.  Neck: Normal range of  motion. Neck supple.  Cardiovascular: Normal rate, regular rhythm, normal heart sounds and intact distal pulses.  Respiratory: Effort normal and breath sounds normal.  GI: Soft. Bowel sounds are normal. There is tenderness (RUQ).  Musculoskeletal:       Lumbar back: He exhibits pain.  Lymphadenopathy:    He has no cervical adenopathy.  Neurological: He is alert and oriented to person, place, and time.  Skin: Skin is warm and dry.  Psychiatric: He has a normal mood and affect.     Assessment & Plan:   1. Chronic bilateral low back pain with right-sided sciatica Medication refills. - Acetaminophen-Codeine (TYLENOL/CODEINE #3) 300-30 MG tablet; Take 1-2 tablets by mouth every 6 (six) hours as needed for pain.  Dispense: 40 tablet; Refill: 1 - methocarbamol (ROBAXIN) 500 MG tablet; Take 1 tablet (500 mg total) by mouth every 8 (eight) hours as needed for muscle spasms.  Dispense: 30 tablet; Refill: 0 - omeprazole (PRILOSEC) 20 MG capsule; Take 1 capsule (20 mg total) by mouth daily.  Dispense: 30 capsule; Refill: 1  2. RUQ pain   - CMP and Liver - Hepatitis C Antibody - Lipid Panel - CBC  3. Gastroesophageal reflux disease, esophagitis presence not specified  - omeprazole (PRILOSEC) 20 MG capsule; Take 1 capsule (20 mg total) by mouth daily.  Dispense: 30 capsule; Refill: 1    Follow-up: Return in about 3 months (around 07/30/2017), or if symptoms worsen or fail to improve, for GERD/ Back .   Lizbeth BarkMandesia R Jodean Valade FNP

## 2017-05-01 NOTE — Progress Notes (Signed)
Abdominal pain x 2 weeks Back and leg pain

## 2017-05-02 DIAGNOSIS — G4730 Sleep apnea, unspecified: Secondary | ICD-10-CM | POA: Diagnosis not present

## 2017-05-02 LAB — CMP AND LIVER
ALT: 89 IU/L — AB (ref 0–44)
AST: 39 IU/L (ref 0–40)
Albumin: 4.6 g/dL (ref 3.5–5.5)
Alkaline Phosphatase: 91 IU/L (ref 39–117)
BUN: 18 mg/dL (ref 6–24)
Bilirubin Total: 0.4 mg/dL (ref 0.0–1.2)
Bilirubin, Direct: 0.12 mg/dL (ref 0.00–0.40)
CALCIUM: 9.9 mg/dL (ref 8.7–10.2)
CHLORIDE: 101 mmol/L (ref 96–106)
CO2: 23 mmol/L (ref 20–29)
CREATININE: 1.05 mg/dL (ref 0.76–1.27)
GFR calc Af Amer: 99 mL/min/{1.73_m2} (ref >59)
GFR, EST NON AFRICAN AMERICAN: 85 mL/min/{1.73_m2} (ref >59)
GLUCOSE: 78 mg/dL (ref 65–99)
Potassium: 4.4 mmol/L (ref 3.5–5.2)
Sodium: 142 mmol/L (ref 134–144)
Total Protein: 7.1 g/dL (ref 6.0–8.5)

## 2017-05-02 LAB — LIPID PANEL
CHOL/HDL RATIO: 6 ratio — AB (ref 0.0–5.0)
CHOLESTEROL TOTAL: 205 mg/dL — AB (ref 100–199)
HDL: 34 mg/dL — AB (ref >39)
LDL CALC: 126 mg/dL — AB (ref 0–99)
TRIGLYCERIDES: 223 mg/dL — AB (ref 0–149)
VLDL CHOLESTEROL CAL: 45 mg/dL — AB (ref 5–40)

## 2017-05-02 LAB — CBC
HEMOGLOBIN: 14.9 g/dL (ref 13.0–17.7)
Hematocrit: 44.9 % (ref 37.5–51.0)
MCH: 29.2 pg (ref 26.6–33.0)
MCHC: 33.2 g/dL (ref 31.5–35.7)
MCV: 88 fL (ref 79–97)
Platelets: 283 10*3/uL (ref 150–379)
RBC: 5.1 x10E6/uL (ref 4.14–5.80)
RDW: 13.5 % (ref 12.3–15.4)
WBC: 10.3 10*3/uL (ref 3.4–10.8)

## 2017-05-02 LAB — HEPATITIS C ANTIBODY: Hep C Virus Ab: 0.2 s/co ratio (ref 0.0–0.9)

## 2017-05-02 NOTE — Procedures (Signed)
   Patient Name: Joshua [name], [name]: 04/12/2017 Gender: Male D.O.B: 1971-03-31 Age (years): 46 Referring Provider: Angus PalmsAlexander Eksir Height (inches): 68 Interpreting Physician: Jetty Duhamellinton Saxton Chain MD, ABSM Weight (lbs): 180 RPSGT: Wylie HailDavis, Rico BMI: 27 MRN: [id number] Neck Size: 17.25 <br> <br> CLINICAL INFORMATION Sleep Study Type: NPSG  Indication for sleep study: Daytime Fatigue, Depression, Snoring  Epworth Sleepiness Score: 18  SLEEP STUDY TECHNIQUE As per the AASM Manual for the Scoring of Sleep and Associated Events v2.3 (April 2016) with a hypopnea requiring 4% desaturations.  The channels recorded and monitored were frontal, central and occipital EEG, electrooculogram (EOG), submentalis EMG (chin), nasal and oral airflow, thoracic and abdominal wall motion, anterior tibialis EMG, snore microphone, electrocardiogram, and pulse oximetry.  MEDICATIONS Medications self-administered by patient taken the night of the study : ACETAMINOPHEN W/CODEINE, METHOCARBAMOL, NORTRIPTYLINE, IBUPROFEN  SLEEP ARCHITECTURE The study was initiated at 9:31:33 PM and ended at 4:08:01 AM.  Sleep onset time was 34.3 minutes and the sleep efficiency was 61.2%. The total sleep time was 242.7 minutes.  Stage REM latency was 234.5 minutes.  The patient spent 7.29% of the night in stage N1 sleep, 63.66% in stage N2 sleep, 0.00% in stage N3 and 29.05% in REM.  Alpha intrusion was absent.  Supine sleep was 52.48%.  RESPIRATORY PARAMETERS The overall apnea/hypopnea index (AHI) was 41.5 per hour. There were 9 total apneas, including 4 obstructive, 5 central and 0 mixed apneas. There were 159 hypopneas and 1 RERAs.  The AHI during Stage REM sleep was 33.2 per hour.  AHI while supine was 46.6 per hour.  The mean oxygen saturation was 91.33%. The minimum SpO2 during sleep was 77.00%.  moderate snoring was noted during this study.  CARDIAC DATA The 2 lead EKG demonstrated sinus rhythm. The  mean heart rate was 59.13 beats per minute. Other EKG findings include: None.  LEG MOVEMENT DATA The total PLMS were 17 with a resulting PLMS index of 4.20. Associated arousal with leg movement index was 0.5 .  IMPRESSIONS - Severe obstructive sleep apnea occurred during this study (AHI = 41.5/h). - No significant central sleep apnea occurred during this study (CAI = 1.2/h). - Moderate oxygen desaturation was noted during this study (Min O2 = 77.00%, Mean 91.3%). 19.4 minutess recorded with O2 saturation <= 88%. - The patient snored with moderate snoring volume. - No cardiac abnormalities were noted during this study. - Clinically significant periodic limb movements did not occur during sleep. No significant associated arousals. - Difficulty initiating sleep, with sustained sleep onset only after 12:211 0AM.  DIAGNOSIS - Obstructive Sleep Apnea (327.23 [G47.33 ICD-10]) - Nocturnal Hypoxemia (327.26 [G47.36 ICD-10])  RECOMMENDATIONS - Therapeutic CPAP titration to determine optimal pressure required to alleviate sleep disordered breathing. - Positional therapy avoiding supine position during sleep. - Be careful with alcohol, sedatives and other CNS depressants that may worsen sleep apnea and disrupt normal sleep architecture. - Sleep hygiene should be reviewed to assess factors that may improve sleep quality. - Weight management and regular exercise should be initiated or continued if appropriate.  [Electronically signed] 05/02/2017 10:47 AM  Jetty Duhamellinton Tanzania Basham MD, ABSM Diplomate, American Board of Sleep Medicine   NPI: [id number]  Jetty Duhamellinton Sarath Privott Diplomate, American Board of Sleep Medicine  ELECTRONICALLY SIGNED ON:  05/02/2017, 10:43 AM [facility] PH: [phone]   FX: [phone] ACCREDITED BY THE AMERICAN ACADEMY OF SLEEP MEDICINE

## 2017-05-04 ENCOUNTER — Other Ambulatory Visit (HOSPITAL_COMMUNITY): Payer: Self-pay | Admitting: Psychiatry

## 2017-05-04 DIAGNOSIS — G4733 Obstructive sleep apnea (adult) (pediatric): Secondary | ICD-10-CM

## 2017-05-04 NOTE — Progress Notes (Signed)
Oh nevermind, this was just the polysom results. I discussed them with him via interpreter and have ordered a CPAP titration.

## 2017-05-04 NOTE — Progress Notes (Signed)
Discussed Polysomnography results with patient via translator.  Explained to the pathophysiology of how sleep apnea affects energy, thinking, metabolism, lung health.  Expressed my recommendation that we proceed with CPAP titration in the sleep clinic.  Patient was appreciative and agrees to move forward with the recommended intervention.  Educated him on what this may involve in terms of having a mask on his face and air pressure to keep his airway open.  He was agreeable and appreciative for the call.  I have placed a referral for CPAP titration to be scheduled Via Spanish interpreter.

## 2017-05-04 NOTE — Progress Notes (Signed)
Hey lets keep an eye out for the CPAP machine orders for this.

## 2017-05-06 ENCOUNTER — Other Ambulatory Visit: Payer: Self-pay | Admitting: Family Medicine

## 2017-05-06 DIAGNOSIS — E782 Mixed hyperlipidemia: Secondary | ICD-10-CM | POA: Insufficient documentation

## 2017-05-06 MED ORDER — ATORVASTATIN CALCIUM 20 MG PO TABS: 20.0000 mg | ORAL_TABLET | Freq: Every day | ORAL | 2 refills | Status: DC

## 2017-05-11 ENCOUNTER — Telehealth (INDEPENDENT_AMBULATORY_CARE_PROVIDER_SITE_OTHER): Payer: Self-pay | Admitting: *Deleted

## 2017-05-11 NOTE — Telephone Encounter (Signed)
-----   Message from Lizbeth BarkMandesia R Hairston, FNP sent at 05/06/2017 11:32 AM EST ----- Cholesterol levels are high. You will be prescribed atorvastatin for to lower levels. Recommend f/u in 3 months. Liver function slightly increased. Start eating a diet low in saturated fat. Limit your intake of fried foods, red meats, and whole milk.  Kidney function normal Labs normal. Negative hepatitis C

## 2017-05-11 NOTE — Telephone Encounter (Signed)
Medical Assistant used [employer] to contact patient.  Interpreter Name: Ruel Favorsdgar Interpreter #: [phone] Patient is aware of liver and cholesterol being elevated. Patient advised to pick up atorvastatin to lower levels and increase physical exercise while limiting carb intake. Patient is also aware of kidney function being normal and hep c being negative.

## 2017-06-02 ENCOUNTER — Ambulatory Visit (HOSPITAL_COMMUNITY): Payer: PPO | Admitting: Psychiatry

## 2017-06-02 ENCOUNTER — Ambulatory Visit (INDEPENDENT_AMBULATORY_CARE_PROVIDER_SITE_OTHER): Payer: PPO | Admitting: Psychiatry

## 2017-06-02 ENCOUNTER — Encounter (HOSPITAL_COMMUNITY): Payer: Self-pay | Admitting: Psychiatry

## 2017-06-02 VITALS — BP 120/80 | HR 82 | Ht 68.0 in | Wt 175.0 lb

## 2017-06-02 DIAGNOSIS — M792 Neuralgia and neuritis, unspecified: Secondary | ICD-10-CM

## 2017-06-02 DIAGNOSIS — G4733 Obstructive sleep apnea (adult) (pediatric): Secondary | ICD-10-CM

## 2017-06-02 DIAGNOSIS — Z79899 Other long term (current) drug therapy: Secondary | ICD-10-CM

## 2017-06-02 DIAGNOSIS — G4730 Sleep apnea, unspecified: Secondary | ICD-10-CM

## 2017-06-02 DIAGNOSIS — F333 Major depressive disorder, recurrent, severe with psychotic symptoms: Secondary | ICD-10-CM | POA: Diagnosis not present

## 2017-06-02 DIAGNOSIS — Z8659 Personal history of other mental and behavioral disorders: Secondary | ICD-10-CM | POA: Diagnosis not present

## 2017-06-02 MED ORDER — NORTRIPTYLINE HCL 50 MG PO CAPS
50.0000 mg | ORAL_CAPSULE | Freq: Every day | ORAL | 1 refills | Status: DC
Start: 2017-06-02 — End: 2017-09-02

## 2017-06-02 MED ORDER — ARIPIPRAZOLE 2 MG PO TABS: 2.0000 mg | ORAL_TABLET | Freq: Every day | ORAL | 1 refills | Status: DC

## 2017-06-02 MED ORDER — PAROXETINE HCL 20 MG PO TABS: 20.0000 mg | ORAL_TABLET | Freq: Every day | ORAL | 1 refills | Status: DC

## 2017-06-02 NOTE — Progress Notes (Signed)
BH MD/PA/NP OP Progress Note  06/02/2017 12:27 PM Joshua [name]  MRN:  [id number]  Chief Complaint: med check HPI: Joshua [name] presents with translator for follow-up.  Reports that he is sleeping well with nortriptyline.  He continues to have periodic auditory hallucinations associated with depression.  He is having the follow-up sleep study this week for CPAP titration.  We agreed to increase nortriptyline to 50 to help with ongoing neuromuscular pain, continue Paxil 20 mg, and continue Abilify.  He had yet to start Abilify due to high cost of medication around $90-$100.  Provided a co-pay card that would bring the cost down to a more affordable price.  He continues to practice physical therapy techniques that he learned recently.  Overall, he is taking more charge and taking care of his physical health and mental health.  No acute safety issues or suicidality.  Spent time expressing hope that the sleep apnea treatment will help his depression be more responsive to medications, and patient was understanding and on board with this plan of care.  Visit Diagnosis:    ICD-10-CM   1. Sleep-disordered breathing G47.30   2. 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 History of major depression Z86.59 PARoxetine (PAXIL) 20 MG tablet  4. Obstructive sleep apnea G47.33     Past Psychiatric History: See intake H&P for full details. Reviewed, with no updates at this time.   Past Medical History:  Past Medical History:  Diagnosis Date  . Back pain with right-sided radiculopathy   . High cholesterol    History reviewed. No pertinent surgical history.  Family Psychiatric History: See intake H&P for full details. Reviewed, with no updates at this time.   Family History: History reviewed. No pertinent family history.  Social History:  Social History    Socioeconomic History  . Marital status: Married    Spouse name: None  . Number of children: None  . Years of education: None  . Highest education level: None  Social Needs  . Financial resource strain: None  . Food insecurity - worry: None  . Food insecurity - inability: None  . Transportation needs - medical: None  . Transportation needs - non-medical: None  Occupational History  . None  Tobacco Use  . Smoking status: Never Smoker  . Smokeless tobacco: Never Used  Substance and Sexual Activity  . Alcohol use: No  . Drug use: No  . Sexual activity: Yes    Birth control/protection: None  Other Topics Concern  . None  Social History Narrative  . None    Allergies:  Allergies  Allergen Reactions  . Flexeril [Cyclobenzaprine]     Pt turns very aggressive    Metabolic Disorder Labs: No results found for: HGBA1C, MPG No results found for: PROLACTIN Lab Results  Component Value Date   CHOL 205 (H) 05/01/2017   TRIG 223 (H) 05/01/2017   HDL 34 (L) 05/01/2017   CHOLHDL 6.0 (H) 05/01/2017   LDLCALC 126 (H) 05/01/2017   LDLCALC 142 (H) 09/15/2016   No results found for: TSH  Therapeutic Level Labs: No results found for: LITHIUM No results found for: VALPROATE No components found for:  CBMZ  Current Medications: Current Outpatient Medications  Medication Sig Dispense Refill  . Acetaminophen-Codeine (TYLENOL/CODEINE #3) 300-30 MG tablet Take 1-2 tablets by mouth every 6 (six) hours as needed for pain. 40 tablet 1  .  aspirin 81 MG tablet Take 1 tablet (81 mg total) by mouth daily. 90 tablet 3  . atorvastatin (LIPITOR) 20 MG tablet Take 1 tablet (20 mg total) by mouth daily. 30 tablet 2  . fluticasone (FLONASE) 50 MCG/ACT nasal spray Place into the nose.    . ibuprofen (ADVIL,MOTRIN) 800 MG tablet Take 800 mg by mouth every 8 (eight) hours as needed.    . methocarbamol (ROBAXIN) 500 MG tablet Take 1 tablet (500 mg total) by mouth every 8 (eight) hours as needed for  muscle spasms. 30 tablet 0  . nortriptyline (PAMELOR) 50 MG capsule Take 1 capsule (50 mg total) by mouth at bedtime. 90 capsule 1  . omeprazole (PRILOSEC) 20 MG capsule Take 1 capsule (20 mg total) by mouth daily. 30 capsule 1  . PARoxetine (PAXIL) 20 MG tablet Take 1 tablet (20 mg total) by mouth daily. 90 tablet 1  . ARIPiprazole (ABILIFY) 2 MG tablet Take 1 tablet (2 mg total) by mouth daily. 90 tablet 1   No current facility-administered medications for this visit.      Musculoskeletal: Strength & Muscle Tone: within normal limits Gait & Station: normal Patient leans: N/A  Psychiatric Specialty Exam: ROS  Blood pressure 120/80, pulse 82, height 5\' 8"  (1.727 m), weight 175 lb (79.4 kg).Body mass index is 26.61 kg/m.  General Appearance: Casual and Well Groomed  Eye Contact:  Good  Speech:  Clear and Coherent  Volume:  Normal  Mood:  Euthymic  Affect:  Flat  Thought Process:  Goal Directed and Descriptions of Associations: Intact  Orientation:  Full (Time, Place, and Person)  Thought Content: Logical   Suicidal Thoughts:  No  Homicidal Thoughts:  No  Memory:  Immediate;   Fair  Judgement:  Fair  Insight:  Fair  Psychomotor Activity:  Normal  Concentration:  Attention Span: Good  Recall:  Good  Fund of Knowledge: Good  Language: Good  Akathisia:  Negative  Handed:  Right  AIMS (if indicated): not done  Assets:  Communication Skills Desire for Improvement Financial Resources/Insurance Housing  ADL's:  Intact  Cognition: WNL  Sleep:  Good   Screenings: GAD-7     Office Visit from 05/01/2017 in [facility] Office Visit from 09/12/2016 in [facility] Office Visit from 07/18/2016 in [facility] Office Visit from 06/06/2016 in [facility]  Total GAD-7 Score  15  15  17  21     PHQ2-9     Office Visit from 05/01/2017 in [facility] Office Visit from 09/12/2016 in [facility] Office Visit from 07/18/2016 in [facility] Office Visit from 06/06/2016 in [facility]  PHQ-2 Total Score  4  3  5  6   PHQ-9 Total Score  20  17  13  21        Assessment and Plan:  Joshua [name] presents for med management follow-up.  I suspect a contributing factor to treatment resistant depression has been untreated sleep apnea.  He is scheduled for CPAP titration this week.  We will proceed with an increase in nortriptyline to address issues of chronic pain and further augment Paxil 20 mg daily.  Given that he continues to have some paranoia and periods of auditory hallucinations associated with depression, we will continue Abilify as below.  1. Sleep-disordered breathing  2. Severe episode of recurrent major depressive disorder, with psychotic features (HCC)   3. History of major depression   4. Obstructive sleep apnea     Status of current problems: gradually improving  Labs Ordered: No orders of the defined types were placed in this encounter.   Labs Reviewed: N/A  Collateral Obtained/Records Reviewed: Patient's wife is present and able to provide collateral  Plan:  Continue Paxil 20 mg daily Increase nortriptyline to 50 mg nightly Start Abilify 2 mg daily Return to clinic in 3 months Sleep study CPAP titration this week  I spent 20 minutes with the patient in direct face-to-face clinical care.  Greater than 50% of this time was spent in counseling and coordination of care with the patient.    Burnard LeighAlexander Arya Klaryssa Fauth, MD 06/02/2017, 12:27 PM

## 2017-06-04 ENCOUNTER — Ambulatory Visit (HOSPITAL_BASED_OUTPATIENT_CLINIC_OR_DEPARTMENT_OTHER): Payer: PPO | Attending: Psychiatry | Admitting: Internal Medicine

## 2017-06-04 DIAGNOSIS — G4733 Obstructive sleep apnea (adult) (pediatric): Secondary | ICD-10-CM | POA: Diagnosis not present

## 2017-06-04 DIAGNOSIS — G4761 Periodic limb movement disorder: Secondary | ICD-10-CM | POA: Diagnosis not present

## 2017-06-15 DIAGNOSIS — G4733 Obstructive sleep apnea (adult) (pediatric): Secondary | ICD-10-CM | POA: Diagnosis not present

## 2017-06-15 NOTE — Progress Notes (Signed)
Good morning, can you reach out to the sleep clinic to make sure they send what we need for the CPAP order.  Thank you!

## 2017-06-15 NOTE — Procedures (Signed)
Patient Name: Joshua [name], Joshua [name] Date: 06/04/2017 Gender: Male D.O.B: 1970/08/24 Age (years): 46 Referring Provider: Angus Palms Height (inches): 68 Interpreting Physician: Jetty Duhamel MD, ABSM Weight (lbs): 180 RPSGT: Lise Auer BMI: 27 MRN: [id number] Neck Size: 17.25 CLINICAL INFORMATION The patient is referred for a CPAP titration to treat sleep apnea.  Date of NPSG, Split Night or HST:   NPSG  04/12/17  AHI 41.5/ hr, desaturation to 77%, body weight 180 lbs  SLEEP STUDY TECHNIQUE As per the AASM Manual for the Scoring of Sleep and Associated Events v2.3 (April 2016) with a hypopnea requiring 4% desaturations.  The channels recorded and monitored were frontal, central and occipital EEG, electrooculogram (EOG), submentalis EMG (chin), nasal and oral airflow, thoracic and abdominal wall motion, anterior tibialis EMG, snore microphone, electrocardiogram, and pulse oximetry. Continuous positive airway pressure (CPAP) was initiated at the beginning of the study and titrated to treat sleep-disordered breathing.  MEDICATIONS Medications self-administered by patient taken the night of the study : ACETAMINOPHEN W/CODEINE, METHOCARBAMOL, NORTRIPTYLINE, IBUPROFEN  TECHNICIAN COMMENTS Comments added by technician: Patient tolerated CPAP well Comments added by scorer: N/A RESPIRATORY PARAMETERS Optimal PAP Pressure (cm): 7 AHI at Optimal Pressure (/hr): 0.4 Overall Minimal O2 (%): 86.0 Supine % at Optimal Pressure (%): 94 Minimal O2 at Optimal Pressure (%): 86.0   SLEEP ARCHITECTURE The study was initiated at 10:46:39 PM and ended at 5:07:57 AM.  Sleep onset time was 45.2 minutes and the sleep efficiency was 81.2%%. The total sleep time was 309.6 minutes.  The patient spent 2.9%% of the night in stage N1 sleep, 88.1%% in stage N2 sleep, 0.0%% in stage N3 and 9.04% in REM.Stage REM latency was 155.0 minutes  Wake after sleep onset was 26.5. Alpha intrusion was  absent. Supine sleep was 94.47%.  CARDIAC DATA The 2 lead EKG demonstrated sinus rhythm. The mean heart rate was 66.4 beats per minute. Other EKG findings include: None.  LEG MOVEMENT DATA The total Periodic Limb Movements of Sleep (PLMS) were 439. The PLMS index was 85.1. A PLMS index of <15 is considered normal in adults.  IMPRESSIONS - The optimal PAP pressure was 7 cm of water. - Central sleep apnea was not noted during this titration (CAI = 0.4/h). - Moderate oxygen desaturations were observed during this titration (min O2 = 86.0%). Mean at CPAP 7 was 93.1%. - The patient snored with soft snoring volume during this titration study. - No cardiac abnormalities were observed during this study. - Clinically significant periodic limb movements were noted during this study. Arousals associated with PLMs were significant.  DIAGNOSIS - Obstructive Sleep Apnea (327.23 [G47.33 ICD-10]) - Periodic Limb Movement During Sleep (327.51 [G47.61 ICD-10])  RECOMMENDATIONS - Trial of CPAP therapy on 7 cm H2O with a Medium size Resmed Full Face Mask AirFit F20 mask and heated humidification. - Consider treating limb movement sleep disturbance if persistent after adjusting to CPAP. - Be careful with alcohol, sedatives and other CNS depressants that may worsen sleep apnea and disrupt normal sleep architecture. - Sleep hygiene should be reviewed to assess factors that may improve sleep quality. - Weight management and regular exercise should be initiated or continued.  [Electronically signed] 06/15/2017 07:45 AM  Jetty Duhamel MD, ABSM Diplomate, American Board of Sleep Medicine   NPI: [id number]                          Jetty Duhamel Diplomate, American Board of Sleep Medicine  ELECTRONICALLY SIGNED  ON:  06/15/2017, 7:37 AM [facility] PH: [phone]   FX: [phone] ACCREDITED BY THE AMERICAN ACADEMY OF SLEEP MEDICINE

## 2017-06-25 ENCOUNTER — Ambulatory Visit: Payer: Self-pay | Admitting: Nurse Practitioner

## 2017-07-08 ENCOUNTER — Other Ambulatory Visit: Payer: Self-pay | Admitting: Pharmacist

## 2017-07-08 DIAGNOSIS — E782 Mixed hyperlipidemia: Secondary | ICD-10-CM

## 2017-07-08 MED ORDER — ATORVASTATIN CALCIUM 20 MG PO TABS: 20.0000 mg | ORAL_TABLET | Freq: Every day | ORAL | 0 refills | Status: DC

## 2017-07-28 ENCOUNTER — Encounter (HOSPITAL_COMMUNITY): Payer: Self-pay | Admitting: Psychiatry

## 2017-07-28 NOTE — Telephone Encounter (Signed)
Okay great!

## 2017-07-28 NOTE — Telephone Encounter (Signed)
Hey did we get anything from him on this?

## 2017-07-29 ENCOUNTER — Ambulatory Visit: Payer: PPO | Attending: Family Medicine | Admitting: Physician Assistant

## 2017-07-29 DIAGNOSIS — M5441 Lumbago with sciatica, right side: Secondary | ICD-10-CM | POA: Diagnosis not present

## 2017-07-29 DIAGNOSIS — G8929 Other chronic pain: Secondary | ICD-10-CM | POA: Insufficient documentation

## 2017-07-29 DIAGNOSIS — M549 Dorsalgia, unspecified: Secondary | ICD-10-CM | POA: Diagnosis present

## 2017-07-29 MED ORDER — NAPROXEN 500 MG PO TABS: 500.0000 mg | ORAL_TABLET | Freq: Two times a day (BID) | ORAL | 0 refills | Status: DC

## 2017-07-29 MED ORDER — METHOCARBAMOL 500 MG PO TABS
1000.0000 mg | ORAL_TABLET | Freq: Three times a day (TID) | ORAL | 1 refills | Status: DC | PRN
Start: 2017-07-29 — End: 2017-09-21

## 2017-07-29 MED ORDER — ACETAMINOPHEN-CODEINE 300-30 MG PO TABS: 1.0000 | ORAL_TABLET | Freq: Four times a day (QID) | ORAL | 0 refills | Status: DC | PRN

## 2017-07-29 NOTE — Progress Notes (Signed)
Patient is here of his lower pain, whole back pain.   Pt. Stated his right leg are numb.   Pt. Stated he have dandruff on his head.

## 2017-07-29 NOTE — Patient Instructions (Signed)
Selsun Blue   Selenium Sulfide shampoo Qu es este medicamento? El [place] de SULFURO DE SELENIO se [place] para tratar la caspa, las infecciones micticas del cuero cabelludo y de la piel y la seborrea del cuero cabelludo. Este medicamento puede ser utilizado para otros usos; si tiene alguna pregunta consulte con su proveedor de atencin mdica o con su farmacutico. MARCAS COMUNES: Anti-Dandruff, Dandrex, Selenos, SelRx, Selseb, Selsun, Selsun ITT IndustriesBlue Qu le debo informar a mi profesional de la salud antes de tomar este medicamento? Necesita saber si usted presenta alguno de los siguientes problemas o situaciones: -reas abiertas o inflamadas de la piel -una reaccin alrgica o inusual al sulfuro de selenio, a otros medicamentos, alimentos, colorantes o conservantes -si est embarazada o buscando quedar embarazada -si est amamantando a un beb Cmo debo utilizar este medicamento? Este medicamento es slo para uso externo. No lo ingiera por va oral. Agite bien antes de usar. Siga las instrucciones de la etiqueta del [place] Citymedicamento. Lvese las manos antes y despus de usarlo. Evite que este medicamento entre en contacto con los ojos. Si esto ocurre, enjuguelos con abundante agua fra del grifo. Evite el uso de este medicamento en el rea genital. No lo use en reas inflamadas o resquebrajadas de la piel. No utilice su medicamento con una frecuencia mayor a la indicada o durante un perodo de tiempo mayor que el indicado por su mdico o por su profesional de Beazer Homesla salud. Si lo hace aumentarn las probabilidades de sufrir efectos secundarios. Hable con su pediatra para informarse acerca del uso de este medicamento en nios. Puede requerir atencin especial. Sobredosis: Pngase en contacto inmediatamente con un centro toxicolgico o una sala de urgencia si usted cree que haya tomado demasiado medicamento. ATENCIN: Reynolds AmericanEste medicamento es solo para usted. No comparta este medicamento con nadie. Qu sucede si me  olvido de una dosis? Si olvida una dosis, aplquela lo antes posible. Si es casi la hora de la prxima dosis, aplique slo esa dosis. No use dosis adicionales o dobles. Qu puede interactuar con este medicamento? No se esperan interacciones. No utilice otros productos de la piel sobre la zona afectada sin Science writerconsultar a su mdico o a su profesional de Radiographer, therapeuticla salud. Puede ser que esta lista no menciona todas las posibles interacciones. Informe a su profesional de Beazer Homesla salud de [employer] Inctodos los productos a base de hierbas, medicamentos de [place] libre o suplementos nutritivos que est tomando. Si usted fuma, consume bebidas alcohlicas o si utiliza drogas ilegales, indqueselo tambin a su profesional de Beazer Homesla salud. Algunas sustancias pueden interactuar con su medicamento. A qu debo estar atento al usar PPL Corporationeste medicamento? Si los sntomas no mejoran despus de [address]dos semanas, informe a su mdico o a su profesional de Beazer Homesla salud. Si lo [place] en cabellos rubios, aclarados, teidos, canosos o con permanente, enjuguelo al menos durante 5 minutos para reducir la probablidad de que se decoloren. Este medicamento no debe usarse durante 48 horas despus de Contractoraplicar soluciones para rizado Production assistant, radiopermanente o para teir el cabello. Este medicamento puede daar las [place]hajas. Quitarlas antes de usarlo. Qu efectos secundarios puedo tener al Boston Scientificutilizar este medicamento? Efectos secundarios que debe informar a su mdico o a Producer, television/film/videosu profesional de la salud tan pronto como sea posible: -cuando el problema de la piel no se cura -enrojecimiento o irritacin severos de la piel luego del uso de este medicamento Efectos secundarios que, por lo general, no requieren Psychologist, prison and probation servicesatencin mdica (debe informarlos a su mdico o a Producer, television/film/videosu profesional de la salud si persisten  o si son molestos): -irritacin menor de la piel Puede ser que esta lista no menciona todos los posibles efectos secundarios. Comunquese a su mdico por asesoramiento mdico [employer]lett[employer]ard. Usted  puede informar los efectos secundarios a la FDA por telfono al 1-800-FDA-1088. Dnde debo guardar mi medicina? Mantngala fuera del alcance de los nios. Gurdela a Sanmina-SCI, entre 15 y 30 grados C (36 y 41 grados F). Mantenga el envase bien cerrado. Deseche todos los medicamentos que no haya utilizado, despus de la fecha de vencimiento. ATENCIN: Este folleto es un resumen. Puede ser que no cubra toda la posible informacin. Si usted tiene preguntas acerca de esta medicina, consulte con su mdico, su farmacutico o su profesional de Radiographer, therapeutic.  2018 Elsevier/Gold Standard (2014-05-23 00:00:00)

## 2017-07-29 NOTE — Progress Notes (Signed)
Patient ID: Joshua [name], male   DOB: 1971-04-08, 47 y.o.   MRN: [id number]     Alieu Finnigan, is a 47 y.o. male  WUJ:[id number]  NFA:[id number]  DOB - April 18, 1970  Subjective:  Chief Complaint and HPI: Joshua [name] is a 47 y.o. male here today C/o R back pain that is ongoing.  See previous MRI.  Ibuprofen, tylenol #3, and methocarbamol only helping some.  This is a chronic and ongoing problem.    Has a psychiatrist.  Does not want to see Jenel Lucks today.  Denies current SI/HI.    [employer] interpreters used.    Also having trouble with dandruff and itchy scalp   ROS:   Constitutional:  No f/c, No night sweats, No unexplained weight loss. EENT:  No vision changes, No blurry vision, No hearing changes. No mouth, throat, or ear problems.  Respiratory: No cough, No SOB Cardiac: No CP, no palpitations GI:  No abd pain, No N/V/D. GU: No Urinary s/sx Musculoskeletal: +black  pain Neuro: No headache, no dizziness, no motor weakness.  Skin: +itchy scalp Endocrine:  No polydipsia. No polyuria.  Psych: Denies SI/HI  No problems updated.  ALLERGIES: Allergies  Allergen Reactions  . Flexeril [Cyclobenzaprine]     Pt turns very aggressive    PAST MEDICAL HISTORY: Past Medical History:  Diagnosis Date  . Back pain with right-sided radiculopathy   . High cholesterol     MEDICATIONS AT HOME: Prior to Admission medications   Medication Sig Start Date End Date Taking? Authorizing Provider  Acetaminophen-Codeine (TYLENOL/CODEINE #3) 300-30 MG tablet Take 1-2 tablets by mouth every 6 (six) hours as needed for pain. 07/29/17  Yes Georgian Co M, PA-C  ARIPiprazole (ABILIFY) 2 MG tablet Take 1 tablet (2 mg total) by mouth daily. 06/02/17  Yes Eksir, Bo Mcclintock, MD  aspirin 81 MG tablet Take 1 tablet (81 mg total) by mouth daily. 09/12/16  Yes Hairston, Oren Beckmann, FNP  atorvastatin (LIPITOR) 20 MG tablet Take 1 tablet (20 mg total) by mouth daily. 07/08/17  Yes Quentin Angst, MD  fluticasone (FLONASE) 50 MCG/ACT nasal spray Place into the nose. 08/15/16 08/15/17 Yes [provider]  methocarbamol (ROBAXIN) 500 MG tablet Take 2 tablets (1,000 mg total) by mouth every 8 (eight) hours as needed for muscle spasms. 07/29/17  Yes Anders Simmonds, PA-C  nortriptyline (PAMELOR) 50 MG capsule Take 1 capsule (50 mg total) by mouth at bedtime. 06/02/17 06/02/18 Yes Eksir, Bo Mcclintock, MD  omeprazole (PRILOSEC) 20 MG capsule Take 1 capsule (20 mg total) by mouth daily. 05/01/17  Yes Hairston, Leonia Reeves R, FNP  PARoxetine (PAXIL) 20 MG tablet Take 1 tablet (20 mg total) by mouth daily. 06/02/17  Yes Eksir, Bo Mcclintock, MD  naproxen (NAPROSYN) 500 MG tablet Take 1 tablet (500 mg total) by mouth 2 (two) times daily with a meal. 07/29/17   Anders Simmonds, PA-C     Objective:  EXAM:   Vitals:   07/29/17 1536  BP: 132/89  Pulse: 92  Resp: 16  Temp: 98.1 F (36.7 C)  TempSrc: Oral  SpO2: 94%  Weight: 174 lb 12.8 oz (79.3 kg)    General appearance : A&OX3. NAD. Non-toxic-appearing HEENT: Atraumatic and Normocephalic.  PERRLA. EOM intact.  Neck: supple, no JVD. No cervical lymphadenopathy. No thyromegaly Chest/Lungs:  Breathing-non-labored, Good air entry bilaterally, breath sounds normal without rales, rhonchi, or wheezing  CVS: S1 S2 regular, no murmurs, gallops, rubs  Walks with cane Extremities: Bilateral Lower Ext  shows no edema, both legs are warm to touch with = pulse throughout Neurology:  CN II-XII grossly intact, Non focal.   Psych:  TP linear. J/I WNL. Normal speech. Appropriate eye contact and affect.  Skin:  Dry scalp and dandruff  Data Review No results found for: HGBA1C   Assessment & Plan   1. Chronic bilateral low back pain with right-sided sciatica unchanges-no new red flags - Acetaminophen-Codeine (TYLENOL/CODEINE #3) 300-30 MG tablet; Take 1-2 tablets by mouth every 6 (six) hours as needed for pain.  Dispense: 40 tablet;  Refill: 0 Increase dose- methocarbamol (ROBAXIN) 500 MG tablet; Take 2 tablets (1,000 mg total) by mouth every 8 (eight) hours as needed for muscle spasms.  Dispense: 90 tablet; Refill: 1 Stop Ibuprofen-will try- naproxen (NAPROSYN) 500 MG tablet; Take 1 tablet (500 mg total) by mouth 2 (two) times daily with a meal.  Dispense: 60 tablet; Refill: 0 - Ambulatory referral to Pain Clinic     Patient have been counseled extensively about nutrition and exercise  Return in about 1 week (around 08/05/2017) for assign new PCP for papers to be filled out.  The patient was given clear instructions to go to ER or return to medical center if symptoms don't improve, worsen or new problems develop. The patient verbalized understanding. The patient was told to call to get lab results if they haven't heard anything in the next week.   Georgian CoAngela Nazareth Kirk, PA-C [facility] [place] , [place] [phone]   07/29/2017, 4:16 PM

## 2017-07-30 ENCOUNTER — Ambulatory Visit: Payer: Self-pay | Admitting: Family Medicine

## 2017-08-03 ENCOUNTER — Other Ambulatory Visit: Payer: Self-pay | Admitting: Pharmacist

## 2017-08-03 DIAGNOSIS — M5441 Lumbago with sciatica, right side: Secondary | ICD-10-CM

## 2017-08-03 DIAGNOSIS — G8929 Other chronic pain: Secondary | ICD-10-CM

## 2017-08-03 DIAGNOSIS — K219 Gastro-esophageal reflux disease without esophagitis: Secondary | ICD-10-CM

## 2017-08-03 MED ORDER — OMEPRAZOLE 20 MG PO CPDR: 20.0000 mg | DELAYED_RELEASE_CAPSULE | Freq: Every day | ORAL | 1 refills | Status: DC

## 2017-09-01 ENCOUNTER — Encounter

## 2017-09-02 ENCOUNTER — Encounter (HOSPITAL_COMMUNITY): Payer: Self-pay | Admitting: Psychiatry

## 2017-09-02 ENCOUNTER — Ambulatory Visit: Payer: Self-pay | Admitting: Family Medicine

## 2017-09-02 ENCOUNTER — Ambulatory Visit (INDEPENDENT_AMBULATORY_CARE_PROVIDER_SITE_OTHER): Payer: PPO | Admitting: Psychiatry

## 2017-09-02 DIAGNOSIS — F333 Major depressive disorder, recurrent, severe with psychotic symptoms: Secondary | ICD-10-CM

## 2017-09-02 DIAGNOSIS — Z79899 Other long term (current) drug therapy: Secondary | ICD-10-CM

## 2017-09-02 DIAGNOSIS — Z9989 Dependence on other enabling machines and devices: Secondary | ICD-10-CM | POA: Diagnosis not present

## 2017-09-02 DIAGNOSIS — G479 Sleep disorder, unspecified: Secondary | ICD-10-CM | POA: Diagnosis not present

## 2017-09-02 DIAGNOSIS — Z8659 Personal history of other mental and behavioral disorders: Secondary | ICD-10-CM

## 2017-09-02 MED ORDER — TRAZODONE HCL 50 MG PO TABS: 50.0000 mg | ORAL_TABLET | Freq: Every day | ORAL | 0 refills | Status: DC

## 2017-09-02 MED ORDER — ARIPIPRAZOLE 2 MG PO TABS: 2.0000 mg | ORAL_TABLET | Freq: Every day | ORAL | 1 refills | Status: DC

## 2017-09-02 MED ORDER — PAROXETINE HCL 40 MG PO TABS: 40.0000 mg | ORAL_TABLET | Freq: Every day | ORAL | 0 refills | Status: DC

## 2017-09-02 MED ORDER — NORTRIPTYLINE HCL 50 MG PO CAPS: 50.0000 mg | ORAL_CAPSULE | Freq: Every day | ORAL | 1 refills | Status: DC

## 2017-09-02 NOTE — Progress Notes (Signed)
BH MD/PA/NP OP Progress Note  09/02/2017 2:43 PM Joshua [name]  MRN:  [id number]  Chief Complaint: Waiting on CPAP, some difficulty sleeping HPI: Joshua [name] does report that his mood and depression are gradually improving, but he continues to have anxiety and nervousness throughout the day, feels panicked and jittery.  We discussed increasing Paxil to 40 mg to target panic and anxiety symptoms.  He continues to have frequent awakenings throughout the night, and is also waiting on his CPAP to be delivered and set up  We agreed to initiate trazodone 50 mg nightly for frequent awakenings, and we will follow-up with the sleep clinic regarding CPAP to see how we can support this getting ordered and sent over as soon as possible.  I also encouraged him to coordinate with his primary care provider who is going to meet soon, given there was a recent change in providers.  I also shared with patient that I am leaving this office at the end of August and we discussed the transition plan of care after that time.  No acute safety issues, overall feels like he is doing better than he was 6-8 months ago.  Visit Diagnosis:    ICD-10-CM   1. History of major depression Z86.59 PARoxetine (PAXIL) 40 MG tablet    traZODone (DESYREL) 50 MG tablet  2. 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 Reviewed, with no updates at this time.   Past Medical History:  Past Medical History:  Diagnosis Date  . Back pain with right-sided radiculopathy   . High cholesterol    History reviewed. No pertinent surgical history.  Family Psychiatric History: See intake H&P for full details. Reviewed, with no updates at this time.   Family History: History reviewed. No pertinent family history.  Social  History:  Social History   Socioeconomic History  . Marital status: Married    Spouse name: Not on file  . Number of children: Not on file  . Years of education: Not on file  . Highest education level: Not on file  Occupational History  . Not on file  Social Needs  . Financial resource strain: Not on file  . Food insecurity:    Worry: Not on file    Inability: Not on file  . Transportation needs:    Medical: Not on file    Non-medical: Not on file  Tobacco Use  . Smoking status: Never Smoker  . Smokeless tobacco: Never Used  Substance and Sexual Activity  . Alcohol use: No  . Drug use: No  . Sexual activity: Yes    Birth control/protection: None  Lifestyle  . Physical activity:    Days per week: Not on file    Minutes per session: Not on file  . Stress: Not on file  Relationships  . Social connections:    Talks on phone: Not on file    Gets together: Not on file    Attends religious service: Not on file    Active member of club or organization: Not on file    Attends meetings of clubs or organizations: Not on file    Relationship status: Not on file  Other Topics Concern  . Not on file  Social History Narrative  . Not on file  Allergies:  Allergies  Allergen Reactions  . Flexeril [Cyclobenzaprine]     Pt turns very aggressive    Metabolic Disorder Labs: No results found for: HGBA1C, MPG No results found for: PROLACTIN Lab Results  Component Value Date   CHOL 205 (H) 05/01/2017   TRIG 223 (H) 05/01/2017   HDL 34 (L) 05/01/2017   CHOLHDL 6.0 (H) 05/01/2017   LDLCALC 126 (H) 05/01/2017   LDLCALC 142 (H) 09/15/2016   No results found for: TSH  Therapeutic Level Labs: No results found for: LITHIUM No results found for: VALPROATE No components found for:  CBMZ  Current Medications: Current Outpatient Medications  Medication Sig Dispense Refill  . Acetaminophen-Codeine (TYLENOL/CODEINE #3) 300-30 MG tablet Take 1-2 tablets by mouth every 6 (six)  hours as needed for pain. 40 tablet 0  . ARIPiprazole (ABILIFY) 2 MG tablet Take 1 tablet (2 mg total) by mouth daily. 90 tablet 1  . aspirin 81 MG tablet Take 1 tablet (81 mg total) by mouth daily. 90 tablet 3  . atorvastatin (LIPITOR) 20 MG tablet Take 1 tablet (20 mg total) by mouth daily. 90 tablet 0  . fluticasone (FLONASE) 50 MCG/ACT nasal spray Place into the nose.    . methocarbamol (ROBAXIN) 500 MG tablet Take 2 tablets (1,000 mg total) by mouth every 8 (eight) hours as needed for muscle spasms. 90 tablet 1  . naproxen (NAPROSYN) 500 MG tablet Take 1 tablet (500 mg total) by mouth 2 (two) times daily with a meal. 60 tablet 0  . nortriptyline (PAMELOR) 50 MG capsule Take 1 capsule (50 mg total) by mouth at bedtime. 90 capsule 1  . omeprazole (PRILOSEC) 20 MG capsule Take 1 capsule (20 mg total) by mouth daily. 30 capsule 1  . PARoxetine (PAXIL) 40 MG tablet Take 1 tablet (40 mg total) by mouth daily. 90 tablet 0  . traZODone (DESYREL) 50 MG tablet Take 1 tablet (50 mg total) by mouth at bedtime. 90 tablet 0   No current facility-administered medications for this visit.      Musculoskeletal: Strength & Muscle Tone: within normal limits Gait & Station: normal Patient leans: N/A  Psychiatric Specialty Exam: ROS  Blood pressure 128/80, pulse 77, height  (1.727 m), weight 174 lb 12.8 oz (79.3 kg).Body mass index is 26.58 kg/m.  General Appearance: Casual and Well Groomed  Eye Contact:  Good  Speech:  Clear and Coherent  Volume:  Normal  Mood:  Anxious  Affect:  Congruent  Thought Process:  Goal Directed and Descriptions of Associations: Intact  Orientation:  Full (Time, Place, and Person)  Thought Content: Logical   Suicidal Thoughts:  No  Homicidal Thoughts:  No  Memory:  Immediate;   Fair  Judgement:  Fair  Insight:  Good  Psychomotor Activity:  Normal  Concentration:  Concentration: Good  Recall:  Good  Fund of Knowledge: Good  Language: Good  Akathisia:   Negative  Handed:  Right  AIMS (if indicated): not done  Assets:  Communication Skills Desire for Improvement Financial Resources/Insurance Housing  ADL's:  Intact  Cognition: WNL  Sleep:  Poor   Screenings: GAD-7     Office Visit from 07/29/2017 in [facility] Office Visit from 05/01/2017 in [facility] Office Visit from 09/12/2016 in [facility] Office Visit from 07/18/2016 in [facility] Office Visit from 06/06/2016 in [facility]  Total  GAD-7 Score  PHQ2-9     Office Visit from 07/29/2017 in [facility] Office Visit from 05/01/2017 in [facility] Office Visit from 09/12/2016 in [facility] Office Visit from 07/18/2016 in [facility] Office Visit from 06/06/2016 in [facility]  PHQ-2 Total Score  PHQ-9 Total Score  Assessment and Plan:  Joshua [name] has gradual improvements in depressive symptoms, but ongoing difficulty with anxiety and episodic panic.  We agreed to up titrate Paxil as below.  Has some ongoing insomnia symptoms, and we agreed to initiate trazodone 50 mg nightly.  Reviewed the risks and benefits including potential for priapism.  He is also still awaiting his CPAP machine after learning that he needs a CPAP with the sleep clinic.  No acute safety issues and we will follow-up in 8-10-week.  I disclosed to him that I am leaving clinic at the end of August, and we spent some time sharing his grief and anxiety, and discussing a transition plan of care.  1. History of major depression   2. Severe episode of recurrent major depressive disorder, with psychotic features (HCC)     Status of current problems: gradually  improving  Labs Ordered: No orders of the defined types were placed in this encounter.   Labs Reviewed: na  Collateral Obtained/Records Reviewed: wife is present, translator present to ensure good communication  Plan:  Continue Pamelor 50 mg nightly Continue Abilify 2 mg nightly Increase Paxil to 40 mg nightly Initiate trazodone 50 mg nightly Return to clinic in 8-10 weeks Follow-up on CPAP orders  I spent 25 minutes with the patient in direct face-to-face clinical care.  Greater than 50% of this time was spent in counseling and coordination of care with the patient.    Burnard Leigh, MD 09/02/2017, 2:43 PM

## 2017-09-03 ENCOUNTER — Encounter: Payer: Self-pay | Admitting: Physical Medicine & Rehabilitation

## 2017-09-15 ENCOUNTER — Encounter: Payer: PPO | Attending: Physical Medicine & Rehabilitation

## 2017-09-15 DIAGNOSIS — M25551 Pain in right hip: Secondary | ICD-10-CM | POA: Insufficient documentation

## 2017-09-15 DIAGNOSIS — M545 Low back pain: Secondary | ICD-10-CM | POA: Insufficient documentation

## 2017-09-15 DIAGNOSIS — M5117 Intervertebral disc disorders with radiculopathy, lumbosacral region: Secondary | ICD-10-CM | POA: Insufficient documentation

## 2017-09-15 DIAGNOSIS — G8929 Other chronic pain: Secondary | ICD-10-CM | POA: Insufficient documentation

## 2017-09-21 ENCOUNTER — Ambulatory Visit: Payer: PPO | Attending: Nurse Practitioner | Admitting: Nurse Practitioner

## 2017-09-21 ENCOUNTER — Encounter: Payer: Self-pay | Admitting: Nurse Practitioner

## 2017-09-21 VITALS — BP 132/94 | HR 93 | Temp 98.3°F | Ht 68.0 in | Wt 178.8 lb

## 2017-09-21 DIAGNOSIS — Z833 Family history of diabetes mellitus: Secondary | ICD-10-CM | POA: Diagnosis not present

## 2017-09-21 DIAGNOSIS — Z7982 Long term (current) use of aspirin: Secondary | ICD-10-CM | POA: Insufficient documentation

## 2017-09-21 DIAGNOSIS — F32A Depression, unspecified: Secondary | ICD-10-CM

## 2017-09-21 DIAGNOSIS — G4700 Insomnia, unspecified: Secondary | ICD-10-CM | POA: Insufficient documentation

## 2017-09-21 DIAGNOSIS — M5441 Lumbago with sciatica, right side: Secondary | ICD-10-CM | POA: Diagnosis not present

## 2017-09-21 DIAGNOSIS — E782 Mixed hyperlipidemia: Secondary | ICD-10-CM

## 2017-09-21 DIAGNOSIS — Z888 Allergy status to other drugs, medicaments and biological substances status: Secondary | ICD-10-CM | POA: Insufficient documentation

## 2017-09-21 DIAGNOSIS — Z79899 Other long term (current) drug therapy: Secondary | ICD-10-CM | POA: Insufficient documentation

## 2017-09-21 DIAGNOSIS — Z7951 Long term (current) use of inhaled steroids: Secondary | ICD-10-CM | POA: Insufficient documentation

## 2017-09-21 DIAGNOSIS — F329 Major depressive disorder, single episode, unspecified: Secondary | ICD-10-CM | POA: Diagnosis not present

## 2017-09-21 DIAGNOSIS — G8929 Other chronic pain: Secondary | ICD-10-CM

## 2017-09-21 DIAGNOSIS — Z8249 Family history of ischemic heart disease and other diseases of the circulatory system: Secondary | ICD-10-CM | POA: Diagnosis not present

## 2017-09-21 DIAGNOSIS — F419 Anxiety disorder, unspecified: Secondary | ICD-10-CM | POA: Insufficient documentation

## 2017-09-21 DIAGNOSIS — M542 Cervicalgia: Secondary | ICD-10-CM | POA: Diagnosis not present

## 2017-09-21 MED ORDER — NAPROXEN 500 MG PO TABS: 500.0000 mg | ORAL_TABLET | Freq: Two times a day (BID) | ORAL | 1 refills | Status: DC

## 2017-09-21 MED ORDER — METHOCARBAMOL 500 MG PO TABS: 1000.0000 mg | ORAL_TABLET | Freq: Three times a day (TID) | ORAL | 1 refills | Status: DC | PRN

## 2017-09-21 MED ORDER — ATORVASTATIN CALCIUM 20 MG PO TABS: 20.0000 mg | ORAL_TABLET | Freq: Every day | ORAL | 1 refills | Status: DC

## 2017-09-21 NOTE — Progress Notes (Signed)
Assessment & Plan:  Joshua [name] was seen today for establish care.  Diagnoses and all orders for this visit:  Mixed hyperlipidemia -     atorvastatin (LIPITOR) 20 MG tablet; Take 1 tablet (20 mg total) by mouth daily. INSTRUCTIONS: Work on a low fat, heart healthy diet and participate in regular aerobic exercise program by working out at least 150 minutes per week. No fried foods. No junk foods, sodas, sugary drinks, unhealthy snacking, alcohol or smoking.    Anxiety and depression Continue medications as prescribed by psychiatry.   Chronic bilateral low back pain with right-sided sciatica -     methocarbamol (ROBAXIN) 500 MG tablet; Take 2 tablets (1,000 mg total) by mouth every 8 (eight) hours as needed for muscle spasms. -     naproxen (NAPROSYN) 500 MG tablet; Take 1 tablet (500 mg total) by mouth 2 (two) times daily with a meal. Work on losing weight to help reduce back pain. May alternate with heat and ice application for pain relief. May also alternate with acetaminophen  as prescribed for back pain. Other alternatives include massage, acupuncture and water aerobics.  You must stay active and avoid a sedentary lifestyle.   Patient has been counseled on age-appropriate routine health concerns for screening and prevention. These are reviewed and up-to-date. Referrals have been placed accordingly. Immunizations are up-to-date or declined.    Subjective:   Chief Complaint  Patient presents with  . Establish Care    Pt. is here for back pain and neck pain.    HPI Joshua [name] 47 y.o. male presents to office today to establish care. VRI was used to communicate directly with patient for the entire encounter including providing detailed patient instructions.   Chronic Neck and Back Pain He has an appointment with pain management on 09-27-2017. He has chronic neck and  back pain and is currently taking Tylenol #3, methocarbamol 1000 mg every 8 hours prn, naproxen 500 mg BID He uses a  cane for walking.  His sciatica is greater on the right.  Denies any involuntary loss of bowel and bladder. [place] the [place]  MRI 09-06-2016: 1. At L5-S1 there is a small right paracentral disc protrusion contacting the right intraspinal S1 nerve root. Mild bilateral facet arthropathy.   Anxiety and Depression  He sees psychiatry for major depression, insomnia,periodic auditory hallucinations (taking Abilify) and anxiety. Taking  Pamelor 50 mg,  paxil 20 mg, trazodone 50mg  and abilify 2 mg daily.   Hyperlipidemia Patient presents for follow up to hyperlipidemia.  He is medication compliant. He is diet compliant and denies lower extremity edema, palpitations, poor exercise tolerance and skin xanthelasma or statin intolerance including myalgias. Needs fasting labs.  Lab Results  Component Value Date   CHOL 205 (H) 05/01/2017   Lab Results  Component Value Date   HDL 34 (L) 05/01/2017   Lab Results  Component Value Date   LDLCALC 126 (H) 05/01/2017   Lab Results  Component Value Date   TRIG 223 (H) 05/01/2017   Lab Results  Component Value Date   CHOLHDL 6.0 (H) 05/01/2017   Review of Systems  Constitutional: Negative for fever, malaise/fatigue and weight loss.  HENT: Negative.  Negative for nosebleeds.   Eyes: Negative.  Negative for blurred vision, double vision and photophobia.  Respiratory: Negative.  Negative for cough and shortness of breath.   Cardiovascular: Negative.  Negative for chest pain, palpitations and leg swelling.  Gastrointestinal: Negative.  Negative for heartburn, nausea and vomiting.  Musculoskeletal: Positive for back pain, joint pain, myalgias and neck pain.  Neurological: Negative.  Negative for dizziness, focal weakness, seizures and headaches.  Psychiatric/Behavioral: Positive for depression and hallucinations (currently denies). Negative for memory loss and suicidal ideas. The patient is nervous/anxious and has  insomnia.     Past Medical History:  Diagnosis Date  . Back pain with right-sided radiculopathy   . High cholesterol     History reviewed. No pertinent surgical history.  Family History  Problem Relation Age of Onset  . Hypertension Paternal Uncle   . Diabetes Paternal Uncle     Social History Reviewed with no changes to be made today.   Outpatient Medications Prior to Visit  Medication Sig Dispense Refill  . Acetaminophen-Codeine (TYLENOL/CODEINE #3) 300-30 MG tablet Take 1-2 tablets by mouth every 6 (six) hours as needed for pain. 40 tablet 0  . ARIPiprazole (ABILIFY) 2 MG tablet Take 1 tablet (2 mg total) by mouth daily. 90 tablet 1  . aspirin 81 MG tablet Take 1 tablet (81 mg total) by mouth daily. 90 tablet 3  . nortriptyline (PAMELOR) 50 MG capsule Take 1 capsule (50 mg total) by mouth at bedtime. 90 capsule 1  . omeprazole (PRILOSEC) 20 MG capsule Take 1 capsule (20 mg total) by mouth daily. 30 capsule 1  . PARoxetine (PAXIL) 40 MG tablet Take 1 tablet (40 mg total) by mouth daily. 90 tablet 0  . traZODone (DESYREL) 50 MG tablet Take 1 tablet (50 mg total) by mouth at bedtime. 90 tablet 0  . atorvastatin (LIPITOR) 20 MG tablet Take 1 tablet (20 mg total) by mouth daily. 90 tablet 0  . methocarbamol (ROBAXIN) 500 MG tablet Take 2 tablets (1,000 mg total) by mouth every 8 (eight) hours as needed for muscle spasms. 90 tablet 1  . naproxen (NAPROSYN) 500 MG tablet Take 1 tablet (500 mg total) by mouth 2 (two) times daily with a meal. 60 tablet 0  . fluticasone (FLONASE) 50 MCG/ACT nasal spray Place into the nose.     No facility-administered medications prior to visit.     Allergies  Allergen Reactions  . Flexeril [Cyclobenzaprine]     Pt turns very aggressive       Objective:    BP (!) 132/94 (BP Location: Right Arm, Patient Position: Sitting, Cuff Size: Normal)   Pulse 93   Temp 98.3 F (36.8 C) (Oral)   Ht 5\' 8"  (1.727 m)   Wt 178 lb 12.8 oz (81.1 kg)   SpO2  95%   BMI 27.19 kg/m  Wt Readings from Last 3 Encounters:  09/21/17 178 lb 12.8 oz (81.1 kg)  09/02/17 174 lb 12.8 oz (79.3 kg)  07/29/17 174 lb 12.8 oz (79.3 kg)    Physical Exam  Constitutional: He is oriented to person, place, and time. He appears well-developed and well-nourished. He is cooperative.  HENT:  Head: Normocephalic and atraumatic.  Eyes: EOM are normal.  Neck: Normal range of motion.  Cardiovascular: Normal rate, regular rhythm and normal heart sounds. Exam reveals no gallop and no friction rub.  No murmur heard. Pulmonary/Chest: Effort normal and breath sounds normal. No tachypnea. No respiratory distress. He has no decreased breath sounds. He has no wheezes. He has no rhonchi. He has no rales. He exhibits no tenderness.  Abdominal: Soft. Bowel sounds are normal.  Musculoskeletal: Normal range of motion. He exhibits no edema.       Lumbar back: He exhibits  pain.  Neurological: He is alert and oriented to person, place, and time. Gait abnormal. Coordination normal.  Skin: Skin is warm and dry.  Psychiatric: He has a normal mood and affect. His behavior is normal. Judgment and thought content normal.  Nursing note and vitals reviewed.      Patient has been counseled extensively about nutrition and exercise as well as the importance of adherence with medications and regular follow-up. The patient was given clear instructions to go to ER or return to medical center if symptoms don't improve, worsen or new problems develop. The patient verbalized understanding.   Follow-up: Return for see me in 6-8 weeks for f/u back pain and fasting labs. Needs morning appt. Claiborne Rigg, FNP-BC [facility] [place], [place] [phone]   09/21/2017, 5:47 PM

## 2017-09-21 NOTE — Patient Instructions (Signed)
Sciatica Sciatica is pain, numbness, weakness, or tingling along your sciatic nerve. The sciatic nerve starts in the lower back and goes down the back of each leg. Sciatica happens when this nerve is pinched or has pressure put on it. Sciatica usually goes away on its own or with treatment. Sometimes, sciatica may keep coming back (recur). Follow these instructions at home: Medicines  Take over-the-counter and prescription medicines only as told by your doctor.  Do not drive or use heavy machinery while taking prescription pain medicine. Managing pain  If directed, put ice on the affected area. ? Put ice in a plastic bag. ? Place a towel between your skin and the bag. ? Leave the ice on for 20 minutes, 2-3 times a day.  After icing, apply heat to the affected area before you exercise or as often as told by your doctor. Use the heat source that your doctor tells you to use, such as a moist heat pack or a heating pad. ? Place a towel between your skin and the heat source. ? Leave the heat on for 20-30 minutes. ? Remove the heat if your skin turns bright red. This is especially important if you are unable to feel pain, heat, or cold. You may have a greater risk of getting burned. Activity  Return to your normal activities as told by your doctor. Ask your doctor what activities are safe for you. ? Avoid activities that make your sciatica worse.  Take short rests during the day. Rest in a lying or standing position. This is usually better than sitting to rest. ? When you rest for a long time, do some physical activity or stretching between periods of rest. ? Avoid sitting for a long time without moving. Get up and move around at least one time each hour.  Exercise and stretch regularly, as told by your doctor.  Do not lift anything that is heavier than 10 lb (4.5 kg) while you have symptoms of sciatica. ? Avoid lifting heavy things even when you do not have symptoms. ? Avoid lifting heavy  things over and over.  When you lift objects, always lift in a way that is safe for your body. To do this, you should: ? Bend your knees. ? Keep the object close to your body. ? Avoid twisting. General instructions  Use good posture. ? Avoid leaning forward when you are sitting. ? Avoid hunching over when you are standing.  Stay at a healthy weight.  Wear comfortable shoes that support your feet. Avoid wearing high heels.  Avoid sleeping on a mattress that is too soft or too hard. You might have less pain if you sleep on a mattress that is firm enough to support your back.  Keep all follow-up visits as told by your doctor. This is important. Contact a doctor if:  You have pain that: ? Wakes you up when you are sleeping. ? Gets worse when you lie down. ? Is worse than the pain you have had in the past. ? Lasts longer than 4 weeks.  You lose weight for without trying. Get help right away if:  You cannot control when you pee (urinate) or poop (have a bowel movement).  You have weakness in any of these areas and it gets worse. ? Lower back. ? Lower belly (pelvis). ? Butt (buttocks). ? Legs.  You have redness or swelling of your back.  You have a burning feeling when you pee. This information is not intended to replace   advice given to you by your health care provider. Make sure you discuss any questions you have with your health care provider. Document Released: 01/08/2008 Document Revised: 09/06/2015 Document Reviewed: 12/08/2014 Elsevier Interactive Patient Education  2018 Elsevier Inc.  

## 2017-09-22 DIAGNOSIS — E782 Mixed hyperlipidemia: Secondary | ICD-10-CM

## 2017-09-23 ENCOUNTER — Encounter (HOSPITAL_COMMUNITY): Payer: Self-pay | Admitting: Psychiatry

## 2017-09-23 NOTE — Telephone Encounter (Signed)
Hey can you help [place] with this.  Let me know if I can help as well or if we need to send something over

## 2017-09-24 ENCOUNTER — Encounter: Payer: Self-pay | Admitting: Physical Medicine & Rehabilitation

## 2017-09-24 ENCOUNTER — Ambulatory Visit: Payer: PPO | Admitting: Physical Medicine & Rehabilitation

## 2017-09-24 VITALS — BP 129/88 | HR 82 | Ht 68.0 in | Wt 178.0 lb

## 2017-09-24 DIAGNOSIS — M25551 Pain in right hip: Secondary | ICD-10-CM | POA: Diagnosis not present

## 2017-09-24 DIAGNOSIS — M5417 Radiculopathy, lumbosacral region: Secondary | ICD-10-CM

## 2017-09-24 DIAGNOSIS — M545 Low back pain: Secondary | ICD-10-CM | POA: Diagnosis not present

## 2017-09-24 DIAGNOSIS — G8929 Other chronic pain: Secondary | ICD-10-CM | POA: Diagnosis not present

## 2017-09-24 DIAGNOSIS — M5117 Intervertebral disc disorders with radiculopathy, lumbosacral region: Secondary | ICD-10-CM | POA: Diagnosis not present

## 2017-09-24 MED ORDER — DIAZEPAM 10 MG PO TABS: 10.0000 mg | ORAL_TABLET | Freq: Four times a day (QID) | ORAL | 0 refills | Status: DC | PRN

## 2017-09-24 NOTE — Patient Instructions (Signed)
Inyección epidural de corticoides  Epidural Steroid Injection  Una inyección epidural de corticoides es una inyección de un corticoide y un medicamento anestésico que se aplica en el espacio entre la médula espinal y los huesos de la espalda (espacio epidural). La inyección ayuda a aliviar el dolor causado por la irritación o la inflamación de una raíz nerviosa.  El grado de alivio del dolor que brinda la inyección depende de qué causa la inflamación y la irritación del nervio, y de la duración del dolor. Es más probable que esta inyección le aporte un beneficio si el dolor que padece es fuerte y aparece de repente, más que si lo ha tenido durante mucho tiempo.  Informe al médico acerca de lo siguiente:  · Cualquier alergia que tenga.  · Todos los medicamentos que utiliza, incluidos vitaminas, hierbas, gotas oftálmicas, cremas y medicamentos de venta libre.  · Cualquier problema previo que usted o los miembros de su familia hayan tenido con anestésicos.  · Enfermedades de la sangre que tenga.  · Cirugías previas.  · Cualquier enfermedad que tenga.  · Si está embarazada o podría estarlo.  ¿Cuáles son los riesgos?  En general, se trata de un procedimiento seguro. Sin embargo, pueden ocurrir complicaciones, por ejemplo:  · Dolor de cabeza.  · Hemorragia.  · Una infección.  · Reacción alérgica a un medicamento.  · Daño a los nervios.    ¿Qué ocurre antes del procedimiento?  Mantenerse hidratado  Siga las indicaciones del médico acerca de la hidratación, las cuales pueden incluir lo siguiente:  · Hasta 2 horas antes del procedimiento, puede beber líquidos transparentes, como agua, jugos frutales transparentes, café negro y té solo.    Restricciones en las comidas y bebidas  Siga las indicaciones del médico respecto de las comidas y bebidas, las cuales pueden incluir lo siguiente:  · Ocho horas antes del procedimiento, deje de ingerir comidas o alimentos pesados, por ejemplo, carne, alimentos fritos o alimentos  grasos.  · Seis horas antes del procedimiento, deje de ingerir comidas o alimentos livianos, como tostadas o cereales.  · Seis horas antes del procedimiento, deje de beber leche o bebidas que contengan leche.  · Dos horas antes del procedimiento, deje de beber líquidos transparentes.    Medicamentos  · Pueden darle medicamentos para calmar la ansiedad.  · Consulte al médico si debe hacer o no lo siguiente:  ? Cambiar o suspender los medicamentos que toma habitualmente. Esto es muy importante si toma medicamentos para la diabetes o anticoagulantes.  ? Tomar medicamentos como aspirina e ibuprofeno. Estos medicamentos pueden tener un efecto anticoagulante en la sangre. No tome estos medicamentos antes del procedimiento si el médico le indica que no lo haga.  Instrucciones generales  · Haga planes para que una persona lo lleve a su casa desde el hospital o la clínica.  ¿Qué ocurre durante el procedimiento?  · Pueden darle un medicamento para que se relaje (sedante).  · Le pedirán que se recueste boca abajo.  · Higienizarán el sitio de la inyección.  · Para adormecer el lugar de la inyección, le aplicarán un medicamento anestésico (anestesia local).  · Le introducirán una aguja a través de la piel hasta el espacio epidural. Puede sentir algunas molestias cuando esto ocurre. Se usará un equipo de rayos X para asegurarse de que la aguja se coloque lo más cerca posible del nervio afectado.  · Le inyectarán un corticoide y anestesia local en el espacio epidural.  · Se retira la aguja . .  ·   Le colocarán una venda (vendaje) sobre el lugar de la inyección.  ¿Qué sucede después del procedimiento?  · Le controlarán la presión arterial, la frecuencia cardíaca, la frecuencia respiratoria y el nivel de oxígeno en la sangre hasta que haya desaparecido el efecto de los medicamentos administrados.  · Puede sentir debilidad o adormecimiento en el brazo o la pierna durante algunas horas.  · Puede sentir dolor en el lugar de la  inyección.  · No conduzca durante 24 horas si le administraron un sedante.  Esta información no tiene como fin reemplazar el consejo del médico. Asegúrese de hacerle al médico cualquier pregunta que tenga.  Document Released: 12/01/2012 Document Revised: 07/04/2016 Document Reviewed: 07/17/2015  Elsevier Interactive Patient Education © 2018 Elsevier Inc.

## 2017-09-24 NOTE — Progress Notes (Signed)
Subjective:    Patient ID: Joshua [name], male    DOB: 04-18-1970, 47 y.o.   MRN: [id number]  HPI 47 year old [place] male accompanied by his wife and Spanish interpreter who is here for the evaluation of chronic low back pain. His pain started around 8 years ago.  He had some type of injections performed [place]  He was asked to curl up in a fetal position he was given some sedation and received some type of back injection.  We discussed this sounded like it may have been an OB style blind epidural injection.  He did not remember getting an injection using an x-ray machine on his low back area.  He did have some type of image guided cervical spine injection in the past.  He does have some cervical spine pain as well but this is not his major complaint.  Low back pain and right hip/groin pain does increase with ambulation.  He has tried physical therapy in 2018 and had approximately a dozen visits without significant improvements.  In addition he has tried medication management including nonsteroidal anti-inflammatories and narcotic analgesics without significant improvement.  He has not undergone surgical evaluation for this problem in the past.  Patient has a history of anxiety and depression and is followed by behavioral health.  He is on Paxil for anxiety as well as trazodone for sleep, in addition he is on Abilify and nortriptyline Of the past medical history significant for obstructive sleep apnea Pain Inventory Average Pain 8 Pain Right Now 9 My pain is constant, sharp, stabbing and tingling  In the last 24 hours, has pain interfered with the following? General activity 8 Relation with others 5 Enjoyment of life 6 What TIME of day is your pain at its worst? all Sleep (in general) Fair  Pain is worse with: walking, bending, sitting, standing and some activites Pain improves with: rest and medication Relief from Meds: 3  Mobility walk with assistance use a  cane use a walker do you drive?  yes  Function disabled: date disabled 2013 retired I need assistance with the following:  dressing, bathing, household duties and shopping  Neuro/Psych numbness tingling trouble walking spasms depression anxiety  Prior Studies Any changes since last visit?  no MRI LUMBAR SPINE WITHOUT CONTRAST  TECHNIQUE: Multiplanar, multisequence MR imaging of the lumbar spine was performed. No intravenous contrast was administered.  COMPARISON:  None.  FINDINGS: Segmentation:  Standard.  Alignment:  Physiologic.  Vertebrae:  No fracture, evidence of discitis, or bone lesion.  Conus medullaris: Extends to the T12 level and appears normal.  Paraspinal and other soft tissues: No paraspinal abnormality.  Disc levels:  Disc spaces: Mild disc desiccation at L1-2 and L5-S1.  T12-L1: No significant disc bulge. No evidence of neural foraminal stenosis. No central canal stenosis.  L1-L2: Minimal broad-based disc bulge with a posterior annular fissure. No evidence of neural foraminal stenosis. No central canal stenosis.  L2-L3: No significant disc bulge. No evidence of neural foraminal stenosis. No central canal stenosis.  L3-L4: No significant disc bulge. No evidence of neural foraminal stenosis. No central canal stenosis.  L4-L5: No significant disc bulge. No evidence of neural foraminal stenosis. No central canal stenosis.  L5-S1: Small right paracentral disc protrusion contacting the right intraspinal S1 nerve root. Mild bilateral facet arthropathy. No evidence of neural foraminal stenosis. No central canal stenosis.  IMPRESSION: 1. At L5-S1 there is a small right paracentral disc protrusion contacting the right intraspinal S1 nerve  root. Mild bilateral facet arthropathy.   Electronically Signed   By: Elige Ko   On: 09/06/2016 16:09 Physicians involved in your care Any changes since last visit?  no   Family  History  Problem Relation Age of Onset  . Hypertension Paternal Uncle   . Diabetes Paternal Uncle    Social History   Socioeconomic History  . Marital status: Married    Spouse name: Not on file  . Number of children: Not on file  . Years of education: Not on file  . Highest education level: Not on file  Occupational History  . Not on file  Social Needs  . Financial resource strain: Not on file  . Food insecurity:    Worry: Not on file    Inability: Not on file  . Transportation needs:    Medical: Not on file    Non-medical: Not on file  Tobacco Use  . Smoking status: Never Smoker  . Smokeless tobacco: Never Used  Substance and Sexual Activity  . Alcohol use: No  . Drug use: No  . Sexual activity: Yes    Birth control/protection: None  Lifestyle  . Physical activity:    Days per week: Not on file    Minutes per session: Not on file  . Stress: Not on file  Relationships  . Social connections:    Talks on phone: Not on file    Gets together: Not on file    Attends religious service: Not on file    Active member of club or organization: Not on file    Attends meetings of clubs or organizations: Not on file    Relationship status: Not on file  Other Topics Concern  . Not on file  Social History Narrative  . Not on file   History reviewed. No pertinent surgical history. Past Medical History:  Diagnosis Date  . Anxiety   . Back pain with right-sided radiculopathy   . Depression   . High cholesterol    BP 129/88   Pulse 82   Ht 5\' 8"  (1.727 m)   Wt 178 lb (80.7 kg)   SpO2 97%   BMI 27.06 kg/m   Opioid Risk Score:   Fall Risk Score:  `1  Depression screen PHQ 2/9  Depression screen [facility] 2/9 09/21/2017 07/29/2017 05/01/2017 09/12/2016 07/18/2016 06/06/2016  Decreased Interest 2 2 2 1 2 3   Down, Depressed, Hopeless 2 3 2 2 3 3   PHQ - 2 Score 4 5 4 3 5 6   Altered sleeping 2 2 3 3 2 3   Tired, decreased energy 2 3 3 3 1 2   Change in appetite 2 3 2 2 2 1   Feeling  bad or failure about yourself  2 3 2 1 1 3   Trouble concentrating 2 1 2 1 1 2   Moving slowly or fidgety/restless 3 3 3 3 1 2   Suicidal thoughts 0 2 1 1  - 2  PHQ-9 Score 17 22 20 17 13 21      Review of Systems  Constitutional: Negative.   HENT: Negative.   Eyes: Negative.   Respiratory: Positive for apnea.   Cardiovascular: Negative.   Gastrointestinal: Negative.   Endocrine: Negative.   Genitourinary: Negative.   Musculoskeletal: Positive for arthralgias, back pain, gait problem and myalgias.  Skin: Negative.   Allergic/Immunologic: Negative.   Neurological: Positive for numbness.  Hematological: Negative.   Psychiatric/Behavioral: Positive for dysphoric mood. The patient is nervous/anxious.   All other systems reviewed and  are negative.      Objective:   Physical Exam  Constitutional: He is oriented to person, place, and time. He appears well-developed and well-nourished. No distress.  HENT:  Head: Normocephalic and atraumatic.  Eyes: Pupils are equal, round, and reactive to light. EOM are normal.  Neck: Normal range of motion.  Neurological: He is alert and oriented to person, place, and time.  Skin: Skin is warm and dry. He is not diaphoretic.  Psychiatric:  Patient has some exaggerated pain behaviors such as wincing with even small changes in positioning or with very light palpation of the skin over the low back area  Nursing note and vitals reviewed. Motor strength is 5/5 bilateral deltoid bicep tricep grip as well as hip flexor knee extensor ankle dorsiflexor Positive straight leg raise in the right lower limb Sensation intact to sharp touch bilateral L2-L3- dermatomal distribution, reduced at the right L4 L5-S1 dermatomal distribution Lumbar spine range of motion reduced with flexion extension lateral bending rotation accompanied by pain. Patient has reduced internal and external rotation of bilateral hips.  He complains of right groin pain with Faber's maneuver on  the right side.        Assessment & Plan:  1.  Chronic low back pain this is been going on for approximately 8 years he has had some mild gradual worsening with time but no changes in character or new accompanying symptoms.  He has no associated red flags and I do not think repeat imaging is needed at this time. We discussed treatment options, he is already failed medication management as well as physical therapy.  He is tried what sounds like a blind epidural injections which have a high rate of failure.  I think is reasonable to do fluoroscopic guided L5-S1 transforaminal injection.  We discussed that he may need up to 3 injections.  If he fails this therapy I would recommend referral to orthopedic surgery to evaluate for laminectomy/discectomy  2.  Chronic hip pain he does have limitation with range of motion I suspect he may have some underlying osteoarthritis.  This is not his major concern at the current time but I would recommend x-ray of the right hip and pelvis in the future.  No history of trauma to this area.  I discussed my findings and recommendations with patient through his interpreter.  His wife is in attendance.

## 2017-10-12 DIAGNOSIS — G4733 Obstructive sleep apnea (adult) (pediatric): Secondary | ICD-10-CM | POA: Diagnosis not present

## 2017-10-16 ENCOUNTER — Ambulatory Visit (HOSPITAL_BASED_OUTPATIENT_CLINIC_OR_DEPARTMENT_OTHER): Payer: PPO | Admitting: Physical Medicine & Rehabilitation

## 2017-10-16 ENCOUNTER — Encounter: Payer: PPO | Attending: Physical Medicine & Rehabilitation

## 2017-10-16 VITALS — BP 121/85 | HR 76 | Wt 174.6 lb

## 2017-10-16 DIAGNOSIS — M25551 Pain in right hip: Secondary | ICD-10-CM | POA: Diagnosis not present

## 2017-10-16 DIAGNOSIS — M5117 Intervertebral disc disorders with radiculopathy, lumbosacral region: Secondary | ICD-10-CM | POA: Insufficient documentation

## 2017-10-16 DIAGNOSIS — M545 Low back pain: Secondary | ICD-10-CM | POA: Diagnosis not present

## 2017-10-16 DIAGNOSIS — G8929 Other chronic pain: Secondary | ICD-10-CM | POA: Insufficient documentation

## 2017-10-16 DIAGNOSIS — M5417 Radiculopathy, lumbosacral region: Secondary | ICD-10-CM

## 2017-10-16 NOTE — Progress Notes (Signed)
Right L5-S1 Lumbar transforaminal epidural steroid injection under fluoroscopic guidance  Indication: Lumbosacral radiculitis is not relieved by medication management or other conservative care and interfering with self-care and mobility.   Informed consent was obtained after describing risk and benefits of the procedure with the patient, this includes bleeding, bruising, infection, paralysis and medication side effects.  The patient wishes to proceed and has given written consent.  Patient was placed in prone position.  The lumbar area was marked and prepped with Betadine.  It was entered with a 25-gauge 1-1/2 inch needle and one mL of 1% lidocaine was injected into the skin and subcutaneous tissue.  Then a 22-gauge 3.5in  spinal needle was inserted into the Right L5-S1  intervertebral foramen under AP, lateral, and oblique view.  Then a solution containing one mL of 10 mg per mL dexamethasone and 2 mL of 1% lidocaine was injected.  The patient tolerated procedure well.  Post procedure instructions were given.  Please see post procedure form. 

## 2017-10-16 NOTE — Progress Notes (Signed)
  PROCEDURE RECORD [facility]   Name: [name] DOB:03-19-1971 MRN: [id number]  Date:10/16/2017  Physician: Claudette LawsAndrew Kirsteins, MD    Nurse/CMA: Wessling,CMA  Allergies:  Allergies  Allergen Reactions  . Flexeril [Cyclobenzaprine]     Pt turns very aggressive    Consent Signed: Yes.    Is patient diabetic? No.  CBG today?   Pregnant: No. LMP: No LMP for male patient. (age 46-55)  Anticoagulants: no Anti-inflammatory: no Antibiotics: no  Procedure: Transforaminal Epidural Injection Position: Prone Start Time:1:55pm  End Time: 2:00pm Fluoro Time:24  RN/CMA Milianna Ericsson,CMA Wessling,CMA    Time 1:33pm 2:06pm    BP 121/85 125/85    Pulse 76 70    Respirations 16 16    O2 Sat 95 97    S/S 6 95    Pain Level 6/10 8/10     D/C home with wife patient A & O X 3, D/C instructions reviewed, and sits independently.

## 2017-10-16 NOTE — Patient Instructions (Signed)
Inyeccin epidural con corticoesteroides, cuidados posteriores Epidural Steroid Injection, Care After Siga estas instrucciones durante las prximas semanas. Estas indicaciones le proporcionan informacin acerca de cmo deber cuidarse despus del procedimiento. El mdico tambin podr darle instrucciones ms especficas. El tratamiento ha sido planificado segn las prcticas mdicas actuales, pero en algunos casos pueden ocurrir problemas. Comunquese con el mdico si tiene algn problema o dudas despus del procedimiento. Qu puedo esperar despus del procedimiento? Despus del procedimiento, es normal sentir molestias leves en el lugar de la inyeccin. Siga estas indicaciones en su casa:  Durante 24horas despus del procedimiento: ? Evite el calor en el lugar de la inyeccin. ? No tome baos de inmersin y no se sumerja en agua. ? No conduzca si le administraron un medicamento para ayudarlo a que se relaje (sedante).  Si se lo indican, aplique hielo en el lugar de la inyeccin: ? Ponga el hielo en una bolsa plstica. ? Coloque una FirstEnergy Corptoalla entre la piel y la bolsa de hielo. ? Coloque el hielo durante 20minutos, 2 a 3veces por da.  Retome sus actividades normales como se lo haya indicado el mdico. Pregntele al mdico qu actividades son seguras para usted.  Puede retirarse las vendas (vendaje) despus de 24horas.  Tome los medicamentos de venta libre y los recetados solamente como se lo haya indicado el mdico.  OceanographerConcurra a todas las visitas de control como se lo haya indicado el mdico. Esto es importante. Comunquese con un mdico si:  Tiene fiebre.  Contina sintiendo Art therapistdolor en el lugar de la inyeccin, incluso despus de tomar un analgsico de Sales promotion account executiveventa libre.  Tiene nuseas o vmitos intensos, repentinos o prolongados. Solicite ayuda de inmediato si:  Geographical information systems officeriente dolor intenso en el lugar de la inyeccin, que no se alivia con medicamentos.  Presenta un fuerte dolor de cabeza o  rigidez en el cuello.  Tiene sensibilidad a Statisticianla luz.  Siente una nueva forma de debilidad o adormecimiento en los brazos o en las piernas.  Comienza a perder el control de la vejiga o los movimientos intestinales.  Tiene dificultad para respirar. Esta informacin no tiene Theme park managercomo fin reemplazar el consejo del mdico. Asegrese de hacerle al mdico cualquier pregunta que tenga. Document Released: 12/01/2012 Document Revised: 07/04/2016 Document Reviewed: 07/17/2015 Elsevier Interactive Patient Education  [employer]2018 Elsevier Inc.

## 2017-11-02 ENCOUNTER — Ambulatory Visit: Payer: Self-pay | Admitting: Nurse Practitioner

## 2017-11-12 DIAGNOSIS — G4733 Obstructive sleep apnea (adult) (pediatric): Secondary | ICD-10-CM | POA: Diagnosis not present

## 2017-11-16 ENCOUNTER — Ambulatory Visit (HOSPITAL_COMMUNITY): Payer: PPO | Admitting: Psychiatry

## 2017-11-16 ENCOUNTER — Encounter (HOSPITAL_COMMUNITY): Payer: Self-pay | Admitting: Psychiatry

## 2017-11-16 VITALS — BP 128/83 | HR 70 | Ht 68.0 in | Wt 176.0 lb

## 2017-11-16 DIAGNOSIS — Z8659 Personal history of other mental and behavioral disorders: Secondary | ICD-10-CM

## 2017-11-16 DIAGNOSIS — F333 Major depressive disorder, recurrent, severe with psychotic symptoms: Secondary | ICD-10-CM

## 2017-11-16 DIAGNOSIS — G4730 Sleep apnea, unspecified: Secondary | ICD-10-CM | POA: Diagnosis not present

## 2017-11-16 MED ORDER — TRAZODONE HCL 50 MG PO TABS: 50.0000 mg | ORAL_TABLET | Freq: Every day | ORAL | 0 refills | Status: DC

## 2017-11-16 MED ORDER — PAROXETINE HCL 40 MG PO TABS: 40.0000 mg | ORAL_TABLET | Freq: Every day | ORAL | 0 refills | Status: DC

## 2017-11-16 NOTE — Progress Notes (Signed)
BH MD/PA/NP OP Progress Note  11/16/2017 1:29 PM Joshua [name]  MRN:  [id number]  Chief Complaint:  med management  HPI: Joshua [name] presents with general good control of his symptoms, and reports that the CPAP is making a difference along with the trazodone and increase in Paxil.  He reports that his pain has been much better also, and he has been able to ambulate without using his cane all of the time.  He feels comfortable with the current regimen and understands this will be our final visit, as Clinical research associate is leaving in 3-4 weeks.  He is agreeable to transfer care to Dr. Lolly Mustache for follow-up.  Denies any acute safety concerns or suicidality, and his wife is present and able to corroborate his progress.  Translator services were used for the duration of this visit  Visit Diagnosis:    ICD-10-CM   1. 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 Sleep-disordered breathing G47.30   3. History of major depression Z86.59 PARoxetine (PAXIL) 40 MG tablet    traZODone (DESYREL) 50 MG tablet    Past Psychiatric History: See intake H&P for full details. Reviewed, with no updates at this time.  Past Medical History:  Past Medical History:  Diagnosis Date  . Anxiety   . Back pain with right-sided radiculopathy   . Depression   . High cholesterol    History reviewed. No pertinent surgical history.  Family Psychiatric History: See intake H&P for full details. Reviewed, with no updates at this time.   Family History:  Family History  Problem Relation Age of Onset  . Hypertension Paternal Uncle   . Diabetes Paternal Uncle     Social History:  Social History   Socioeconomic History  . Marital status: Married    Spouse name: Not on file  . Number of children: Not on file  . Years of education: Not on file  . Highest education level: Not on file  Occupational History  . Not on file  Social Needs  .  Financial resource strain: Not on file  . Food insecurity:    Worry: Not on file    Inability: Not on file  . Transportation needs:    Medical: Not on file    Non-medical: Not on file  Tobacco Use  . Smoking status: Never Smoker  . Smokeless tobacco: Never Used  Substance and Sexual Activity  . Alcohol use: No  . Drug use: No  . Sexual activity: Yes    Birth control/protection: None  Lifestyle  . Physical activity:    Days per week: Not on file    Minutes per session: Not on file  . Stress: Not on file  Relationships  . Social connections:    Talks on phone: Not on file    Gets together: Not on file    Attends religious service: Not on file    Active member of club or organization: Not on file    Attends meetings of clubs or organizations: Not on file    Relationship status: Not on file  Other Topics Concern  . Not on file  Social History Narrative  . Not on file    Allergies:  Allergies  Allergen Reactions  . Flexeril [Cyclobenzaprine]     Pt turns very aggressive    Metabolic Disorder Labs: No results found for: HGBA1C, MPG No results found for: PROLACTIN Lab Results  Component Value Date   CHOL  205 (H) 05/01/2017   TRIG 223 (H) 05/01/2017   HDL 34 (L) 05/01/2017   CHOLHDL 6.0 (H) 05/01/2017   LDLCALC 126 (H) 05/01/2017   LDLCALC 142 (H) 09/15/2016   No results found for: TSH  Therapeutic Level Labs: No results found for: LITHIUM No results found for: VALPROATE No components found for:  CBMZ  Current Medications: Current Outpatient Medications  Medication Sig Dispense Refill  . diazepam (VALIUM) 10 MG tablet Take 1 tablet (10 mg total) by mouth every 6 (six) hours as needed for anxiety. 1 tablet 0  . methocarbamol (ROBAXIN) 500 MG tablet Take 2 tablets (1,000 mg total) by mouth every 8 (eight) hours as needed for muscle spasms. 90 tablet 1  . naproxen (NAPROSYN) 500 MG tablet Take 1 tablet (500 mg total) by mouth 2 (two) times daily with a meal. 60  tablet 1  . nortriptyline (PAMELOR) 50 MG capsule Take 1 capsule (50 mg total) by mouth at bedtime. 90 capsule 1  . omeprazole (PRILOSEC) 20 MG capsule Take 1 capsule (20 mg total) by mouth daily. 30 capsule 1  . PARoxetine (PAXIL) 40 MG tablet Take 1 tablet (40 mg total) by mouth daily. 90 tablet 0  . traZODone (DESYREL) 50 MG tablet Take 1 tablet (50 mg total) by mouth at bedtime. 90 tablet 0  . Acetaminophen-Codeine (TYLENOL/CODEINE #3) 300-30 MG tablet Take 1-2 tablets by mouth every 6 (six) hours as needed for pain. 40 tablet 0  . ARIPiprazole (ABILIFY) 2 MG tablet Take 1 tablet (2 mg total) by mouth daily. 90 tablet 1  . aspirin 81 MG tablet Take 1 tablet (81 mg total) by mouth daily. 90 tablet 3  . atorvastatin (LIPITOR) 20 MG tablet Take 1 tablet (20 mg total) by mouth daily. 90 tablet 1  . fluticasone (FLONASE) 50 MCG/ACT nasal spray Place into the nose.     No current facility-administered medications for this visit.      Musculoskeletal: Strength & Muscle Tone: within normal limits Gait & Station: normal Patient leans: N/A  Psychiatric Specialty Exam: ROS  Blood pressure 128/83, pulse 70, height 5\' 8"  (1.727 m), weight 176 lb (79.8 kg), SpO2 98 %.Body mass index is 26.76 kg/m.  General Appearance: Casual and Well Groomed  Eye Contact:  Good  Speech:  Clear and Coherent  Volume:  Normal  Mood:  Anxious  Affect:  Congruent  Thought Process:  Goal Directed and Descriptions of Associations: Intact  Orientation:  Full (Time, Place, and Person)  Thought Content: Logical   Suicidal Thoughts:  No  Homicidal Thoughts:  No  Memory:  Immediate;   Fair  Judgement:  Fair  Insight:  Good  Psychomotor Activity:  Normal  Concentration:  Concentration: Good  Recall:  Good  Fund of Knowledge: Good  Language: Good  Akathisia:  Negative  Handed:  Right  AIMS (if indicated): not done  Assets:  Communication Skills Desire for Improvement Financial Resources/Insurance Housing   ADL's:  Intact  Cognition: WNL  Sleep:  Poor   Screenings: GAD-7     [facility]  Total GAD-7 Score  20  18  15  15  17     PHQ2-9  [facility] Office Visit from 07/29/2017 in [facility] Office Visit from 05/01/2017 in [facility] Office Visit from 09/12/2016 in [facility]  PHQ-2 Total Score  4  4  5  4  3   PHQ-9 Total Score  19  17  22  20  17        Assessment and Plan:  Basilio Meadow has substantial improvement in his depressive symptoms, continues to work on staying active throughout the day, although this is limited by his chronic pain from prior injuries.  He feels like he is sleeping well, and his outlook on life is more positive.  No acute safety issues, no substance abuse.  He is tolerating the medication regimen as below without any side effects to report at this time.  He is using his CPAP every night consistently, and seems to be receiving benefit.  He feels like his energy is better and he feels more clearheaded throughout the day.  1. Severe episode of recurrent major depressive disorder, with psychotic features (HCC)   2. Sleep-disordered breathing   3. History of major depression     Status of current problems: gradually improving  Labs Ordered: No orders of the defined types were placed in this encounter.   Labs Reviewed: na  Collateral Obtained/Records Reviewed: wife is present, translator present to  ensure good communication  Plan:  Continue Pamelor 50 mg nightly Continue Abilify 2 mg nightly Continue Paxil to 40 mg nightly Continue trazodone 50 mg nightly CPAP used every day with good results Dr. Lolly Mustache in 3 months   Burnard Leigh, MD 11/16/2017, 1:29 PM

## 2017-11-20 ENCOUNTER — Ambulatory Visit: Payer: PPO | Attending: Nurse Practitioner | Admitting: Nurse Practitioner

## 2017-11-20 ENCOUNTER — Encounter: Payer: Self-pay | Admitting: Nurse Practitioner

## 2017-11-20 VITALS — BP 133/87 | HR 74 | Temp 98.2°F | Ht 68.0 in | Wt 175.2 lb

## 2017-11-20 DIAGNOSIS — G8929 Other chronic pain: Secondary | ICD-10-CM | POA: Insufficient documentation

## 2017-11-20 DIAGNOSIS — M5441 Lumbago with sciatica, right side: Secondary | ICD-10-CM | POA: Diagnosis not present

## 2017-11-20 DIAGNOSIS — Z791 Long term (current) use of non-steroidal anti-inflammatories (NSAID): Secondary | ICD-10-CM | POA: Diagnosis not present

## 2017-11-20 DIAGNOSIS — F329 Major depressive disorder, single episode, unspecified: Secondary | ICD-10-CM | POA: Insufficient documentation

## 2017-11-20 DIAGNOSIS — E785 Hyperlipidemia, unspecified: Secondary | ICD-10-CM | POA: Insufficient documentation

## 2017-11-20 DIAGNOSIS — F419 Anxiety disorder, unspecified: Secondary | ICD-10-CM | POA: Diagnosis not present

## 2017-11-20 DIAGNOSIS — Z029 Encounter for administrative examinations, unspecified: Secondary | ICD-10-CM | POA: Diagnosis not present

## 2017-11-20 DIAGNOSIS — Z8249 Family history of ischemic heart disease and other diseases of the circulatory system: Secondary | ICD-10-CM | POA: Insufficient documentation

## 2017-11-20 DIAGNOSIS — Z5189 Encounter for other specified aftercare: Secondary | ICD-10-CM | POA: Diagnosis not present

## 2017-11-20 DIAGNOSIS — Z79899 Other long term (current) drug therapy: Secondary | ICD-10-CM | POA: Diagnosis not present

## 2017-11-20 DIAGNOSIS — Z7982 Long term (current) use of aspirin: Secondary | ICD-10-CM | POA: Insufficient documentation

## 2017-11-20 DIAGNOSIS — E782 Mixed hyperlipidemia: Secondary | ICD-10-CM | POA: Diagnosis not present

## 2017-11-20 MED ORDER — ACETAMINOPHEN-CODEINE 300-30 MG PO TABS: 1.0000 | ORAL_TABLET | Freq: Three times a day (TID) | ORAL | 0 refills | Status: DC | PRN

## 2017-11-20 NOTE — Progress Notes (Addendum)
Assessment & Plan:  Joshua [name] was seen today for follow-up.  Diagnoses and all orders for this visit:  Chronic bilateral low back pain with right-sided sciatica -     Acetaminophen-Codeine (TYLENOL/CODEINE #3) 300-30 MG tablet; Take 1-2 tablets by mouth every 8 (eight) hours as needed for pain. Focus on weight reduction to help reduce back pain. May alternate with heat and ice application for pain relief. May also alternate with Ibuprofen as prescribed for back pain. Other alternatives include massage, acupuncture and water aerobics.  You must stay active and avoid a sedentary lifestyle.   Mixed hyperlipidemia -     Lipid panel INSTRUCTIONS: Work on a low fat, heart healthy diet and participate in regular aerobic exercise program by working out at least 150 minutes per week; 5 days a week-30 minutes per day. Avoid red meat, fried foods. junk foods, sodas, sugary drinks, unhealthy snacking, alcohol and smoking.  Drink at least 48oz of water per day and monitor your carbohydrate intake daily.    Encounter for narcotic contract discussion -     ToxASSURE Select 13 (MW), Urine Reviewed [place] Controlled Substance Reporting System. No unauthorized fills of controlled medications Last Tylenol #3 refill (10-09-2017)  Patient has been counseled on age-appropriate routine health concerns for screening and prevention. These are reviewed and up-to-date. Referrals have been placed accordingly. Immunizations are up-to-date or declined.    Subjective:   Chief Complaint  Patient presents with  . Follow-up    Pt. is here to follow-up on back pain.    HPI Joshua [name] 47 y.o. male presents to office today for follow up to chronic low back pain with right sciatica. VRI was used to communicate directly with patient for the entire encounter including providing detailed patient instructions.    Chronic Low Back Pain with Right Sciatica Onset over 8 years ago. Aggravating  factors: bending, twisting, walking, standing or sitting for prolonged periods of time. He is currently being treated with epidural steroid injections through [facility] Dr. Wynn Banker. Today he reports some improvement of his back pain however still with sciatica of RLE. He is requesting a refill of Tylenol # 3 today. Will obtain UDS and controlled substance agreement and refill as requested.   Depression/Anxiety He is seeing a psychiatrist. Denies any current suicidal or homicidal ideation.  Taking medications as prescribed.    Hyperlipidemia Patient presents for follow up to hyperlipidemia.  He is medication compliant and taking atorvastatin as prescribed. He is diet compliant and denies skin xanthelasma or statin intolerance including myalgias.  Lab Results  Component Value Date   CHOL 205 (H) 05/01/2017   Lab Results  Component Value Date   HDL 34 (L) 05/01/2017   Lab Results  Component Value Date   LDLCALC 126 (H) 05/01/2017   Lab Results  Component Value Date   TRIG 223 (H) 05/01/2017   Lab Results  Component Value Date   CHOLHDL 6.0 (H) 05/01/2017   Review of Systems  Constitutional: Negative for fever, malaise/fatigue and weight loss.  HENT: Negative.  Negative for nosebleeds.   Eyes: Negative.  Negative for blurred vision, double vision and photophobia.  Respiratory: Negative.  Negative for cough and shortness of breath.   Cardiovascular: Negative.  Negative for chest pain, palpitations and leg swelling.  Gastrointestinal: Negative.  Negative for heartburn, nausea and vomiting.  Musculoskeletal: Positive for back pain and myalgias.  Neurological: Positive for sensory change and weakness. Negative for dizziness, focal  weakness, seizures and headaches.  Psychiatric/Behavioral: Positive for depression. Negative for suicidal ideas. The patient is nervous/anxious and has insomnia.     Past Medical History:  Diagnosis Date  . Anxiety   . Back pain with right-sided  radiculopathy   . Depression   . High cholesterol     History reviewed. No pertinent surgical history.  Family History  Problem Relation Age of Onset  . Hypertension Paternal Uncle   . Diabetes Paternal Uncle     Social History Reviewed with no changes to be made today.   Outpatient Medications Prior to Visit  Medication Sig Dispense Refill  . ARIPiprazole (ABILIFY) 2 MG tablet Take 1 tablet (2 mg total) by mouth daily. 90 tablet 1  . aspirin 81 MG tablet Take 1 tablet (81 mg total) by mouth daily. 90 tablet 3  . atorvastatin (LIPITOR) 20 MG tablet Take 1 tablet (20 mg total) by mouth daily. 90 tablet 1  . methocarbamol (ROBAXIN) 500 MG tablet Take 2 tablets (1,000 mg total) by mouth every 8 (eight) hours as needed for muscle spasms. 90 tablet 1  . naproxen (NAPROSYN) 500 MG tablet Take 1 tablet (500 mg total) by mouth 2 (two) times daily with a meal. 60 tablet 1  . nortriptyline (PAMELOR) 50 MG capsule Take 1 capsule (50 mg total) by mouth at bedtime. 90 capsule 1  . omeprazole (PRILOSEC) 20 MG capsule Take 1 capsule (20 mg total) by mouth daily. 30 capsule 1  . PARoxetine (PAXIL) 40 MG tablet Take 1 tablet (40 mg total) by mouth daily. 90 tablet 0  . traZODone (DESYREL) 50 MG tablet Take 1 tablet (50 mg total) by mouth at bedtime. 90 tablet 0  . Acetaminophen-Codeine (TYLENOL/CODEINE #3) 300-30 MG tablet Take 1-2 tablets by mouth every 6 (six) hours as needed for pain. 40 tablet 0  . diazepam (VALIUM) 10 MG tablet Take 1 tablet (10 mg total) by mouth every 6 (six) hours as needed for anxiety. (Patient not taking: Reported on 11/20/2017) 1 tablet 0  . fluticasone (FLONASE) 50 MCG/ACT nasal spray Place into the nose.     No facility-administered medications prior to visit.     Allergies  Allergen Reactions  . Flexeril [Cyclobenzaprine]     Pt turns very aggressive       Objective:    BP 133/87 (BP Location: Left Arm, Patient Position: Sitting, Cuff Size: Normal)   Pulse 74    Temp 98.2 F (36.8 C) (Oral)   Ht 5\' 8"  (1.727 m)   Wt 175 lb 3.2 oz (79.5 kg)   SpO2 96%   BMI 26.64 kg/m  Wt Readings from Last 3 Encounters:  11/20/17 175 lb 3.2 oz (79.5 kg)  11/16/17 176 lb (79.8 kg)  10/16/17 174 lb 9.6 oz (79.2 kg)    Physical Exam  Constitutional: He is oriented to person, place, and time. He appears well-developed and well-nourished. He is cooperative.  HENT:  Head: Normocephalic and atraumatic.  Eyes: EOM are normal.  Neck: Normal range of motion.  Cardiovascular: Normal rate, regular rhythm and normal heart sounds. Exam reveals no gallop and no friction rub.  No murmur heard. Pulmonary/Chest: Effort normal and breath sounds normal. No tachypnea. No respiratory distress. He has no decreased breath sounds. He has no wheezes. He has no rhonchi. He has no rales. He exhibits no tenderness.  Abdominal: Bowel sounds are normal.  Musculoskeletal: Normal range of motion. He exhibits tenderness. He exhibits no edema or deformity.  Lumbar back: He exhibits tenderness and pain.       Back:  Neurological: He is alert and oriented to person, place, and time. Gait (using a cane ) abnormal. Coordination normal.  Skin: Skin is warm and dry.  Psychiatric: He has a normal mood and affect. His behavior is normal. Judgment and thought content normal.  Nursing note and vitals reviewed.      Patient has been counseled extensively about nutrition and exercise as well as the importance of adherence with medications and regular follow-up. The patient was given clear instructions to go to ER or return to medical center if symptoms don't improve, worsen or new problems develop. The patient verbalized understanding.   Follow-up: Return in about 3 months (around 02/20/2018) for back pain.   Claiborne RiggZelda W Tyde Lamison, FNP-BC [facility] [place], [place] [phone]   11/20/2017, 2:30 PM

## 2017-11-24 LAB — DRUG SCREEN 13 W/CONF, WB
Amphetamines, IA: NEGATIVE ng/mL
BARBITURATES, IA: NEGATIVE ug/mL
BENZODIAZEPINES, IA: NEGATIVE ng/mL
Cocaine/Metabolite, IA: NEGATIVE ng/mL
Fentanyl, IA: NEGATIVE ng/mL
Meperidine, IA: NEGATIVE ng/mL
Methadone, IA: NEGATIVE ng/mL
Opiates, IA: NEGATIVE ng/mL
Oxycodones, IA: NEGATIVE ng/mL
PHENCYCLIDINE, IA: NEGATIVE ng/mL
Propoxyphene, IA: NEGATIVE ng/mL
THC (MARIJUANA) MTB,IA: NEGATIVE ng/mL
Tramadol, IA: NEGATIVE ng/mL

## 2017-11-24 LAB — LIPID PANEL
CHOLESTEROL TOTAL: 193 mg/dL (ref 100–199)
Chol/HDL Ratio: 5.7 ratio — ABNORMAL HIGH (ref 0.0–5.0)
HDL: 34 mg/dL — ABNORMAL LOW (ref >39)
LDL CALC: 128 mg/dL — AB (ref 0–99)
TRIGLYCERIDES: 154 mg/dL — AB (ref 0–149)
VLDL CHOLESTEROL CAL: 31 mg/dL (ref 5–40)

## 2017-11-26 ENCOUNTER — Encounter: Payer: PPO | Attending: Physical Medicine & Rehabilitation

## 2017-11-26 ENCOUNTER — Encounter: Payer: Self-pay | Admitting: Physical Medicine & Rehabilitation

## 2017-11-26 ENCOUNTER — Ambulatory Visit: Payer: PPO | Admitting: Physical Medicine & Rehabilitation

## 2017-11-26 VITALS — BP 129/86 | HR 79 | Resp 14 | Ht 68.0 in | Wt 173.0 lb

## 2017-11-26 DIAGNOSIS — M545 Low back pain: Secondary | ICD-10-CM | POA: Insufficient documentation

## 2017-11-26 DIAGNOSIS — M25551 Pain in right hip: Secondary | ICD-10-CM | POA: Diagnosis not present

## 2017-11-26 DIAGNOSIS — M5117 Intervertebral disc disorders with radiculopathy, lumbosacral region: Secondary | ICD-10-CM | POA: Insufficient documentation

## 2017-11-26 DIAGNOSIS — G8929 Other chronic pain: Secondary | ICD-10-CM | POA: Diagnosis not present

## 2017-11-26 DIAGNOSIS — M5417 Radiculopathy, lumbosacral region: Secondary | ICD-10-CM

## 2017-11-26 NOTE — Patient Instructions (Signed)
Ejercicios para la espalda (Back Exercises) Si tiene dolor de espalda, haga estos ejercicios 2 o 3veces por da, o como se lo haya indicado el mdico. Cuando el dolor desaparezca, hgalos una vez por da, pero haga ms repeticiones de cada ejercicio. Si no le duele la espalda, haga estos ejercicios una vez por da o como se lo haya indicado el mdico. EJERCICIOS Rodilla al pecho Repita estos pasos 3 o 5veces seguidas con cada pierna: 1. Acustese boca arriba sobre una cama dura o sobre el suelo con las piernas extendidas. 2. Lleve una rodilla al pecho. 3. Mantenga la rodilla contra el pecho. Para lograrlo tmese la rodilla o el muslo. 4. Tire de la rodilla hasta sentir una elongacin suave en la parte baja de la espalda. 5. Mantenga la elongacin durante 10 a 30segundos. 6. Suelte y extienda la pierna lentamente. Inclinacin de la pelvis Repita estos pasos 5 o 10veces seguidas: 1. Acustese boca arriba sobre una cama dura o sobre el suelo con las piernas extendidas. 2. Flexione las rodillas de manera que apunten al techo. Los pies deben estar apoyados en el suelo. 3. Contraiga los msculos de la parte baja del vientre (abdomen) para empujar la zona lumbar contra el suelo. Este movimiento har que el cccix apunte hacia el techo, en lugar de apuntar hacia abajo en direccin a los pies o al suelo. 4. Mantenga esta posicin durante 5 a 10segundos mientras contrae suavemente los msculos y respira con normalidad. El perro y el gato Repita estos pasos hasta que la zona lumbar se curve con ms facilidad: 1. Apoye las palmas de las manos y las rodillas sobre una superficie firme. Las manos deben estar alineadas con los hombros y las rodillas con las caderas. Puede colocarse almohadillas debajo de las rodillas. 2. Deje caer la cabeza y lleve el cccix hacia abajo de modo que apunte en direccin al suelo para que la zona lumbar se arquee como el lomo de un gato asustado. 3. Mantenga esta posicin  durante 5segundos. 4. Lentamente, levante la cabeza y lleve el cccix hacia arriba de modo que apunte en direccin al techo para que la espalda se arquee (hunda) como el lomo de un perro contento. 5. Mantenga esta posicin durante 5segundos. Flexiones de brazos Repita estos pasos 5 o 10veces seguidas: 1. Acustese boca abajo en el suelo. 2. Ponga las manos cerca de la cabeza, separadas aproximadamente al ancho de los hombros. 3. Con la espalda relajada y las caderas apoyadas en el suelo, extienda lentamente los brazos para levantar la mitad superior del cuerpo y elevar los hombros. No use los msculos de la espalda. Para estar ms cmodo, puede cambiar la ubicacin de las manos. 4. Mantenga esta posicin durante 5segundos. 5. Lentamente vuelva a la posicin horizontal. Puentes Repita estos pasos 10veces seguidas: 1. Acustese boca arriba sobre una superficie firme. 2. Flexione las rodillas de manera que apunten al techo. Los pies deben estar apoyados en el suelo. 3. Contraiga los glteos y despegue las nalgas del suelo hasta que la cintura est casi a la altura de las rodillas. Si no siente el trabajo muscular en las nalgas y la parte posterior de los muslos, aleje los pies 1 o 2pulgadas (2,5 o 5centmetros) de las nalgas. 4. Mantenga esta posicin durante 3 a 5segundos. 5. Lentamente, vuelva a apoyar las nalgas en el suelo y relaje los glteos. Si este ejercicio le resulta muy fcil, intente realizarlo con los brazos cruzados sobre el pecho. Abdominales Repita estos pasos 5 o   10veces seguidas: 1. Acustese boca arriba sobre una cama dura o sobre el suelo con las piernas extendidas. 2. Flexione las rodillas de manera que apunten al techo. Los pies deben estar apoyados en el suelo. 3. Cruce los brazos sobre el pecho. 4. Baje levemente el mentn en direccin al pecho, pero no doble el cuello. 5. Contraiga los msculos del abdomen y con lentitud eleve el pecho lo suficiente como para  despegar levemente los omplatos del suelo. 6. Lentamente baje el pecho y la cabeza hasta el suelo. Elevaciones de espalda Repita estos pasos 5 o 10veces seguidas: 1. Acustese boca abajo con los brazos a los costados y apoye la frente en el suelo. 2. Contraiga los msculos de las piernas y los glteos. 3. Lentamente despegue el pecho del suelo mientras mantiene las caderas apoyadas en el suelo. Mantenga la nuca alineada con la curvatura de la espalda. Mire hacia el suelo mientras hace este ejercicio. 4. Mantenga esta posicin durante 3 a 5segundos. 5. Lentamente baje el pecho y el rostro hasta el suelo. SOLICITE AYUDA SI:  El dolor de espalda se vuelve mucho ms intenso cuando hace un ejercicio.  El dolor de espalda no se alivia 2horas despus de hacer los ejercicios. Si tiene alguno de estos problemas, deje de hacer los ejercicios. No vuelva a hacer los ejercicios a menos que el mdico lo autorice. SOLICITE AYUDA DE INMEDIATO SI:  Siente un dolor sbito y muy intenso en la espalda. Si esto ocurre, deje de hacer los ejercicios. No vuelva a hacer los ejercicios a menos que el mdico lo autorice. Esta informacin no tiene como fin reemplazar el consejo del mdico. Asegrese de hacerle al mdico cualquier pregunta que tenga. Document Released: 07/16/2010 Document Revised: 07/23/2015 Document Reviewed: 05/25/2014 Elsevier Interactive Patient Education  2018 [employer]vier Inc.  

## 2017-11-26 NOTE — Progress Notes (Signed)
Subjective:    Patient ID: Joshua [name], male    DOB: 07-16-1970, 47 y.o.   MRN: [id number]  HPI 47 year old [place] male is accompanied by his wife as well as Spanish interpreter.  He is following up after right L5-S1 transforaminal lumbar epidural steroid injection performed on 10/16/2017.  He has tried physical therapy 2018 does not visit without significant improvement. He has been ambulating with cane He states that now he feels like his pain is very well controlled and has gone from an 8 out of 10 to a 5 out of 10.  He is now wishing to ambulate without the cane.  We discussed that this is a possibility and that he can wean himself off the cane.  Pain Inventory Average Pain 8 Pain Right Now 5 My pain is intermittent, burning, stabbing, tingling and aching  In the last 24 hours, has pain interfered with the following? General activity 5 Relation with others 6 Enjoyment of life 6 What TIME of day is your pain at its worst? all Sleep (in general) Poor  Pain is worse with: walking, bending, sitting, standing and some activites Pain improves with: medication and injections Relief from Meds: 4  Mobility walk with assistance use a cane how many minutes can you walk? 8 ability to climb steps?  no do you drive?  yes  Function disabled: date disabled . I need assistance with the following:  dressing, bathing, household duties and shopping  Neuro/Psych weakness numbness tingling trouble walking spasms depression anxiety  Prior Studies Any changes since last visit?  no  Physicians involved in your care Any changes since last visit?  no   Family History  Problem Relation Age of Onset  . Hypertension Paternal Uncle   . Diabetes Paternal Uncle    Social History   Socioeconomic History  . Marital status: Married    Spouse name: Not on file  . Number of children: Not on file  . Years of education: Not on file  . Highest education level: Not on file    Occupational History  . Not on file  Social Needs  . Financial resource strain: Not on file  . Food insecurity:    Worry: Not on file    Inability: Not on file  . Transportation needs:    Medical: Not on file    Non-medical: Not on file  Tobacco Use  . Smoking status: Never Smoker  . Smokeless tobacco: Never Used  Substance and Sexual Activity  . Alcohol use: No  . Drug use: No  . Sexual activity: Yes    Birth control/protection: None  Lifestyle  . Physical activity:    Days per week: Not on file    Minutes per session: Not on file  . Stress: Not on file  Relationships  . Social connections:    Talks on phone: Not on file    Gets together: Not on file    Attends religious service: Not on file    Active member of club or organization: Not on file    Attends meetings of clubs or organizations: Not on file    Relationship status: Not on file  Other Topics Concern  . Not on file  Social History Narrative  . Not on file   History reviewed. No pertinent surgical history. Past Medical History:  Diagnosis Date  . Anxiety   . Back pain with right-sided radiculopathy   . Depression   . High cholesterol    BP 129/86 (  BP Location: Left Arm, Patient Position: Sitting, Cuff Size: Normal)   Pulse 79   Resp 14   Ht 5\' 8"  (1.727 m)   Wt 173 lb (78.5 kg)   SpO2 95%   BMI 26.30 kg/m   Opioid Risk Score:   Fall Risk Score:  `1  Depression screen PHQ 2/9  [facility] 09/24/2017 09/21/2017 07/29/2017 05/01/2017 09/12/2016 07/18/2016 06/06/2016  Decreased Interest 2 2 2 2 1 2 3   Down, Depressed, Hopeless 2 2 3 2 2 3 3   PHQ - 2 Score 4 4 5 4 3 5 6   Altered sleeping 3 2 2 3 3 2 3   Tired, decreased energy 2 2 3 3 3 1 2   Change in appetite 3 2 3 2 2 2 1   Feeling bad or failure about yourself  2 2 3 2 1 1 3   Trouble concentrating 2 2 1 2 1 1 2   Moving slowly or fidgety/restless 3 3 3 3 3 1 2   Suicidal thoughts 0 0 2 1 1  - 2  PHQ-9 Score 19 17 22 20 17 13 21   Difficult  doing work/chores Very difficult - - - - - -    Review of Systems  Constitutional: Negative.   HENT: Negative.   Eyes: Negative.   Respiratory: Negative.   Cardiovascular: Negative.   Gastrointestinal: Negative.   Endocrine: Negative.   Genitourinary: Negative.   Musculoskeletal: Positive for back pain, gait problem and neck pain.       Spasms   Skin: Negative.   Allergic/Immunologic: Negative.   Neurological: Positive for weakness and numbness.       Tingling   Psychiatric/Behavioral: Positive for dysphoric mood.  All other systems reviewed and are negative.      Objective:   Physical Exam  Constitutional: He is oriented to person, place, and time. He appears well-developed and well-nourished. No distress.  HENT:  Head: Normocephalic and atraumatic.  Eyes: Pupils are equal, round, and reactive to light. EOM are normal.  Neck: Normal range of motion.  Musculoskeletal:       Lumbar back: He exhibits decreased range of motion and tenderness. He exhibits no deformity.  Neurological: He is alert and oriented to person, place, and time. Gait abnormal.  Patient ambulates without evidence of toe drag or knee instability Motor strength is 4/5 limited by pain in the right hip flexion knee extension ankle dorsiflexion 5/5 in the left hip flexor knee extensor ankle dorsiflexor.  Skin: Skin is warm and dry. He is not diaphoretic.  Nursing note and vitals reviewed.   Positive straight leg raise on the right side       Assessment & Plan:  #1.  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  He has had excellent result with right transforaminal epidural steroid injection at L5-S1 level.  We discussed that it is difficult to predict duration of response.  He is currently approximately 6 weeks post.  We also discussed through the interpreter that this can be repeated in the future but should be  limited to no more than 3 or 4 injections/year. Have given him Spanish language basic back exercises and instructed him to perform each 1 daily.  If there is one particular exercise that causes some increased pain such as extension exercises he is to continue the other exercises and dropped the one that he does not tolerate well He will follow-up with  me on a as needed basis.  We discussed that he should not wait until his radicular pain is very severe rather call back once it appears that his injection is starting to wear off  Over half of the 25 min visit was spent counseling and coordinating care.  Went over spine model, went over the injection technique as well as his exercises.  Interpreter was assisting.

## 2017-11-27 ENCOUNTER — Ambulatory Visit: Payer: Self-pay | Admitting: Physical Medicine & Rehabilitation

## 2017-12-13 DIAGNOSIS — G4733 Obstructive sleep apnea (adult) (pediatric): Secondary | ICD-10-CM | POA: Diagnosis not present

## 2018-02-16 ENCOUNTER — Encounter (HOSPITAL_COMMUNITY): Payer: Self-pay | Admitting: Psychiatry

## 2018-02-16 ENCOUNTER — Ambulatory Visit (INDEPENDENT_AMBULATORY_CARE_PROVIDER_SITE_OTHER): Payer: PPO | Admitting: Psychiatry

## 2018-02-16 VITALS — BP 122/70 | Ht 69.0 in | Wt 171.0 lb

## 2018-02-16 DIAGNOSIS — F331 Major depressive disorder, recurrent, moderate: Secondary | ICD-10-CM

## 2018-02-16 DIAGNOSIS — F5101 Primary insomnia: Secondary | ICD-10-CM

## 2018-02-16 DIAGNOSIS — F411 Generalized anxiety disorder: Secondary | ICD-10-CM | POA: Diagnosis not present

## 2018-02-16 MED ORDER — ARIPIPRAZOLE 2 MG PO TABS: 2.0000 mg | ORAL_TABLET | Freq: Every day | ORAL | 0 refills | Status: DC

## 2018-02-16 MED ORDER — PAROXETINE HCL 40 MG PO TABS: 40.0000 mg | ORAL_TABLET | Freq: Every day | ORAL | 0 refills | Status: DC

## 2018-02-16 MED ORDER — TRAZODONE HCL 50 MG PO TABS: 50.0000 mg | ORAL_TABLET | Freq: Every day | ORAL | 0 refills | Status: DC

## 2018-02-16 MED ORDER — NORTRIPTYLINE HCL 75 MG PO CAPS: 75.0000 mg | ORAL_CAPSULE | Freq: Every day | ORAL | 0 refills | Status: DC
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
## 2018-02-22 ENCOUNTER — Encounter: Payer: Self-pay | Admitting: Nurse Practitioner

## 2018-02-22 ENCOUNTER — Ambulatory Visit: Payer: PPO | Attending: Nurse Practitioner | Admitting: Nurse Practitioner

## 2018-02-22 VITALS — BP 149/91 | HR 66 | Temp 98.8°F | Ht 68.0 in | Wt 179.4 lb

## 2018-02-22 DIAGNOSIS — F329 Major depressive disorder, single episode, unspecified: Secondary | ICD-10-CM | POA: Diagnosis not present

## 2018-02-22 DIAGNOSIS — G8929 Other chronic pain: Secondary | ICD-10-CM | POA: Diagnosis not present

## 2018-02-22 DIAGNOSIS — K219 Gastro-esophageal reflux disease without esophagitis: Secondary | ICD-10-CM

## 2018-02-22 DIAGNOSIS — Z7982 Long term (current) use of aspirin: Secondary | ICD-10-CM | POA: Insufficient documentation

## 2018-02-22 DIAGNOSIS — R0981 Nasal congestion: Secondary | ICD-10-CM

## 2018-02-22 DIAGNOSIS — F419 Anxiety disorder, unspecified: Secondary | ICD-10-CM | POA: Insufficient documentation

## 2018-02-22 DIAGNOSIS — E782 Mixed hyperlipidemia: Secondary | ICD-10-CM

## 2018-02-22 DIAGNOSIS — Z79899 Other long term (current) drug therapy: Secondary | ICD-10-CM | POA: Diagnosis not present

## 2018-02-22 DIAGNOSIS — Z9989 Dependence on other enabling machines and devices: Secondary | ICD-10-CM

## 2018-02-22 DIAGNOSIS — M5441 Lumbago with sciatica, right side: Secondary | ICD-10-CM | POA: Diagnosis not present

## 2018-02-22 DIAGNOSIS — G4733 Obstructive sleep apnea (adult) (pediatric): Secondary | ICD-10-CM

## 2018-02-22 MED ORDER — ATORVASTATIN CALCIUM 40 MG PO TABS: 40.0000 mg | ORAL_TABLET | Freq: Every day | ORAL | 1 refills | Status: DC

## 2018-02-22 MED ORDER — OMEPRAZOLE 20 MG PO CPDR: 20.0000 mg | DELAYED_RELEASE_CAPSULE | Freq: Every day | ORAL | 1 refills | Status: DC

## 2018-02-22 MED ORDER — FLUTICASONE PROPIONATE 50 MCG/ACT NA SUSP: 1.0000 | Freq: Every day | NASAL | 0 refills | Status: DC

## 2018-02-22 MED ORDER — METHOCARBAMOL 500 MG PO TABS: 1000.0000 mg | ORAL_TABLET | Freq: Three times a day (TID) | ORAL | 2 refills | Status: AC | PRN

## 2018-02-22 MED ORDER — NAPROXEN 500 MG PO TABS: 500.0000 mg | ORAL_TABLET | Freq: Two times a day (BID) | ORAL | 2 refills | Status: AC

## 2018-02-22 MED ORDER — ACETAMINOPHEN-CODEINE 300-30 MG PO TABS: 1.0000 | ORAL_TABLET | Freq: Three times a day (TID) | ORAL | 0 refills | Status: DC | PRN

## 2018-02-22 NOTE — Progress Notes (Addendum)
Assessment & Plan:  Joshua [name] was seen today for follow-up.  Diagnoses and all orders for this visit:  Chronic bilateral low back pain with right-sided sciatica -     Acetaminophen-Codeine (TYLENOL/CODEINE #3) 300-30 MG tablet; Take 1-2 tablets by mouth every 8 (eight) hours as needed for pain. -     naproxen (NAPROSYN) 500 MG tablet; Take 1 tablet (500 mg total) by mouth 2 (two) times daily with a meal. -     omeprazole (PRILOSEC) 20 MG capsule; Take 1 capsule (20 mg total) by mouth daily. -     methocarbamol (ROBAXIN) 500 MG tablet; Take 2 tablets (1,000 mg total) by mouth every 8 (eight) hours as needed for muscle spasms. Work on losing weight to help reduce back pain. May alternate with heat and ice application for pain relief. May also alternate with acetaminophen  as prescribed for back pain. Other alternatives include massage, acupuncture and water aerobics.  You must stay active and avoid a sedentary lifestyle.   OSA on CPAP Patient endorses nightly compliance with CPAP. He denies any episodes of excessive insomnia,  daytime sleepiness or hypersomnia.   Mixed hyperlipidemia -     Lipid panel -     atorvastatin (LIPITOR) 40 MG tablet; Take 1 tablet (40 mg total) by mouth daily. INSTRUCTIONS: Work on a low fat, heart healthy diet and participate in regular aerobic exercise program by working out at least 150 minutes per week; 5 days a week-30 minutes per day. Avoid red meat, fried foods. junk foods, sodas, sugary drinks, unhealthy snacking, alcohol and smoking.  Drink at least 48oz of water per day and monitor your carbohydrate intake daily.   Gastroesophageal reflux disease, esophagitis presence not specified -     omeprazole (PRILOSEC) 20 MG capsule; Take 1 capsule (20 mg total) by mouth daily. INSTRUCTIONS: Avoid GERD Triggers: acidic, spicy or fried foods, caffeine, coffee, sodas,  alcohol and chocolate.    Nasal Congestion -     fluticasone (FLONASE) 50 MCG/ACT nasal spray;  Place 1-2 sprays into both nostrils daily.  Patient has been counseled on age-appropriate routine health concerns for screening and prevention. These are reviewed and up-to-date. Referrals have been placed accordingly. Immunizations are up-to-date or declined.    Subjective:   Chief Complaint  Patient presents with  . Follow-up    Pt. is here to follow-up on back pain. Pt. is asking refill for Tylenol 3 and nasal spray.    HPI Joshua [name] 47 y.o. male presents to office today for follow up to back pain. He had an apointment with PMR in August is currently s/p epidural steroid injection. Reports improvement of his back pain. He is limited to no more than 3-4 injections per year. At this time we will continue to control his pain with tylenol #3, muscle relaxants and NSAIDs all to be used sparingly as instructed.   Hyperlipidemia LDL not at goal. Patient presents for follow up to hyperlipidemia.  He is medication compliant taking Lipitor 20mg  daily. He is diet compliant and denies lower extremity edema, palpitations and skin xanthelasma or statin intolerance including myalgias.  Lab Results  Component Value Date   CHOL 193 11/20/2017   Lab Results  Component Value Date   HDL 34 (L) 11/20/2017   Lab Results  Component Value Date   LDLCALC 128 (H) 11/20/2017   Lab Results  Component Value Date   TRIG 154 (H) 11/20/2017   Lab Results  Component Value Date   CHOLHDL 5.7 (  H) 11/20/2017   OSA on CPAP Sleep study was ordered by his psychiatrist. He has been using his CPAP nightly since obtaining it. He does note decreased fatigue and insomnia. NO issues with the mask or any of the equipment pieces.   Review of Systems  Constitutional: Negative for fever, malaise/fatigue and weight loss.  HENT: Positive for congestion and sinus pain. Negative for ear discharge, ear pain, hearing loss, nosebleeds and tinnitus.   Eyes: Negative.  Negative for blurred vision, double vision and  photophobia.  Respiratory: Negative.  Negative for cough and shortness of breath.   Cardiovascular: Negative.  Negative for chest pain, palpitations and leg swelling.  Gastrointestinal: Positive for heartburn. Negative for nausea and vomiting.  Musculoskeletal: Positive for back pain. Negative for myalgias.  Neurological: Negative.  Negative for dizziness, focal weakness, seizures and headaches.  Psychiatric/Behavioral: Positive for depression. Negative for suicidal ideas. The patient is nervous/anxious.     Past Medical History:  Diagnosis Date  . Anxiety   . Back pain with right-sided radiculopathy   . Depression   . High cholesterol     History reviewed. No pertinent surgical history.  Family History  Problem Relation Age of Onset  . Hypertension Paternal Uncle   . Diabetes Paternal Uncle     Social History Reviewed with no changes to be made today.   Outpatient Medications Prior to Visit  Medication Sig Dispense Refill  . ARIPiprazole (ABILIFY) 2 MG tablet Take 1 tablet (2 mg total) by mouth daily. 90 tablet 0  . aspirin 81 MG tablet Take 1 tablet (81 mg total) by mouth daily. 90 tablet 3  . nortriptyline (PAMELOR) 75 MG capsule Take 1 capsule (75 mg total) by mouth at bedtime. 90 capsule 0  . PARoxetine (PAXIL) 40 MG tablet Take 1 tablet (40 mg total) by mouth daily. 90 tablet 0  . traZODone (DESYREL) 50 MG tablet Take 1 tablet (50 mg total) by mouth at bedtime. 90 tablet 0  . Acetaminophen-Codeine (TYLENOL/CODEINE #3) 300-30 MG tablet Take 1-2 tablets by mouth every 8 (eight) hours as needed for pain. 60 tablet 0  . atorvastatin (LIPITOR) 20 MG tablet Take 1 tablet (20 mg total) by mouth daily. 90 tablet 1  . methocarbamol (ROBAXIN) 500 MG tablet Take 2 tablets (1,000 mg total) by mouth every 8 (eight) hours as needed for muscle spasms. 90 tablet 1  . naproxen (NAPROSYN) 500 MG tablet Take 1 tablet (500 mg total) by mouth 2 (two) times daily with a meal. 60 tablet 1  .  omeprazole (PRILOSEC) 20 MG capsule Take 1 capsule (20 mg total) by mouth daily. 30 capsule 1  . fluticasone (FLONASE) 50 MCG/ACT nasal spray Place into the nose.     No facility-administered medications prior to visit.     Allergies  Allergen Reactions  . Flexeril [Cyclobenzaprine]     Pt turns very aggressive       Objective:    BP (!) 149/91 (BP Location: Right Arm, Patient Position: Sitting, Cuff Size: Normal)   Pulse 66   Temp 98.8 F (37.1 C) (Oral)   Ht 5\' 8"  (1.727 m)   Wt 179 lb 6.4 oz (81.4 kg)   SpO2 96%   BMI 27.28 kg/m  Wt Readings from Last 3 Encounters:  02/22/18 179 lb 6.4 oz (81.4 kg)  02/16/18 171 lb (77.6 kg)  11/26/17 173 lb (78.5 kg)    Physical Exam  Constitutional: He is oriented to person, place, and time. He appears  well-developed and well-nourished. He is cooperative.  HENT:  Head: Normocephalic and atraumatic.  Right Ear: Hearing, tympanic membrane, external ear and ear canal normal.  Left Ear: Hearing, tympanic membrane, external ear and ear canal normal.  Nose: Mucosal edema present.  Eyes: EOM are normal.  Neck: Normal range of motion.  Cardiovascular: Normal rate, regular rhythm and normal heart sounds. Exam reveals no gallop and no friction rub.  No murmur heard. Pulmonary/Chest: Effort normal and breath sounds normal. No tachypnea. No respiratory distress. He has no decreased breath sounds. He has no wheezes. He has no rhonchi. He has no rales. He exhibits no tenderness.  Abdominal: Bowel sounds are normal.  Musculoskeletal: He exhibits no edema or deformity.       Lumbar back: He exhibits decreased range of motion, tenderness and pain.  Using a cane today for stability and ambulation  Neurological: He is alert and oriented to person, place, and time. Coordination normal.  Skin: Skin is warm and dry.  Psychiatric: He has a normal mood and affect. His behavior is normal. Judgment and thought content normal.  Nursing note and vitals  reviewed.      Patient has been counseled extensively about nutrition and exercise as well as the importance of adherence with medications and regular follow-up. The patient was given clear instructions to go to ER or return to medical center if symptoms don't improve, worsen or new problems develop. The patient verbalized understanding.   Follow-up: Return in about 3 months (around 05/25/2018) for Back Pain.   Claiborne Rigg, FNP-BC [facility] [place], [place] [phone]   02/22/2018, 5:31 PM

## 2018-02-22 NOTE — Patient Instructions (Signed)
Radiculopata lumbosacra (Lumbosacral Radiculopathy) La radiculopata lumbosacra es una afeccin que compromete los nervios raqudeos, y las races nerviosas de la parte baja de la espalda y Chief Operating Officer. La afeccin se manifiesta cuando estos nervios y las races nerviosas se desplazan o se inflaman y causan sntomas. CAUSAS Este trastorno puede ser causado por:  Presin en un disco que sobresale de su lugar (hernia de disco). Un disco es una placa de cartlago que separa los huesos de la columna.  Degeneracin discal.  Un estrechamiento de los huesos de la parte baja de la espalda (estenosis del conducto raqudeo).  Un tumor.  Una infeccin.  Una lesin que ejerce una presin repentina en los discos que amortiguan los huesos de la parte ms baja de la columna. FACTORES DE RIESGO Es ms probable que esta afeccin se manifieste en:  Los hombres que [employer] 30 y 16XWR.  Las mujeres que tienen entre 50 y 60AVW.  Las personas que levantan cargas de forma inadecuada.  Las personas con sobrepeso o que llevan un estilo de vida sedentario.  Los fumadores.  Las personas que realizan actividades repetitivas que tensionan la columna vertebral. SNTOMAS Los sntomas de esta afeccin incluyen lo siguiente:  Dolor que baja por la espalda hasta las piernas (citica). Este es el sntoma ms frecuente. El dolor puede empeorar al sentarse, toser o estornudar.  Dolor y Physicist, medical los brazos y la piernas.  Debilidad muscular.  Hormigueo.  Prdida del control del intestino o de la vejiga. DIAGNSTICO Esta afeccin se diagnostica mediante un examen fsico y la historia clnica. Si el dolor es prolongado, pueden hacerle estudios, por ejemplo:  Health visitor.  Radiografas.  Tomografa computarizada.  Mielografa.  Estudio de [employer]. TRATAMIENTO Esta afeccin suele tratarse con lo siguiente:  Aplicacin de compresas calientes y fras en las zonas  afectadas.  Estiramientos para mejorar la flexibilidad.  Ejercicios para fortalecer los msculos de la espalda.  Fisioterapia.  Analgsicos.  Una inyeccin de corticoides en la columna vertebral. En algunos los casos, no se necesita tratamiento. Si la afeccin es de larga duracin (crnica) o si los sntomas son intensos, el tratamiento puede incluir ciruga o cambios en el estilo de vida, como seguir un plan para bajar de peso. INSTRUCCIONES PARA EL CUIDADO EN EL HOGAR Medicamentos  Tome los medicamentos solamente como se lo haya indicado el mdico.  No conduzca ni opere maquinaria pesada mientras toma analgsicos. Cuidado de la lesin  Aplique una compresa caliente en la zona lesionada como le haya indicado el mdico.  Aplique hielo en la zona afectada: ? Ponga el hielo en una bolsa plstica. ? Coloque una [employer] piel y la bolsa de hielo. ? Deje el hielo durante 20 a , cada 2horas, mientras est despierto o cuando lo necesite. O bien, djelo durante todo el [employer] le haya indicado el mdico. Otras indicaciones  Si le explicaron cmo hacer ejercicios o estiramientos, hgalos como se lo haya indicado el mdico.  Si el mdico le recomend una dieta o un programa de ejercicios, cmplalos como se lo hayan indicado.  Concurra a todas las visitas de control como se lo haya indicado el mdico. Esto es importante. SOLICITE ATENCIN MDICA SI:  El dolor no mejora con el tiempo, incluso si est tomando analgsicos. SOLICITE ATENCIN MDICA DE INMEDIATO SI:  Siente un dolor intenso.  El dolor empeora repentinamente.  Aumenta la debilidad en las piernas.  Pierde la capacidad de controlar la vejiga o el intestino.  Tiene dificultad para caminar o mantener el equilibrio.  Tiene fiebre. Esta informacin no tiene Theme park manager el consejo del mdico. Asegrese de hacerle al mdico cualquier pregunta que tenga. Document Released: 01/08/2005 Document  Revised: 08/15/2014 Document Reviewed: 03/27/2014 Elsevier Interactive Patient Education  [employer].

## 2018-02-23 LAB — LIPID PANEL
Chol/HDL Ratio: 6.6 ratio — ABNORMAL HIGH (ref 0.0–5.0)
Cholesterol, Total: 226 mg/dL — ABNORMAL HIGH (ref 100–199)
HDL: 34 mg/dL — AB (ref >39)
LDL Calculated: 149 mg/dL — ABNORMAL HIGH (ref 0–99)
Triglycerides: 217 mg/dL — ABNORMAL HIGH (ref 0–149)
VLDL CHOLESTEROL CAL: 43 mg/dL — AB (ref 5–40)

## 2018-05-19 ENCOUNTER — Ambulatory Visit (HOSPITAL_COMMUNITY): Payer: PPO | Admitting: Psychiatry

## 2018-05-25 ENCOUNTER — Ambulatory Visit: Payer: Self-pay | Admitting: Nurse Practitioner

## 2018-05-27 DIAGNOSIS — J3089 Other allergic rhinitis: Secondary | ICD-10-CM | POA: Diagnosis not present

## 2018-06-22 ENCOUNTER — Other Ambulatory Visit: Payer: Self-pay | Admitting: Nurse Practitioner

## 2018-06-22 DIAGNOSIS — G8929 Other chronic pain: Secondary | ICD-10-CM

## 2018-06-22 DIAGNOSIS — M5441 Lumbago with sciatica, right side: Principal | ICD-10-CM

## 2018-06-23 ENCOUNTER — Telehealth: Payer: Self-pay | Admitting: Nurse Practitioner

## 2018-06-23 NOTE — Telephone Encounter (Signed)
1) Medication(s) Requested (by name):  Acetaminophen-Codeine 300-30 MG 1-2 tablets Oral Every 8 hours PRN    ARIPiprazole 2 mg Oral Daily    Aspirin 81 mg Oral Daily    Nortriptyline HCl 75 mg Oral Daily at bedtime    PARoxetine HCl 40 mg Oral Daily    traZODone HCl 50 mg Oral Daily at bedtime   2) Pharmacy of Choice:   [facility] [address] [facility]

## 2018-06-24 DIAGNOSIS — G4733 Obstructive sleep apnea (adult) (pediatric): Secondary | ICD-10-CM | POA: Diagnosis not present

## 2018-06-28 NOTE — Telephone Encounter (Signed)
I am only able to fill Tylenol #3. Please let patient know his other medications should be filled by psychiatry. Dr. Lolly Mustache

## 2018-06-28 NOTE — Telephone Encounter (Signed)
Patient unavailable. DPR on file to speak with wife and informed of message. Patient wife understood.

## 2018-07-06 ENCOUNTER — Ambulatory Visit: Payer: Self-pay | Admitting: Pulmonary Disease

## 2018-07-12 DIAGNOSIS — G4733 Obstructive sleep apnea (adult) (pediatric): Secondary | ICD-10-CM | POA: Diagnosis not present

## 2018-07-30 ENCOUNTER — Other Ambulatory Visit: Payer: Self-pay

## 2018-07-30 ENCOUNTER — Encounter: Payer: Self-pay | Admitting: Nurse Practitioner

## 2018-07-30 ENCOUNTER — Ambulatory Visit: Payer: PPO | Attending: Pulmonary Disease | Admitting: Nurse Practitioner

## 2018-07-30 DIAGNOSIS — M5441 Lumbago with sciatica, right side: Secondary | ICD-10-CM | POA: Diagnosis not present

## 2018-07-30 DIAGNOSIS — E782 Mixed hyperlipidemia: Secondary | ICD-10-CM | POA: Diagnosis not present

## 2018-07-30 DIAGNOSIS — G8929 Other chronic pain: Secondary | ICD-10-CM

## 2018-07-30 DIAGNOSIS — F411 Generalized anxiety disorder: Secondary | ICD-10-CM

## 2018-07-30 DIAGNOSIS — F331 Major depressive disorder, recurrent, moderate: Secondary | ICD-10-CM

## 2018-07-30 DIAGNOSIS — F5101 Primary insomnia: Secondary | ICD-10-CM

## 2018-07-30 IMAGING — MR MR CERVICAL SPINE W/O CM
4 of 5 series · 19 of 48 positions shown · non-contrast
Comparison: Cervical spine radiograph 07/21/2016

CLINICAL DATA: Chronic neck pain

EXAM:
MRI CERVICAL SPINE WITHOUT CONTRAST
TECHNIQUE: Multiplanar, multisequence MR imaging of the cervical spine was
performed. No intravenous contrast was administered.

[Series 3: T2 · sagittal · 3.0mm · 0.43mm/px · 7 of 15 slices shown (1 of 2)]
[im 1/15]
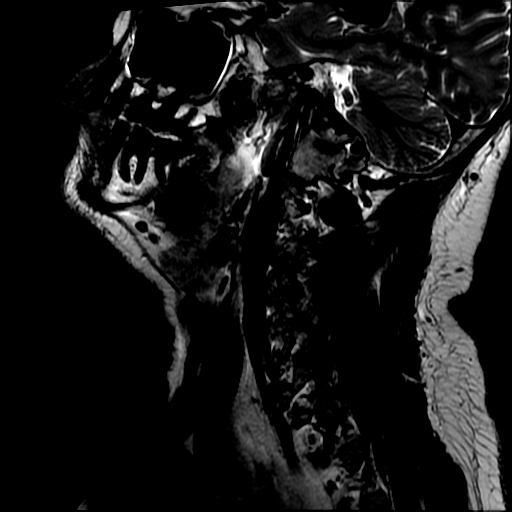
[im 3/15]
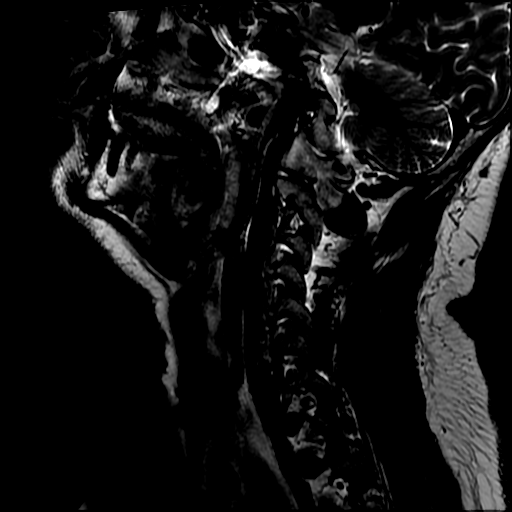
[im 5/15]
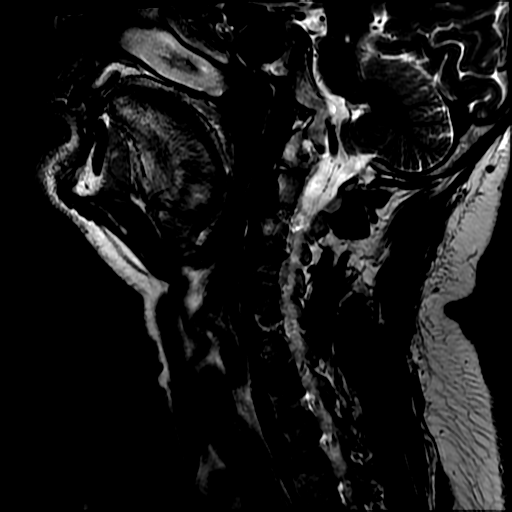
[im 8/15]
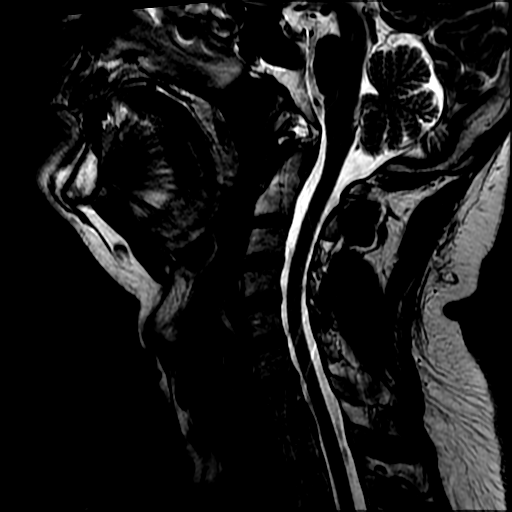
[im 10/15]
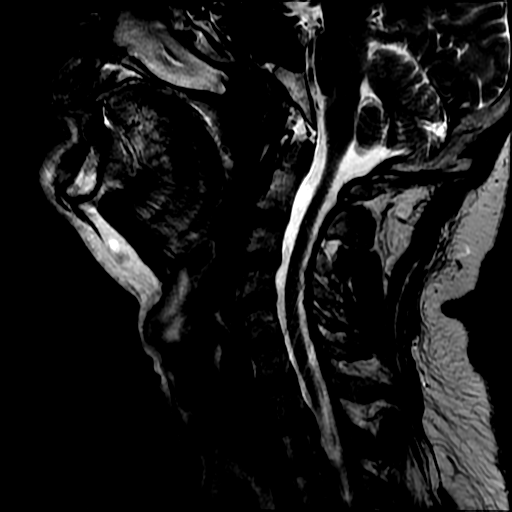
[im 12/15]
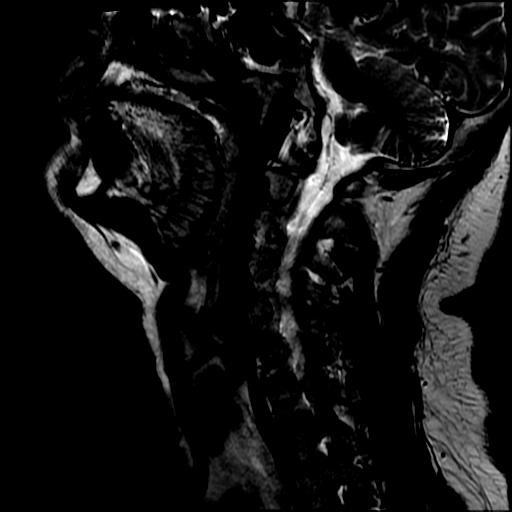
[im 15/15]
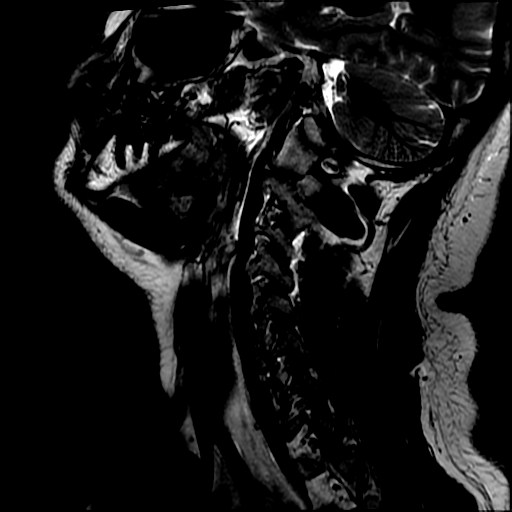

[Series 4: FLAIR · sagittal · 3.0mm · 0.43mm/px · 3 of 15 slices shown]
[im 3/15]
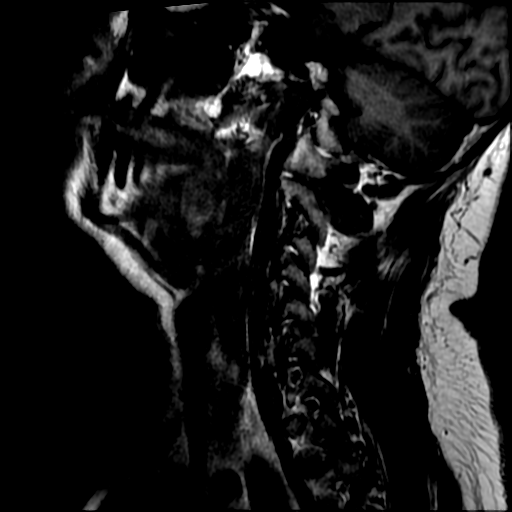
[im 9/15]
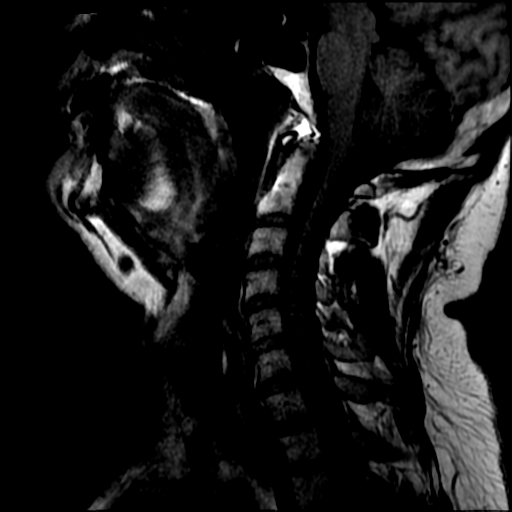
[im 15/15]
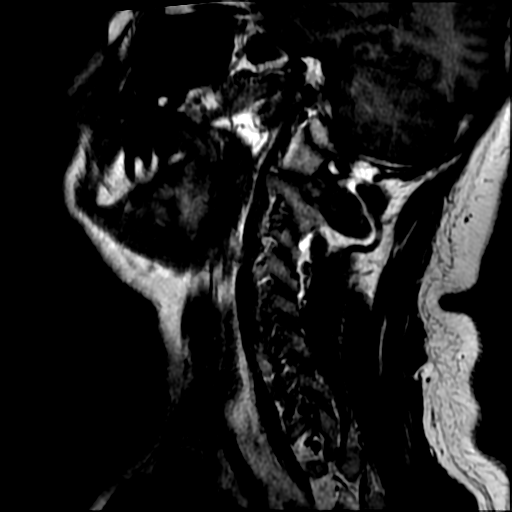

[Series 5: T2 · axial · 3.0mm · 0.35mm/px · z∈[-169,-90]mm · 6 of 30 slices shown (2 of 2)]
[im 1/30]
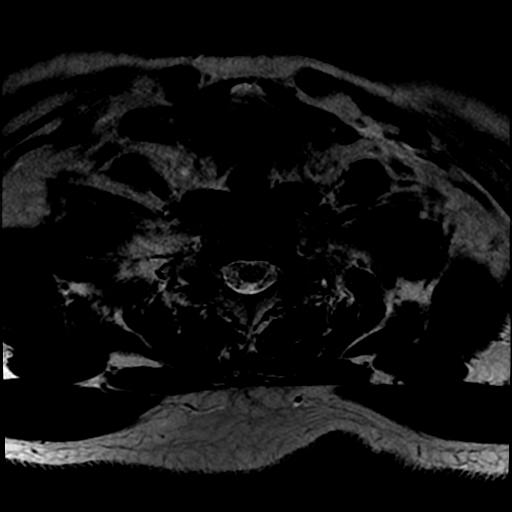
[im 5/30]
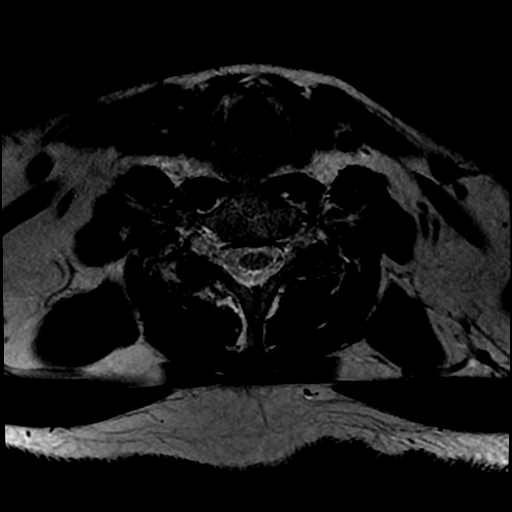
[im 10/30]
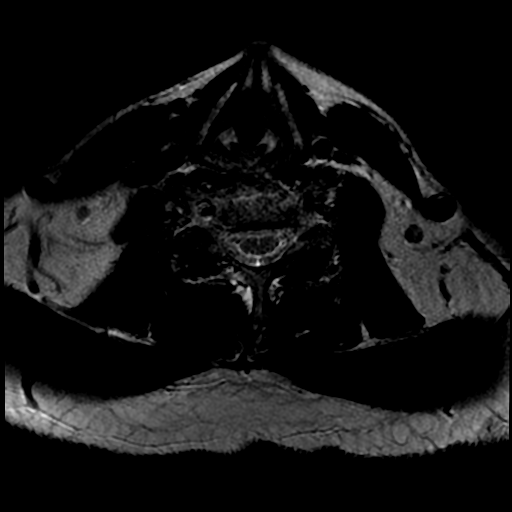
[im 13/30]
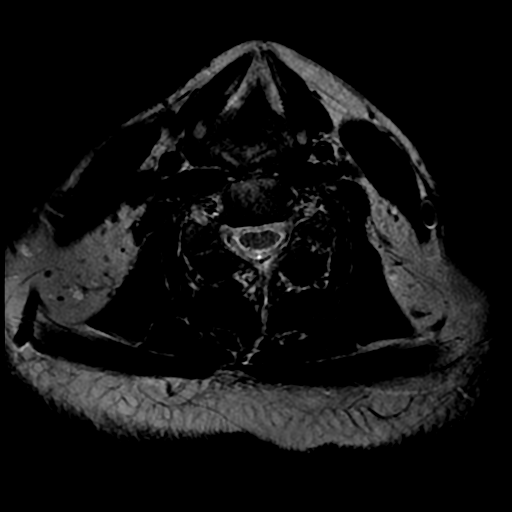
[im 15/30]
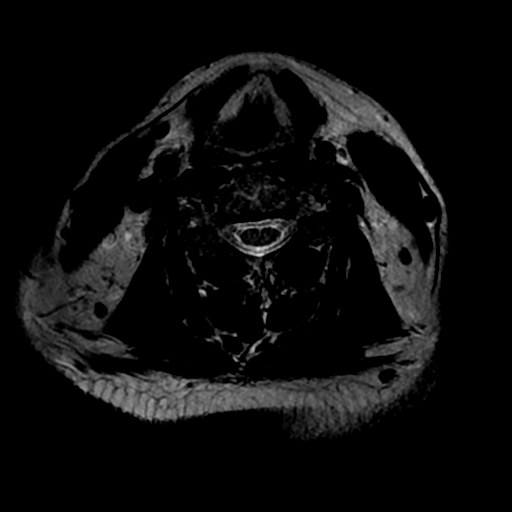
[im 25/30]
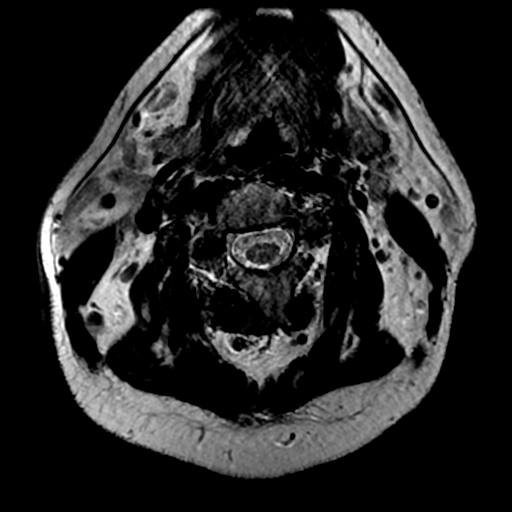

[Series 6: STIR · sagittal · 3.0mm · 0.43mm/px · 3 of 15 slices shown]
[im 3/15]
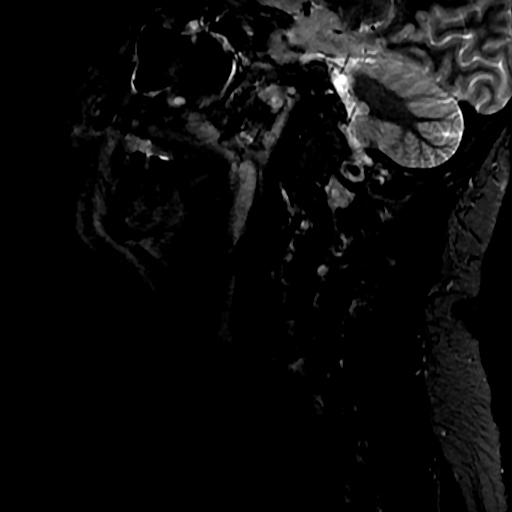
[im 9/15]
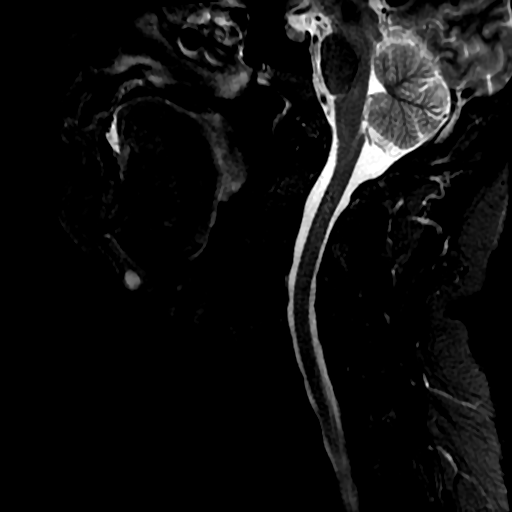
[im 15/15]
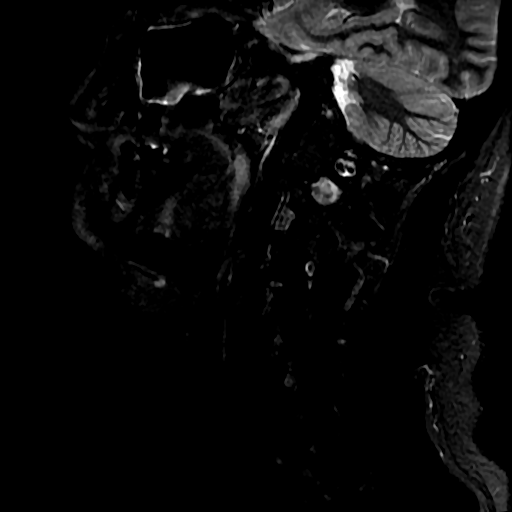

[19 of 48 positions shown; findings below may reference images not displayed]

FINDINGS: Alignment: Normal

Vertebrae: Normal marrow signal throughout.

Cord: Normal signal and caliber

Posterior Fossa, vertebral arteries, paraspinal tissues: Visualized
posterior fossa is normal. Vertebral artery flow voids are
preserved. Normal visualized paraspinal soft tissues.

Disc levels:

C1-C2: Normal.

C2-C3: Normal disc space and facets. No spinal canal or
neuroforaminal stenosis.

C3-C4: Minimal right-sided uncovertebral spurring without associated
stenosis.

C4-C5: Normal disc space and facets. No spinal canal or
neuroforaminal stenosis.

C5-C6: Normal disc space and facets. No spinal canal or
neuroforaminal stenosis.

C6-C7: Normal disc space and facets. No spinal canal or
neuroforaminal stenosis.

C7-T1: Normal disc space and facets. No spinal canal or
neuroforaminal stenosis.
IMPRESSION: Normal cervical spine for age. No spinal canal or neural foraminal
stenosis. The discs are normal. There is minimal uncovertebral
degenerative change, greatest at C3-C4.

## 2018-07-30 MED ORDER — NORTRIPTYLINE HCL 75 MG PO CAPS: 75.0000 mg | ORAL_CAPSULE | Freq: Every day | ORAL | 0 refills | Status: DC

## 2018-07-30 MED ORDER — TRAZODONE HCL 50 MG PO TABS: 50.0000 mg | ORAL_TABLET | Freq: Every day | ORAL | 0 refills | Status: DC

## 2018-07-30 MED ORDER — ATORVASTATIN CALCIUM 40 MG PO TABS: 40.0000 mg | ORAL_TABLET | Freq: Every day | ORAL | 1 refills | Status: DC

## 2018-07-30 MED ORDER — ACETAMINOPHEN-CODEINE #3 300-30 MG PO TABS: 1.0000 | ORAL_TABLET | Freq: Three times a day (TID) | ORAL | 1 refills | Status: DC | PRN

## 2018-07-30 MED ORDER — PAROXETINE HCL 40 MG PO TABS: 40.0000 mg | ORAL_TABLET | Freq: Every day | ORAL | 0 refills | Status: DC

## 2018-07-30 NOTE — Progress Notes (Signed)
Virtual Visit via Telephone Note  I connected with Joshua [name] on 07/30/18 at  10:10 AM EDT by telephone and verified that I am speaking with the correct person using two identifiers. A Spanish interpreter was used for today's telemedicine visit: ID #[id number]   I discussed the limitations, risks, security and privacy concerns of performing an evaluation and management service by telephone and the availability of in person appointments. I also discussed with the patient that there may be a patient responsible charge related to this service. The patient expressed understanding and agreed to proceed.   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 Uses a cane to assist with ambulation. Taking Tylenol # 3 sparingly. Onset of back pain was 9 years ago. Aggravating factors: Bending, twisting, walking, standing or sitting for prolonged periods of time. He has seen a spine specialist in the past (PMR) and had epidural steroid injections which he reports were somewhat effective however back pain returned shortly after receiving the injections. He currently denies involuntary loss of bowel or bladder.  He has also tried PT in the past with multiple unsuccessful visit without significant improvement. He has not undergone surgical evaluation for this problem in the past.    Observations/Objective: Awake, Alert and Oriented x 3  Assessment and Plan Chronic bilateral low back pain with right-sided sciatica -     acetaminophen-codeine (TYLENOL #3) 300-30 MG tablet; Take 1-2 tablets by mouth every 8 (eight) hours as needed. for pain Reviewed [facility]. No unauthorized fills of controlled medications  Mixed hyperlipidemia -     atorvastatin (LIPITOR) 40 MG tablet; Take 1 tablet (40 mg total) by mouth daily. INSTRUCTIONS: Work on a low fat, heart healthy diet and participate in regular  aerobic exercise program by working out at least 150 minutes per week; 5 days a week-30 minutes per day. Avoid red meat, fried foods. junk foods, sodas, sugary drinks, unhealthy snacking, alcohol and smoking.  Drink at least 48oz of water per day and monitor your carbohydrate intake daily.  Lab Results  Component Value Date   LDLCALC 149 (H) 02/22/2018    MDD (major depressive disorder), recurrent episode, moderate (HCC) -     nortriptyline (PAMELOR) 75 MG capsule; Take 1 capsule (75 mg total) by mouth at bedtime. -     PARoxetine (PAXIL) 40 MG tablet; Take 1 tablet (40 mg total) by mouth daily. -     traZODone (DESYREL) 50 MG tablet; Take 1 tablet (50 mg total) by mouth at bedtime.  Sees Dr. Lolly MustacheArfeen (psych) however due to COVID-19 he has not been able to get an appointment. I refilled most of his medications however instructed him that I would not be able to refill abilify.   Generalized anxiety disorder -     nortriptyline (PAMELOR) 75 MG capsule; Take 1 capsule (75 mg total) by mouth at bedtime. -     PARoxetine (PAXIL) 40 MG tablet; Take 1 tablet (40 mg total) by mouth daily. -     traZODone (DESYREL) 50 MG tablet; Take 1 tablet (50 mg total) by mouth at bedtime.  Primary insomnia -     traZODone (DESYREL) 50 MG tablet; Take 1 tablet (50 mg total) by mouth at bedtime.    Follow Up Instructions: Follow up in 4-6 weeks for repeat imaging. May need to be referred to neurosurgery.   I discussed the assessment and treatment plan  with the patient. The patient was provided an opportunity to ask questions and all were answered. The patient agreed with the plan and demonstrated an understanding of the instructions.   The patient was advised to call back or seek an in-person evaluation if the symptoms worsen or if the condition fails to improve as anticipated.  I provided 14 minutes of non-face-to-face time during this encounter.   Claiborne Rigg, NP

## 2018-08-12 DIAGNOSIS — G4733 Obstructive sleep apnea (adult) (pediatric): Secondary | ICD-10-CM | POA: Diagnosis not present

## 2018-09-09 ENCOUNTER — Ambulatory Visit (INDEPENDENT_AMBULATORY_CARE_PROVIDER_SITE_OTHER): Payer: PPO | Admitting: Psychiatry

## 2018-09-09 ENCOUNTER — Encounter (HOSPITAL_COMMUNITY): Payer: Self-pay | Admitting: Psychiatry

## 2018-09-09 ENCOUNTER — Other Ambulatory Visit: Payer: Self-pay

## 2018-09-09 DIAGNOSIS — F331 Major depressive disorder, recurrent, moderate: Secondary | ICD-10-CM | POA: Diagnosis not present

## 2018-09-09 DIAGNOSIS — F5101 Primary insomnia: Secondary | ICD-10-CM | POA: Diagnosis not present

## 2018-09-09 DIAGNOSIS — F411 Generalized anxiety disorder: Secondary | ICD-10-CM | POA: Diagnosis not present

## 2018-09-09 MED ORDER — PAROXETINE HCL 40 MG PO TABS: 40.0000 mg | ORAL_TABLET | Freq: Every day | ORAL | 0 refills | Status: DC

## 2018-09-09 MED ORDER — ARIPIPRAZOLE 2 MG PO TABS: 2.0000 mg | ORAL_TABLET | Freq: Every day | ORAL | 0 refills | Status: DC

## 2018-09-09 MED ORDER — NORTRIPTYLINE HCL 75 MG PO CAPS: 75.0000 mg | ORAL_CAPSULE | Freq: Every day | ORAL | 0 refills | Status: DC

## 2018-09-09 MED ORDER — TRAZODONE HCL 50 MG PO TABS: 50.0000 mg | ORAL_TABLET | Freq: Every day | ORAL | 0 refills | Status: DC

## 2018-09-09 NOTE — Progress Notes (Signed)
Virtual Visit via Telephone Note  I connected with Joshua [name] on 09/09/18 at 11:30 AM EDT by telephone and verified that I am speaking with the correct person using two identifiers.   I discussed the limitations, risks, security and privacy concerns of performing an evaluation and management service by telephone and the availability of in person appointments. I also discussed with the patient that there may be a patient responsible charge related to this service. The patient expressed understanding and agreed to proceed.   History of Present Illness: Patient was evaluated on phone session with the help of translator ID [phone].  Patient apologized missing the last appointment due to flulike symptoms in November.  However his medicines were continued by his primary care physician.  On his last visit we increased nortriptyline 75 mg and he noticed much improvement in his sleep anxiety and depression.  He is taking trazodone, Paxil, nortriptyline and Abilify without any side effects.  He denies any tremors, shakes or any EPS.  We have recommended for therapy but he did not schedule appointment.  He endorsed overall his energy level is fair and denies any feeling of hopelessness or worthlessness.  He also uses CPAP.  Denies any agitation, anger, mania or any psychosis.  Due to COVID-19 he does not leave his house unless it is important.  He denies any hallucination or any paranoia.  He has chronic pain and sometimes it is so bad that he does not sleep and stays to himself.  Denies drinking or using any illegal substances.  He lives with his wife who is very supportive.  Patient has 4 children.  He is on disability since 2012.  Patient like to continue his current medication.    Past Psychiatric History: Patient had one history of psychiatric inpatient [place] in [place] when he wanted to walk into the traffic.  He has tried Risperdal, Cymbalta and temazepam with poor outcome.  He did seeing  psychiatrist in this office since October 2018.   Psychiatric Specialty Exam: Physical Exam  ROS  There were no vitals taken for this visit.There is no height or weight on file to calculate BMI.  General Appearance: NA  Eye Contact:  NA  Speech:  Slow  Volume:  Decreased  Mood:  Dysphoric  Affect:  NA  Thought Process:  Goal Directed  Orientation:  Full (Time, Place, and Person)  Thought Content:  Rumination  Suicidal Thoughts:  No  Homicidal Thoughts:  No  Memory:  Immediate;   Fair Recent;   Fair Remote;   Good  Judgement:  Good  Insight:  Fair  Psychomotor Activity:  NA  Concentration:  Concentration: Fair and Attention Span: Fair  Recall:  [employer] of Knowledge:  NA  Language:  speaks spanish  Akathisia:  NA  Handed:  Right  AIMS (if indicated):     Assets:  Desire for Improvement Housing Resilience Social Support  ADL's:  Intact  Cognition:  WNL  Sleep:   fair     Assessment and Plan: Major depressive disorder, recurrent.  Generalized anxiety disorder.  Primary insomnia.  Patient doing better on his current medication especially since nortriptyline increased to 75 mg.  He denies any tremors, shakes or any serotonin syndrome-like symptoms.  He like to continue his current medication.  He like to have up therapy appointment for coping skills.  Does not want to change medication.  Discussed medication side effects and benefits.  We will schedule appointment to see  a therapist in our office.  I explained recently be doing telemetry visits and once the office open he will come in for in person visits.  He agreed with the plan.  Recommend to call us back if is any question or any concern.  I review notes from his primary care physician.  Follow-up in 4 months.  Follow Up Instructions:    I discussed the assessment and treatment plan with the patient. The patient was provided an opportunity to ask questions and all were answered. The patient agreed with the plan and  demonstrated an understanding of the instructions.   The patient was advised to call back or seek an in-person evaluation if the symptoms worsen or if the condition fails to improve as anticipated.  I provided 30 minutes of non-face-to-face time during this encounter.   Cleotis NipperSyed T Aaiden Depoy, MD

## 2018-09-11 DIAGNOSIS — G4733 Obstructive sleep apnea (adult) (pediatric): Secondary | ICD-10-CM | POA: Diagnosis not present

## 2018-09-22 DIAGNOSIS — G4733 Obstructive sleep apnea (adult) (pediatric): Secondary | ICD-10-CM | POA: Diagnosis not present

## 2018-10-12 DIAGNOSIS — G4733 Obstructive sleep apnea (adult) (pediatric): Secondary | ICD-10-CM | POA: Diagnosis not present

## 2018-11-11 DIAGNOSIS — G4733 Obstructive sleep apnea (adult) (pediatric): Secondary | ICD-10-CM | POA: Diagnosis not present

## 2018-12-12 DIAGNOSIS — G4733 Obstructive sleep apnea (adult) (pediatric): Secondary | ICD-10-CM | POA: Diagnosis not present

## 2019-01-04 ENCOUNTER — Ambulatory Visit (INDEPENDENT_AMBULATORY_CARE_PROVIDER_SITE_OTHER): Payer: PPO | Admitting: Psychiatry

## 2019-01-04 ENCOUNTER — Encounter (HOSPITAL_COMMUNITY): Payer: Self-pay | Admitting: Psychiatry

## 2019-01-04 ENCOUNTER — Other Ambulatory Visit: Payer: Self-pay

## 2019-01-04 DIAGNOSIS — F411 Generalized anxiety disorder: Secondary | ICD-10-CM | POA: Diagnosis not present

## 2019-01-04 DIAGNOSIS — F5101 Primary insomnia: Secondary | ICD-10-CM | POA: Diagnosis not present

## 2019-01-04 DIAGNOSIS — F331 Major depressive disorder, recurrent, moderate: Secondary | ICD-10-CM | POA: Diagnosis not present

## 2019-01-04 MED ORDER — TRAZODONE HCL 50 MG PO TABS: 50.0000 mg | ORAL_TABLET | Freq: Every day | ORAL | 0 refills | Status: DC

## 2019-01-04 MED ORDER — NORTRIPTYLINE HCL 75 MG PO CAPS: 75.0000 mg | ORAL_CAPSULE | Freq: Every day | ORAL | 0 refills | Status: DC

## 2019-01-04 MED ORDER — ARIPIPRAZOLE 2 MG PO TABS: 2.0000 mg | ORAL_TABLET | Freq: Every day | ORAL | 0 refills | Status: DC

## 2019-01-04 MED ORDER — PAROXETINE HCL 40 MG PO TABS: 40.0000 mg | ORAL_TABLET | Freq: Every day | ORAL | 0 refills | Status: DC

## 2019-01-04 NOTE — Progress Notes (Signed)
Virtual Visit via Telephone Note  I connected with Joshua [name] on 01/04/19 at  2:20 PM EDT by telephone and verified that I am speaking with the correct person using two identifiers.   I discussed the limitations, risks, security and privacy concerns of performing an evaluation and management service by telephone and the availability of in person appointments. I also discussed with the patient that there may be a patient responsible charge related to this service. The patient expressed understanding and agreed to proceed.   History of Present Illness: Patient was evaluated by phone session with the help of translator [name] through language resources.  Patient is doing well on his medication.  Sometime he has tremors and exam but his sleep is improved and he is now sleeping at least 4 to 6 hours every night as compared to 3 to 4 hours before increase of nortriptyline.  He is not able to find any therapist who speaks Spanish in his area.  He describes his mood is better.  He denies any crying spells or any feeling of hopelessness or worthlessness.  He denies any mania, psychosis or any agitation.  He lives with his wife and 4 kids.  3 of his kids are doing virtual learning and sometimes it makes him very nervous due to Internet issues.  His wife is very supportive.  Patient wants to keep the medication however I recommended he can trial cutting down Paxil to 20 mg to see if his tremors get better.  He denies drinking or using any illegal substances.  He denies any suicidal thoughts.  Energy level is fair.   Past Psychiatric History:Patient had one history of psychiatric inpatient treatment in [place] in 2008 when he wanted to walk into the traffic. He has tried Risperdal, Cymbalta and temazepam with poor outcome. He did seeing psychiatrist in this office since October 2018.   Psychiatric Specialty Exam: Physical Exam  ROS  There were no vitals taken for this visit.There is no height or  weight on file to calculate BMI.  General Appearance: NA  Eye Contact:  NA  Speech:  Slow  Volume:  Normal  Mood:  Euthymic  Affect:  NA  Thought Process:  Goal Directed  Orientation:  Full (Time, Place, and Person)  Thought Content:  Rumination  Suicidal Thoughts:  No  Homicidal Thoughts:  No  Memory:  Immediate;   Good Recent;   Good Remote;   Good  Judgement:  Good  Insight:  Good  Psychomotor Activity:  NA  Concentration:  Concentration: Fair and Attention Span: Fair  Recall:  [employer] of Knowledge:  Fair  Language:  speaks spanish  Akathisia:  No  Handed:  Right  AIMS (if indicated):     Assets:  Desire for Improvement Housing Resilience Social Support  ADL's:  Intact  Cognition:  WNL  Sleep:   5-6 hrs      Assessment and Plan: Major depressive disorder, recurrent.  Generalized anxiety disorder.  Primary insomnia.  Discussed to try Paxil 20 mg to see if tremors get better.  However he is reluctant but like to give a try.  Continue nortriptyline 75 mg at bedtime, Abilify 2 mg daily, trazodone 50 mg at bedtime and he will try Paxil from 40 mg to 20 mg to help his tremors.  Patient like to have a Physicist, medical due to virtual therapy.  We will continue to look for a Physicist, medical.  I recommended to call us back if he  has any question or any concern.  Follow-up in 3 months.  Follow Up Instructions:    I discussed the assessment and treatment plan with the patient. The patient was provided an opportunity to ask questions and all were answered. The patient agreed with the plan and demonstrated an understanding of the instructions.   The patient was advised to call back or seek an in-person evaluation if the symptoms worsen or if the condition fails to improve as anticipated.  I provided 20 minutes of non-face-to-face time during this encounter.   Kathlee Nations, MD

## 2019-01-10 ENCOUNTER — Ambulatory Visit (HOSPITAL_COMMUNITY): Payer: PPO | Admitting: Psychiatry

## 2019-01-12 ENCOUNTER — Other Ambulatory Visit: Payer: Self-pay | Admitting: Nurse Practitioner

## 2019-01-12 DIAGNOSIS — G8929 Other chronic pain: Secondary | ICD-10-CM

## 2019-01-20 DIAGNOSIS — G4733 Obstructive sleep apnea (adult) (pediatric): Secondary | ICD-10-CM | POA: Diagnosis not present

## 2019-01-25 ENCOUNTER — Other Ambulatory Visit: Payer: Self-pay | Admitting: Physician Assistant

## 2019-01-25 ENCOUNTER — Other Ambulatory Visit: Payer: Self-pay | Admitting: Nurse Practitioner

## 2019-01-25 DIAGNOSIS — R0981 Nasal congestion: Secondary | ICD-10-CM

## 2019-01-25 DIAGNOSIS — G8929 Other chronic pain: Secondary | ICD-10-CM

## 2019-01-25 DIAGNOSIS — M5441 Lumbago with sciatica, right side: Secondary | ICD-10-CM

## 2019-02-21 ENCOUNTER — Telehealth: Payer: Self-pay | Admitting: Nurse Practitioner

## 2019-02-21 NOTE — Telephone Encounter (Signed)
Patient's wife call requesting  Refill of   Tylenol 3 and  Naproxen  To [facility] in [place] Thank you

## 2019-02-23 DIAGNOSIS — G4733 Obstructive sleep apnea (adult) (pediatric): Secondary | ICD-10-CM | POA: Diagnosis not present

## 2019-02-25 NOTE — Telephone Encounter (Signed)
Will route to PCP 

## 2019-02-27 ENCOUNTER — Other Ambulatory Visit: Payer: Self-pay | Admitting: Nurse Practitioner

## 2019-02-27 DIAGNOSIS — G8929 Other chronic pain: Secondary | ICD-10-CM

## 2019-02-27 MED ORDER — NAPROXEN 500 MG PO TABS: 500.0000 mg | ORAL_TABLET | Freq: Two times a day (BID) | ORAL | 0 refills | Status: DC

## 2019-02-27 MED ORDER — ACETAMINOPHEN-CODEINE #3 300-30 MG PO TABS: 1.0000 | ORAL_TABLET | Freq: Three times a day (TID) | ORAL | 0 refills | Status: DC | PRN

## 2019-02-27 NOTE — Telephone Encounter (Signed)
NEEDS OFFICE VISIT. 30 day supply sent

## 2019-03-01 NOTE — Telephone Encounter (Signed)
Attempt tor reach patient to inform he need to schedule an OV. No answer and LVM.

## 2019-03-14 DIAGNOSIS — G4733 Obstructive sleep apnea (adult) (pediatric): Secondary | ICD-10-CM | POA: Diagnosis not present

## 2019-04-04 ENCOUNTER — Ambulatory Visit (HOSPITAL_COMMUNITY): Payer: PPO | Admitting: Psychiatry

## 2019-04-11 ENCOUNTER — Other Ambulatory Visit: Payer: Self-pay

## 2019-04-11 ENCOUNTER — Ambulatory Visit (INDEPENDENT_AMBULATORY_CARE_PROVIDER_SITE_OTHER): Payer: PPO | Admitting: Psychiatry

## 2019-04-11 ENCOUNTER — Encounter (HOSPITAL_COMMUNITY): Payer: Self-pay | Admitting: Psychiatry

## 2019-04-11 DIAGNOSIS — F331 Major depressive disorder, recurrent, moderate: Secondary | ICD-10-CM

## 2019-04-11 DIAGNOSIS — F5101 Primary insomnia: Secondary | ICD-10-CM

## 2019-04-11 DIAGNOSIS — F411 Generalized anxiety disorder: Secondary | ICD-10-CM

## 2019-04-11 MED ORDER — PAROXETINE HCL 40 MG PO TABS: 40.0000 mg | ORAL_TABLET | Freq: Every day | ORAL | 0 refills | Status: DC

## 2019-04-11 MED ORDER — TRAZODONE HCL 50 MG PO TABS: 50.0000 mg | ORAL_TABLET | Freq: Every day | ORAL | 0 refills | Status: DC

## 2019-04-11 MED ORDER — NORTRIPTYLINE HCL 75 MG PO CAPS: 75.0000 mg | ORAL_CAPSULE | Freq: Every day | ORAL | 0 refills | Status: DC

## 2019-04-11 MED ORDER — ARIPIPRAZOLE 2 MG PO TABS: 2.0000 mg | ORAL_TABLET | Freq: Every day | ORAL | 0 refills | Status: DC

## 2019-04-11 NOTE — Progress Notes (Signed)
Virtual Visit via Telephone Note  I connected with Joshua [name] on 04/11/19 at  2:00 PM EST by telephone and verified that I am speaking with the correct person using two identifiers.   I discussed the limitations, risks, security and privacy concerns of performing an evaluation and management service by telephone and the availability of in person appointments. I also discussed with the patient that there may be a patient responsible charge related to this service. The patient expressed understanding and agreed to proceed.   History of Present Illness: Patient was evaluated by phone session.  Patient does not speak [place] and her daughter Daneen Schick translate him.  We do not have translator at this point and patient is agree to have his family member translate for him.  Patient told he tried cutting down the Paxil to 20 mg but he get more depressed and anxious.  We have recommended to reduce Paxil because he was having tremors.  However patient do not see any improvement in the tremors by reducing Paxil.  He feels his depression is a stable but continues to have insomnia and ruminative thoughts.  He denies any crying spells or any feeling of hopelessness or worthlessness.  He has mild tremors but he like to go back on original dose of Paxil.  He lives with his wife and 3 kids who are doing Holiday representative.  His wife is very supportive.  Denies drinking or using any illegal substances.  His appetite is okay.  His energy level is fair.  Like to continue amitriptyline, Abilify, trazodone and Paxil.  Past Psychiatric History: H/O one inpatient treatment in [place] in 2008 when walked into traffic. Tried Risperdal, Cymbalta and temazepam with poor outcome. Seeing psychiatrist in our office since October 2018.    Psychiatric Specialty Exam: Physical Exam  Review of Systems  There were no vitals taken for this visit.There is no height or weight on file to calculate BMI.  General Appearance: NA   Eye Contact:  NA  Speech:  Slow  Volume:  Decreased  Mood:  Dysphoric  Affect:  NA  Thought Process:  Goal Directed  Orientation:  Full (Time, Place, and Person)  Thought Content:  Rumination  Suicidal Thoughts:  No  Homicidal Thoughts:  No  Memory:  Immediate;   Good Recent;   Good Remote;   Good  Judgement:  Fair  Insight:  Present  Psychomotor Activity:  Tremor  Concentration:  Concentration: Fair and Attention Span: Fair  Recall:  [employer] of Knowledge:  Fair  Language:  speaks spanish  Akathisia:  No  Handed:  Right  AIMS (if indicated):     Assets:  Desire for Improvement Housing Resilience Social Support  ADL's:  Intact  Cognition:  WNL  Sleep:   5-6 hrs      Assessment and Plan: Major depressive disorder, recurrent.  Generalized anxiety disorder.  Primary insomnia.  We will increase Paxil 40 mg since 20 mg made him more depressed and anxious.  Do not see any improvement in the tremors by reducing the Paxil.  Discussed medication side effects and benefits.  Continue trazodone 50 mg at bedtime, Paxil 40 mg daily and nortriptyline 75 mg at bedtime and Abilify 2 mg daily.  We will schedule appointment in 3 months and have Spanish-speaking translator available on his next appointment.  I recommended to call us back if he has any question or any concern.  Follow-up in 3 months.  Follow Up Instructions:  I discussed the assessment and treatment plan with the patient. The patient was provided an opportunity to ask questions and all were answered. The patient agreed with the plan and demonstrated an understanding of the instructions.   The patient was advised to call back or seek an in-person evaluation if the symptoms worsen or if the condition fails to improve as anticipated.  I provided 20 minutes of non-face-to-face time during this encounter.   Kathlee Nations, MD

## 2019-04-13 DIAGNOSIS — G4733 Obstructive sleep apnea (adult) (pediatric): Secondary | ICD-10-CM | POA: Diagnosis not present

## 2019-05-07 ENCOUNTER — Other Ambulatory Visit: Payer: Self-pay | Admitting: Nurse Practitioner

## 2019-05-07 DIAGNOSIS — G8929 Other chronic pain: Secondary | ICD-10-CM

## 2019-05-07 DIAGNOSIS — M5441 Lumbago with sciatica, right side: Secondary | ICD-10-CM

## 2019-05-07 DIAGNOSIS — K219 Gastro-esophageal reflux disease without esophagitis: Secondary | ICD-10-CM

## 2019-05-08 MED ORDER — NAPROXEN 500 MG PO TABS: 500.0000 mg | ORAL_TABLET | Freq: Every day | ORAL | 0 refills | Status: DC | PRN

## 2019-05-08 MED ORDER — OMEPRAZOLE 20 MG PO CPDR: 20.0000 mg | DELAYED_RELEASE_CAPSULE | Freq: Every day | ORAL | 1 refills | Status: DC

## 2019-05-08 MED ORDER — ACETAMINOPHEN-CODEINE #3 300-30 MG PO TABS: 1.0000 | ORAL_TABLET | Freq: Three times a day (TID) | ORAL | 0 refills | Status: DC | PRN

## 2019-06-07 DIAGNOSIS — S8251XA Displaced fracture of medial malleolus of right tibia, initial encounter for closed fracture: Secondary | ICD-10-CM | POA: Diagnosis not present

## 2019-06-07 DIAGNOSIS — S82401A Unspecified fracture of shaft of right fibula, initial encounter for closed fracture: Secondary | ICD-10-CM | POA: Diagnosis not present

## 2019-06-07 DIAGNOSIS — Z888 Allergy status to other drugs, medicaments and biological substances status: Secondary | ICD-10-CM | POA: Diagnosis not present

## 2019-06-07 DIAGNOSIS — S82491A Other fracture of shaft of right fibula, initial encounter for closed fracture: Secondary | ICD-10-CM | POA: Diagnosis not present

## 2019-06-07 DIAGNOSIS — S82831A Other fracture of upper and lower end of right fibula, initial encounter for closed fracture: Secondary | ICD-10-CM | POA: Diagnosis not present

## 2019-06-07 DIAGNOSIS — F329 Major depressive disorder, single episode, unspecified: Secondary | ICD-10-CM | POA: Diagnosis not present

## 2019-06-07 DIAGNOSIS — S82251A Displaced comminuted fracture of shaft of right tibia, initial encounter for closed fracture: Secondary | ICD-10-CM | POA: Diagnosis not present

## 2019-06-07 DIAGNOSIS — Z79899 Other long term (current) drug therapy: Secondary | ICD-10-CM | POA: Diagnosis not present

## 2019-06-07 DIAGNOSIS — S82841A Displaced bimalleolar fracture of right lower leg, initial encounter for closed fracture: Secondary | ICD-10-CM | POA: Diagnosis not present

## 2019-06-07 DIAGNOSIS — G8918 Other acute postprocedural pain: Secondary | ICD-10-CM | POA: Diagnosis not present

## 2019-06-07 DIAGNOSIS — S82201A Unspecified fracture of shaft of right tibia, initial encounter for closed fracture: Secondary | ICD-10-CM | POA: Diagnosis not present

## 2019-06-07 DIAGNOSIS — Z8616 Personal history of COVID-19: Secondary | ICD-10-CM | POA: Diagnosis not present

## 2019-06-07 DIAGNOSIS — M545 Low back pain: Secondary | ICD-10-CM | POA: Diagnosis not present

## 2019-06-07 DIAGNOSIS — M546 Pain in thoracic spine: Secondary | ICD-10-CM | POA: Diagnosis not present

## 2019-06-07 DIAGNOSIS — Z7982 Long term (current) use of aspirin: Secondary | ICD-10-CM | POA: Diagnosis not present

## 2019-06-07 DIAGNOSIS — S82391A Other fracture of lower end of right tibia, initial encounter for closed fracture: Secondary | ICD-10-CM | POA: Diagnosis not present

## 2019-06-13 ENCOUNTER — Other Ambulatory Visit: Payer: Self-pay

## 2019-06-13 NOTE — Patient Outreach (Signed)
[facility]) Care Management  06/13/2019  Ra Pfiester 1970/05/16 [id number]   Referral Date: 06/13/19 Referral Source: [employer] Report Referral Reason: Discharge [facility] 06-11-19-[place] Fracture   Outreach Attempt: Telephone call to patient via pacific interpreters - Ingrid.  Male answered stating patient not available.  CM contact information given for call back.    Plan: RN CM will attempt patient again within 4 business days and send letter.     Bary Leriche, RN, MSN [facility] Care Management Care Management Coordinator Direct Line [phone] Toll Free: [phone]  Fax: [phone]

## 2019-06-14 ENCOUNTER — Other Ambulatory Visit: Payer: Self-pay

## 2019-06-14 NOTE — Patient Outreach (Signed)
[facility]) Care Management  06/14/2019  Joshua [name] 1970-12-09 [id number]   Referral Date: 06/13/19 Referral Source: [employer] Report Referral Reason: Discharge [facility] 06-11-19-[place] Fracture   Outreach Attempt: Telephone call to patient via pacific interpreters - [phone].  No answer HIPAA compliant voice message left.      Plan: RN CM will attempt patient again within 4 business days.  Bary Leriche, RN, MSN [facility] Care Management Care Management Coordinator Direct Line [phone] Cell [phone] Toll Free: [phone]  Fax: [phone]

## 2019-06-15 ENCOUNTER — Other Ambulatory Visit: Payer: Self-pay

## 2019-06-15 ENCOUNTER — Ambulatory Visit: Payer: Self-pay

## 2019-06-15 NOTE — Patient Outreach (Signed)
[facility]) Care Management  06/15/2019  Orlondo [place]cross 1971/03/15 [id number]   Referral Date:06/13/19 Referral Source:[employer] Report Referral Reason:Discharge [facility] 06-11-19-[place]ia Fracture   Outreach Attempt:Telephone call to patient via pacific interpreters - Jari Favre.  No answer HIPAA compliant voice message left.      Plan:RN CM will wait return call.  If no return call will close case.    Bary Leriche, RN, MSN [facility] Care Management Care Management Coordinator Direct Line [phone] Cell [phone] Toll Free: [phone]  Fax: [phone]

## 2019-06-17 DIAGNOSIS — S82209A Unspecified fracture of shaft of unspecified tibia, initial encounter for closed fracture: Secondary | ICD-10-CM | POA: Diagnosis not present

## 2019-06-20 DIAGNOSIS — S82301A Unspecified fracture of lower end of right tibia, initial encounter for closed fracture: Secondary | ICD-10-CM | POA: Diagnosis not present

## 2019-06-20 DIAGNOSIS — S82831A Other fracture of upper and lower end of right fibula, initial encounter for closed fracture: Secondary | ICD-10-CM | POA: Diagnosis not present

## 2019-06-27 ENCOUNTER — Other Ambulatory Visit: Payer: Self-pay

## 2019-06-27 DIAGNOSIS — M25671 Stiffness of right ankle, not elsewhere classified: Secondary | ICD-10-CM | POA: Diagnosis not present

## 2019-06-27 DIAGNOSIS — M6281 Muscle weakness (generalized): Secondary | ICD-10-CM | POA: Diagnosis not present

## 2019-06-27 DIAGNOSIS — M25571 Pain in right ankle and joints of right foot: Secondary | ICD-10-CM | POA: Diagnosis not present

## 2019-06-27 DIAGNOSIS — R2689 Other abnormalities of gait and mobility: Secondary | ICD-10-CM | POA: Diagnosis not present

## 2019-06-27 NOTE — Patient Outreach (Signed)
[facility]) Care Management  06/27/2019  Joshua [name] Jul 23, 1970 [id number]   Multiple attempts to establish contact with patient without success. No response from letter mailed to patient.   Plan: RN CM will close case at this time.   Bary Leriche, RN, MSN [facility] Care Management Care Management Coordinator Direct Line [phone] Cell [phone] Toll Free: [phone]  Fax: [phone]

## 2019-06-29 DIAGNOSIS — M25571 Pain in right ankle and joints of right foot: Secondary | ICD-10-CM | POA: Diagnosis not present

## 2019-06-29 DIAGNOSIS — M25671 Stiffness of right ankle, not elsewhere classified: Secondary | ICD-10-CM | POA: Diagnosis not present

## 2019-06-29 DIAGNOSIS — M6281 Muscle weakness (generalized): Secondary | ICD-10-CM | POA: Diagnosis not present

## 2019-06-29 DIAGNOSIS — R2689 Other abnormalities of gait and mobility: Secondary | ICD-10-CM | POA: Diagnosis not present

## 2019-07-01 DIAGNOSIS — M6281 Muscle weakness (generalized): Secondary | ICD-10-CM | POA: Diagnosis not present

## 2019-07-01 DIAGNOSIS — R2689 Other abnormalities of gait and mobility: Secondary | ICD-10-CM | POA: Diagnosis not present

## 2019-07-01 DIAGNOSIS — M25571 Pain in right ankle and joints of right foot: Secondary | ICD-10-CM | POA: Diagnosis not present

## 2019-07-01 DIAGNOSIS — M25671 Stiffness of right ankle, not elsewhere classified: Secondary | ICD-10-CM | POA: Diagnosis not present

## 2019-07-04 DIAGNOSIS — M25671 Stiffness of right ankle, not elsewhere classified: Secondary | ICD-10-CM | POA: Diagnosis not present

## 2019-07-04 DIAGNOSIS — M25571 Pain in right ankle and joints of right foot: Secondary | ICD-10-CM | POA: Diagnosis not present

## 2019-07-04 DIAGNOSIS — R2689 Other abnormalities of gait and mobility: Secondary | ICD-10-CM | POA: Diagnosis not present

## 2019-07-04 DIAGNOSIS — M6281 Muscle weakness (generalized): Secondary | ICD-10-CM | POA: Diagnosis not present

## 2019-07-05 ENCOUNTER — Ambulatory Visit (INDEPENDENT_AMBULATORY_CARE_PROVIDER_SITE_OTHER): Payer: Medicare HMO | Admitting: Psychiatry

## 2019-07-05 ENCOUNTER — Encounter (HOSPITAL_COMMUNITY): Payer: Self-pay | Admitting: Psychiatry

## 2019-07-05 ENCOUNTER — Other Ambulatory Visit: Payer: Self-pay

## 2019-07-05 DIAGNOSIS — F5101 Primary insomnia: Secondary | ICD-10-CM

## 2019-07-05 DIAGNOSIS — F411 Generalized anxiety disorder: Secondary | ICD-10-CM

## 2019-07-05 DIAGNOSIS — F331 Major depressive disorder, recurrent, moderate: Secondary | ICD-10-CM

## 2019-07-05 MED ORDER — ARIPIPRAZOLE 2 MG PO TABS: 2.0000 mg | ORAL_TABLET | Freq: Every day | ORAL | 0 refills | Status: DC

## 2019-07-05 MED ORDER — TRAZODONE HCL 50 MG PO TABS: 50.0000 mg | ORAL_TABLET | Freq: Every day | ORAL | 0 refills | Status: DC

## 2019-07-05 MED ORDER — NORTRIPTYLINE HCL 75 MG PO CAPS: 75.0000 mg | ORAL_CAPSULE | Freq: Every day | ORAL | 0 refills | Status: DC

## 2019-07-05 MED ORDER — PAROXETINE HCL 40 MG PO TABS: 40.0000 mg | ORAL_TABLET | Freq: Every day | ORAL | 0 refills | Status: DC

## 2019-07-05 NOTE — Progress Notes (Signed)
Virtual Visit via Telephone Note  I connected with Joshua [name] on 07/05/19 at  2:40 PM EDT by telephone and verified that I am speaking with the correct person using two identifiers.   I discussed the limitations, risks, security and privacy concerns of performing an evaluation and management service by telephone and the availability of in person appointments. I also discussed with the patient that there may be a patient responsible charge related to this service. The patient expressed understanding and agreed to proceed.   History of Present Illness: Patient was evaluated by phone session.  Patient does not speak English and information was a obtained from his mother-in-law who speaks [place].  Apparently patient is stable on his medication.  Recently he has given some pain medicine because of chronic pain which has been increased recently.  However he like his current psychotropic medication which is helping his depression and anxiety.  He is sleeping good with trazodone.  We have tried to cut down his Paxil but his depression come back.  He has no tremors, shakes or any EPS.  He denies any crying spells or any feeling of hopelessness or worthlessness.  He lives with his wife and 3 kids.  His wife is very supportive.  He has a good supportive family.  He denies drinking or using any illegal substances.  Like to continue his current psychotropic medication.   Past Psychiatric History: H/O inpatient in [place] in 2008 when walked into traffic. Tried Risperdal, Cymbalta and temazepam with poor outcome. Seeing psychiatrist in our office since October 2018.   Psychiatric Specialty Exam: Physical Exam  Review of Systems  There were no vitals taken for this visit.There is no height or weight on file to calculate BMI.  General Appearance: NA  Eye Contact:  NA  Speech:  Slow  Volume:  Decreased  Mood:  Euthymic  Affect:  NA  Thought Process:  Descriptions of Associations: Intact   Orientation:  Full (Time, Place, and Person)  Thought Content:  WDL  Suicidal Thoughts:  No  Homicidal Thoughts:  No  Memory:  Immediate;   Good Recent;   Fair Remote;   Fair  Judgement:  Intact  Insight:  Present  Psychomotor Activity:  NA  Concentration:  Concentration: Fair and Attention Span: Fair  Recall:  [employer] of Knowledge:  Fair  Language:  speaks spanish  Akathisia:  No  Handed:  Right  AIMS (if indicated):     Assets:  Communication Skills Housing Resilience Social Support  ADL's:  Intact  Cognition:  WNL  Sleep:         Assessment and Plan: Major depressive disorder, recurrent.  Generalized anxiety disorder.  Primary insomnia.  Patient is doing better since we increase the Paxil to his previous dose to 40 mg.  He does not want to change or reduce his medication since he feel it is working.  Continue trazodone 50 mg at bedtime, Paxil 40 mg daily, nortriptyline 75 mg at bedtime and Abilify 2 mg daily.  We discussed multiple psychotropic medication can cause serotonin syndrome but so far patient does not have any tremors or shakes.  We will schedule appointment in 3 months and have a Licensed conveyancer available.  Recommended to call us back if is any question of any concern.  Follow-up in 3 months.  Follow Up Instructions:    I discussed the assessment and treatment plan with the patient. The patient was provided an opportunity to ask questions and all  were answered. The patient agreed with the plan and demonstrated an understanding of the instructions.   The patient was advised to call back or seek an in-person evaluation if the symptoms worsen or if the condition fails to improve as anticipated.  I provided 20 minutes of non-face-to-face time during this encounter.   Kathlee Nations, MD

## 2019-07-06 DIAGNOSIS — M25671 Stiffness of right ankle, not elsewhere classified: Secondary | ICD-10-CM | POA: Diagnosis not present

## 2019-07-06 DIAGNOSIS — M25571 Pain in right ankle and joints of right foot: Secondary | ICD-10-CM | POA: Diagnosis not present

## 2019-07-06 DIAGNOSIS — M6281 Muscle weakness (generalized): Secondary | ICD-10-CM | POA: Diagnosis not present

## 2019-07-06 DIAGNOSIS — R2689 Other abnormalities of gait and mobility: Secondary | ICD-10-CM | POA: Diagnosis not present

## 2019-07-08 DIAGNOSIS — M6281 Muscle weakness (generalized): Secondary | ICD-10-CM | POA: Diagnosis not present

## 2019-07-08 DIAGNOSIS — R2689 Other abnormalities of gait and mobility: Secondary | ICD-10-CM | POA: Diagnosis not present

## 2019-07-08 DIAGNOSIS — M25571 Pain in right ankle and joints of right foot: Secondary | ICD-10-CM | POA: Diagnosis not present

## 2019-07-08 DIAGNOSIS — M25671 Stiffness of right ankle, not elsewhere classified: Secondary | ICD-10-CM | POA: Diagnosis not present

## 2019-07-11 ENCOUNTER — Ambulatory Visit (HOSPITAL_COMMUNITY): Payer: PPO | Admitting: Psychiatry

## 2019-07-11 ENCOUNTER — Other Ambulatory Visit: Payer: Self-pay | Admitting: Nurse Practitioner

## 2019-07-11 DIAGNOSIS — M25671 Stiffness of right ankle, not elsewhere classified: Secondary | ICD-10-CM | POA: Diagnosis not present

## 2019-07-11 DIAGNOSIS — M25571 Pain in right ankle and joints of right foot: Secondary | ICD-10-CM | POA: Diagnosis not present

## 2019-07-11 DIAGNOSIS — M6281 Muscle weakness (generalized): Secondary | ICD-10-CM | POA: Diagnosis not present

## 2019-07-11 DIAGNOSIS — E782 Mixed hyperlipidemia: Secondary | ICD-10-CM

## 2019-07-11 DIAGNOSIS — G8929 Other chronic pain: Secondary | ICD-10-CM

## 2019-07-11 DIAGNOSIS — R2689 Other abnormalities of gait and mobility: Secondary | ICD-10-CM | POA: Diagnosis not present

## 2019-07-11 MED ORDER — ATORVASTATIN CALCIUM 40 MG PO TABS: 40.0000 mg | ORAL_TABLET | Freq: Every day | ORAL | 0 refills | Status: DC

## 2019-07-11 MED ORDER — NAPROXEN 500 MG PO TABS: 500.0000 mg | ORAL_TABLET | Freq: Every day | ORAL | 0 refills | Status: DC | PRN

## 2019-07-11 MED ORDER — ACETAMINOPHEN-CODEINE #3 300-30 MG PO TABS: 1.0000 | ORAL_TABLET | Freq: Three times a day (TID) | ORAL | 0 refills | Status: DC | PRN

## 2019-07-13 ENCOUNTER — Other Ambulatory Visit: Payer: Self-pay | Admitting: Nurse Practitioner

## 2019-07-13 ENCOUNTER — Other Ambulatory Visit (HOSPITAL_COMMUNITY): Payer: Self-pay

## 2019-07-13 DIAGNOSIS — F411 Generalized anxiety disorder: Secondary | ICD-10-CM

## 2019-07-13 DIAGNOSIS — F331 Major depressive disorder, recurrent, moderate: Secondary | ICD-10-CM

## 2019-07-13 DIAGNOSIS — R262 Difficulty in walking, not elsewhere classified: Secondary | ICD-10-CM | POA: Diagnosis not present

## 2019-07-13 DIAGNOSIS — G8929 Other chronic pain: Secondary | ICD-10-CM

## 2019-07-13 DIAGNOSIS — F5101 Primary insomnia: Secondary | ICD-10-CM

## 2019-07-13 DIAGNOSIS — M25671 Stiffness of right ankle, not elsewhere classified: Secondary | ICD-10-CM | POA: Diagnosis not present

## 2019-07-13 DIAGNOSIS — L89609 Pressure ulcer of unspecified heel, unspecified stage: Secondary | ICD-10-CM | POA: Diagnosis not present

## 2019-07-13 DIAGNOSIS — M25571 Pain in right ankle and joints of right foot: Secondary | ICD-10-CM | POA: Diagnosis not present

## 2019-07-13 MED ORDER — NORTRIPTYLINE HCL 75 MG PO CAPS: 75.0000 mg | ORAL_CAPSULE | Freq: Every day | ORAL | 0 refills | Status: DC

## 2019-07-13 MED ORDER — TRAZODONE HCL 50 MG PO TABS: 50.0000 mg | ORAL_TABLET | Freq: Every day | ORAL | 0 refills | Status: DC

## 2019-07-13 MED ORDER — ACETAMINOPHEN-CODEINE #3 300-30 MG PO TABS: 1.0000 | ORAL_TABLET | Freq: Three times a day (TID) | ORAL | 0 refills | Status: DC | PRN

## 2019-07-14 DIAGNOSIS — S82831A Other fracture of upper and lower end of right fibula, initial encounter for closed fracture: Secondary | ICD-10-CM | POA: Diagnosis not present

## 2019-07-14 DIAGNOSIS — S82301A Unspecified fracture of lower end of right tibia, initial encounter for closed fracture: Secondary | ICD-10-CM | POA: Diagnosis not present

## 2019-07-18 DIAGNOSIS — R262 Difficulty in walking, not elsewhere classified: Secondary | ICD-10-CM | POA: Diagnosis not present

## 2019-07-18 DIAGNOSIS — L89609 Pressure ulcer of unspecified heel, unspecified stage: Secondary | ICD-10-CM | POA: Diagnosis not present

## 2019-07-18 DIAGNOSIS — S82301D Unspecified fracture of lower end of right tibia, subsequent encounter for closed fracture with routine healing: Secondary | ICD-10-CM | POA: Diagnosis not present

## 2019-07-18 DIAGNOSIS — M25671 Stiffness of right ankle, not elsewhere classified: Secondary | ICD-10-CM | POA: Diagnosis not present

## 2019-07-18 DIAGNOSIS — M25571 Pain in right ankle and joints of right foot: Secondary | ICD-10-CM | POA: Diagnosis not present

## 2019-07-21 DIAGNOSIS — R262 Difficulty in walking, not elsewhere classified: Secondary | ICD-10-CM | POA: Diagnosis not present

## 2019-07-21 DIAGNOSIS — M25671 Stiffness of right ankle, not elsewhere classified: Secondary | ICD-10-CM | POA: Diagnosis not present

## 2019-07-21 DIAGNOSIS — L89609 Pressure ulcer of unspecified heel, unspecified stage: Secondary | ICD-10-CM | POA: Diagnosis not present

## 2019-07-21 DIAGNOSIS — S82301D Unspecified fracture of lower end of right tibia, subsequent encounter for closed fracture with routine healing: Secondary | ICD-10-CM | POA: Diagnosis not present

## 2019-07-21 DIAGNOSIS — M25571 Pain in right ankle and joints of right foot: Secondary | ICD-10-CM | POA: Diagnosis not present

## 2019-07-25 DIAGNOSIS — R262 Difficulty in walking, not elsewhere classified: Secondary | ICD-10-CM | POA: Diagnosis not present

## 2019-07-25 DIAGNOSIS — S82301D Unspecified fracture of lower end of right tibia, subsequent encounter for closed fracture with routine healing: Secondary | ICD-10-CM | POA: Diagnosis not present

## 2019-07-25 DIAGNOSIS — M25571 Pain in right ankle and joints of right foot: Secondary | ICD-10-CM | POA: Diagnosis not present

## 2019-07-25 DIAGNOSIS — L89609 Pressure ulcer of unspecified heel, unspecified stage: Secondary | ICD-10-CM | POA: Diagnosis not present

## 2019-07-25 DIAGNOSIS — M25671 Stiffness of right ankle, not elsewhere classified: Secondary | ICD-10-CM | POA: Diagnosis not present

## 2019-07-27 DIAGNOSIS — M25671 Stiffness of right ankle, not elsewhere classified: Secondary | ICD-10-CM | POA: Diagnosis not present

## 2019-07-27 DIAGNOSIS — R262 Difficulty in walking, not elsewhere classified: Secondary | ICD-10-CM | POA: Diagnosis not present

## 2019-07-27 DIAGNOSIS — L89609 Pressure ulcer of unspecified heel, unspecified stage: Secondary | ICD-10-CM | POA: Diagnosis not present

## 2019-07-27 DIAGNOSIS — S82301D Unspecified fracture of lower end of right tibia, subsequent encounter for closed fracture with routine healing: Secondary | ICD-10-CM | POA: Diagnosis not present

## 2019-07-27 DIAGNOSIS — M25571 Pain in right ankle and joints of right foot: Secondary | ICD-10-CM | POA: Diagnosis not present

## 2019-08-01 DIAGNOSIS — R262 Difficulty in walking, not elsewhere classified: Secondary | ICD-10-CM | POA: Diagnosis not present

## 2019-08-01 DIAGNOSIS — L89609 Pressure ulcer of unspecified heel, unspecified stage: Secondary | ICD-10-CM | POA: Diagnosis not present

## 2019-08-01 DIAGNOSIS — S82301D Unspecified fracture of lower end of right tibia, subsequent encounter for closed fracture with routine healing: Secondary | ICD-10-CM | POA: Diagnosis not present

## 2019-08-01 DIAGNOSIS — M25671 Stiffness of right ankle, not elsewhere classified: Secondary | ICD-10-CM | POA: Diagnosis not present

## 2019-08-01 DIAGNOSIS — M25571 Pain in right ankle and joints of right foot: Secondary | ICD-10-CM | POA: Diagnosis not present

## 2019-08-03 DIAGNOSIS — M25671 Stiffness of right ankle, not elsewhere classified: Secondary | ICD-10-CM | POA: Diagnosis not present

## 2019-08-03 DIAGNOSIS — R262 Difficulty in walking, not elsewhere classified: Secondary | ICD-10-CM | POA: Diagnosis not present

## 2019-08-03 DIAGNOSIS — S82301D Unspecified fracture of lower end of right tibia, subsequent encounter for closed fracture with routine healing: Secondary | ICD-10-CM | POA: Diagnosis not present

## 2019-08-03 DIAGNOSIS — L89609 Pressure ulcer of unspecified heel, unspecified stage: Secondary | ICD-10-CM | POA: Diagnosis not present

## 2019-08-03 DIAGNOSIS — M25571 Pain in right ankle and joints of right foot: Secondary | ICD-10-CM | POA: Diagnosis not present

## 2019-08-04 DIAGNOSIS — S82301A Unspecified fracture of lower end of right tibia, initial encounter for closed fracture: Secondary | ICD-10-CM | POA: Diagnosis not present

## 2019-08-04 DIAGNOSIS — S82831A Other fracture of upper and lower end of right fibula, initial encounter for closed fracture: Secondary | ICD-10-CM | POA: Diagnosis not present

## 2019-08-08 DIAGNOSIS — M25671 Stiffness of right ankle, not elsewhere classified: Secondary | ICD-10-CM | POA: Diagnosis not present

## 2019-08-08 DIAGNOSIS — S82301D Unspecified fracture of lower end of right tibia, subsequent encounter for closed fracture with routine healing: Secondary | ICD-10-CM | POA: Diagnosis not present

## 2019-08-08 DIAGNOSIS — L89609 Pressure ulcer of unspecified heel, unspecified stage: Secondary | ICD-10-CM | POA: Diagnosis not present

## 2019-08-08 DIAGNOSIS — M25571 Pain in right ankle and joints of right foot: Secondary | ICD-10-CM | POA: Diagnosis not present

## 2019-08-08 DIAGNOSIS — R262 Difficulty in walking, not elsewhere classified: Secondary | ICD-10-CM | POA: Diagnosis not present

## 2019-08-11 DIAGNOSIS — L89609 Pressure ulcer of unspecified heel, unspecified stage: Secondary | ICD-10-CM | POA: Diagnosis not present

## 2019-08-11 DIAGNOSIS — M25671 Stiffness of right ankle, not elsewhere classified: Secondary | ICD-10-CM | POA: Diagnosis not present

## 2019-08-11 DIAGNOSIS — M25571 Pain in right ankle and joints of right foot: Secondary | ICD-10-CM | POA: Diagnosis not present

## 2019-08-11 DIAGNOSIS — S82301D Unspecified fracture of lower end of right tibia, subsequent encounter for closed fracture with routine healing: Secondary | ICD-10-CM | POA: Diagnosis not present

## 2019-08-11 DIAGNOSIS — R262 Difficulty in walking, not elsewhere classified: Secondary | ICD-10-CM | POA: Diagnosis not present

## 2019-08-16 DIAGNOSIS — L89609 Pressure ulcer of unspecified heel, unspecified stage: Secondary | ICD-10-CM | POA: Diagnosis not present

## 2019-08-16 DIAGNOSIS — S82301D Unspecified fracture of lower end of right tibia, subsequent encounter for closed fracture with routine healing: Secondary | ICD-10-CM | POA: Diagnosis not present

## 2019-08-16 DIAGNOSIS — M25671 Stiffness of right ankle, not elsewhere classified: Secondary | ICD-10-CM | POA: Diagnosis not present

## 2019-08-16 DIAGNOSIS — M25571 Pain in right ankle and joints of right foot: Secondary | ICD-10-CM | POA: Diagnosis not present

## 2019-08-16 DIAGNOSIS — R262 Difficulty in walking, not elsewhere classified: Secondary | ICD-10-CM | POA: Diagnosis not present

## 2019-08-18 DIAGNOSIS — M25571 Pain in right ankle and joints of right foot: Secondary | ICD-10-CM | POA: Diagnosis not present

## 2019-08-18 DIAGNOSIS — M25671 Stiffness of right ankle, not elsewhere classified: Secondary | ICD-10-CM | POA: Diagnosis not present

## 2019-08-18 DIAGNOSIS — R262 Difficulty in walking, not elsewhere classified: Secondary | ICD-10-CM | POA: Diagnosis not present

## 2019-08-18 DIAGNOSIS — L89609 Pressure ulcer of unspecified heel, unspecified stage: Secondary | ICD-10-CM | POA: Diagnosis not present

## 2019-08-18 DIAGNOSIS — S82301D Unspecified fracture of lower end of right tibia, subsequent encounter for closed fracture with routine healing: Secondary | ICD-10-CM | POA: Diagnosis not present

## 2019-08-23 DIAGNOSIS — L89609 Pressure ulcer of unspecified heel, unspecified stage: Secondary | ICD-10-CM | POA: Diagnosis not present

## 2019-08-23 DIAGNOSIS — M25571 Pain in right ankle and joints of right foot: Secondary | ICD-10-CM | POA: Diagnosis not present

## 2019-08-23 DIAGNOSIS — R262 Difficulty in walking, not elsewhere classified: Secondary | ICD-10-CM | POA: Diagnosis not present

## 2019-08-23 DIAGNOSIS — S82301D Unspecified fracture of lower end of right tibia, subsequent encounter for closed fracture with routine healing: Secondary | ICD-10-CM | POA: Diagnosis not present

## 2019-08-23 DIAGNOSIS — M25671 Stiffness of right ankle, not elsewhere classified: Secondary | ICD-10-CM | POA: Diagnosis not present

## 2019-08-25 DIAGNOSIS — R262 Difficulty in walking, not elsewhere classified: Secondary | ICD-10-CM | POA: Diagnosis not present

## 2019-08-25 DIAGNOSIS — M25671 Stiffness of right ankle, not elsewhere classified: Secondary | ICD-10-CM | POA: Diagnosis not present

## 2019-08-25 DIAGNOSIS — S82301D Unspecified fracture of lower end of right tibia, subsequent encounter for closed fracture with routine healing: Secondary | ICD-10-CM | POA: Diagnosis not present

## 2019-08-25 DIAGNOSIS — M25571 Pain in right ankle and joints of right foot: Secondary | ICD-10-CM | POA: Diagnosis not present

## 2019-08-25 DIAGNOSIS — L89609 Pressure ulcer of unspecified heel, unspecified stage: Secondary | ICD-10-CM | POA: Diagnosis not present

## 2019-08-30 DIAGNOSIS — L89609 Pressure ulcer of unspecified heel, unspecified stage: Secondary | ICD-10-CM | POA: Diagnosis not present

## 2019-08-30 DIAGNOSIS — M25571 Pain in right ankle and joints of right foot: Secondary | ICD-10-CM | POA: Diagnosis not present

## 2019-08-30 DIAGNOSIS — S82301D Unspecified fracture of lower end of right tibia, subsequent encounter for closed fracture with routine healing: Secondary | ICD-10-CM | POA: Diagnosis not present

## 2019-08-30 DIAGNOSIS — M25671 Stiffness of right ankle, not elsewhere classified: Secondary | ICD-10-CM | POA: Diagnosis not present

## 2019-08-30 DIAGNOSIS — R262 Difficulty in walking, not elsewhere classified: Secondary | ICD-10-CM | POA: Diagnosis not present

## 2019-09-01 DIAGNOSIS — S82301D Unspecified fracture of lower end of right tibia, subsequent encounter for closed fracture with routine healing: Secondary | ICD-10-CM | POA: Diagnosis not present

## 2019-09-01 DIAGNOSIS — M25671 Stiffness of right ankle, not elsewhere classified: Secondary | ICD-10-CM | POA: Diagnosis not present

## 2019-09-01 DIAGNOSIS — M25571 Pain in right ankle and joints of right foot: Secondary | ICD-10-CM | POA: Diagnosis not present

## 2019-09-01 DIAGNOSIS — R262 Difficulty in walking, not elsewhere classified: Secondary | ICD-10-CM | POA: Diagnosis not present

## 2019-09-01 DIAGNOSIS — L89609 Pressure ulcer of unspecified heel, unspecified stage: Secondary | ICD-10-CM | POA: Diagnosis not present

## 2019-09-08 DIAGNOSIS — S82401K Unspecified fracture of shaft of right fibula, subsequent encounter for closed fracture with nonunion: Secondary | ICD-10-CM | POA: Diagnosis not present

## 2019-09-08 DIAGNOSIS — S82831A Other fracture of upper and lower end of right fibula, initial encounter for closed fracture: Secondary | ICD-10-CM | POA: Diagnosis not present

## 2019-09-08 DIAGNOSIS — S82301K Unspecified fracture of lower end of right tibia, subsequent encounter for closed fracture with nonunion: Secondary | ICD-10-CM | POA: Diagnosis not present

## 2019-09-15 DIAGNOSIS — M25571 Pain in right ankle and joints of right foot: Secondary | ICD-10-CM | POA: Diagnosis not present

## 2019-09-15 DIAGNOSIS — S82301D Unspecified fracture of lower end of right tibia, subsequent encounter for closed fracture with routine healing: Secondary | ICD-10-CM | POA: Diagnosis not present

## 2019-09-15 DIAGNOSIS — M25671 Stiffness of right ankle, not elsewhere classified: Secondary | ICD-10-CM | POA: Diagnosis not present

## 2019-09-15 DIAGNOSIS — R262 Difficulty in walking, not elsewhere classified: Secondary | ICD-10-CM | POA: Diagnosis not present

## 2019-09-15 DIAGNOSIS — L89609 Pressure ulcer of unspecified heel, unspecified stage: Secondary | ICD-10-CM | POA: Diagnosis not present

## 2019-09-22 DIAGNOSIS — M25571 Pain in right ankle and joints of right foot: Secondary | ICD-10-CM | POA: Diagnosis not present

## 2019-09-22 DIAGNOSIS — L89609 Pressure ulcer of unspecified heel, unspecified stage: Secondary | ICD-10-CM | POA: Diagnosis not present

## 2019-09-22 DIAGNOSIS — M25671 Stiffness of right ankle, not elsewhere classified: Secondary | ICD-10-CM | POA: Diagnosis not present

## 2019-09-22 DIAGNOSIS — R262 Difficulty in walking, not elsewhere classified: Secondary | ICD-10-CM | POA: Diagnosis not present

## 2019-09-22 DIAGNOSIS — S82301D Unspecified fracture of lower end of right tibia, subsequent encounter for closed fracture with routine healing: Secondary | ICD-10-CM | POA: Diagnosis not present

## 2019-09-27 DIAGNOSIS — R262 Difficulty in walking, not elsewhere classified: Secondary | ICD-10-CM | POA: Diagnosis not present

## 2019-09-27 DIAGNOSIS — S82301D Unspecified fracture of lower end of right tibia, subsequent encounter for closed fracture with routine healing: Secondary | ICD-10-CM | POA: Diagnosis not present

## 2019-09-27 DIAGNOSIS — M25571 Pain in right ankle and joints of right foot: Secondary | ICD-10-CM | POA: Diagnosis not present

## 2019-09-27 DIAGNOSIS — M25671 Stiffness of right ankle, not elsewhere classified: Secondary | ICD-10-CM | POA: Diagnosis not present

## 2019-09-27 DIAGNOSIS — L89609 Pressure ulcer of unspecified heel, unspecified stage: Secondary | ICD-10-CM | POA: Diagnosis not present

## 2019-09-29 DIAGNOSIS — S82301D Unspecified fracture of lower end of right tibia, subsequent encounter for closed fracture with routine healing: Secondary | ICD-10-CM | POA: Diagnosis not present

## 2019-09-29 DIAGNOSIS — M25671 Stiffness of right ankle, not elsewhere classified: Secondary | ICD-10-CM | POA: Diagnosis not present

## 2019-09-29 DIAGNOSIS — M25571 Pain in right ankle and joints of right foot: Secondary | ICD-10-CM | POA: Diagnosis not present

## 2019-09-29 DIAGNOSIS — L89609 Pressure ulcer of unspecified heel, unspecified stage: Secondary | ICD-10-CM | POA: Diagnosis not present

## 2019-09-29 DIAGNOSIS — R262 Difficulty in walking, not elsewhere classified: Secondary | ICD-10-CM | POA: Diagnosis not present

## 2019-10-04 DIAGNOSIS — L89609 Pressure ulcer of unspecified heel, unspecified stage: Secondary | ICD-10-CM | POA: Diagnosis not present

## 2019-10-04 DIAGNOSIS — R262 Difficulty in walking, not elsewhere classified: Secondary | ICD-10-CM | POA: Diagnosis not present

## 2019-10-04 DIAGNOSIS — S82301D Unspecified fracture of lower end of right tibia, subsequent encounter for closed fracture with routine healing: Secondary | ICD-10-CM | POA: Diagnosis not present

## 2019-10-04 DIAGNOSIS — M25671 Stiffness of right ankle, not elsewhere classified: Secondary | ICD-10-CM | POA: Diagnosis not present

## 2019-10-04 DIAGNOSIS — M25571 Pain in right ankle and joints of right foot: Secondary | ICD-10-CM | POA: Diagnosis not present

## 2019-10-05 ENCOUNTER — Encounter (HOSPITAL_COMMUNITY): Payer: Self-pay | Admitting: Psychiatry

## 2019-10-05 ENCOUNTER — Other Ambulatory Visit: Payer: Self-pay

## 2019-10-05 ENCOUNTER — Telehealth (INDEPENDENT_AMBULATORY_CARE_PROVIDER_SITE_OTHER): Payer: Medicare HMO | Admitting: Psychiatry

## 2019-10-05 DIAGNOSIS — F411 Generalized anxiety disorder: Secondary | ICD-10-CM

## 2019-10-05 DIAGNOSIS — F331 Major depressive disorder, recurrent, moderate: Secondary | ICD-10-CM | POA: Diagnosis not present

## 2019-10-05 DIAGNOSIS — F5101 Primary insomnia: Secondary | ICD-10-CM | POA: Diagnosis not present

## 2019-10-05 MED ORDER — NORTRIPTYLINE HCL 75 MG PO CAPS: 75.0000 mg | ORAL_CAPSULE | Freq: Every day | ORAL | 0 refills | Status: DC

## 2019-10-05 MED ORDER — TRAZODONE HCL 50 MG PO TABS: 50.0000 mg | ORAL_TABLET | Freq: Every day | ORAL | 0 refills | Status: DC

## 2019-10-05 MED ORDER — PAROXETINE HCL 40 MG PO TABS: 40.0000 mg | ORAL_TABLET | Freq: Every day | ORAL | 0 refills | Status: DC

## 2019-10-05 MED ORDER — ARIPIPRAZOLE 2 MG PO TABS: 2.0000 mg | ORAL_TABLET | Freq: Every day | ORAL | 0 refills | Status: DC

## 2019-10-05 NOTE — Progress Notes (Signed)
Virtual Visit via Video Note  I connected with Joshua [name] on 10/05/19 at  2:00 PM EDT by a video enabled telemedicine application and verified that I am speaking with the correct person using two identifiers.  Location: Patient: home Provider: home office   I discussed the limitations of evaluation and management by telemedicine and the availability of in person appointments. The patient expressed understanding and agreed to proceed.  History of Present Illness: Patient is evaluated by phone session.  Patient does not speak English and his son Joshua [name] helped translation.  Patient is sleeping good.  He is taking trazodone and Pamelor but is helping his sleep.  He denies any panic attack, nervousness, anxiety or any crying spells.  His appetite is okay.  Denies any suicidal thoughts.  He is taking multiple medication but he feel they are working well.  He is no longer taking any narcotic pain medicine.  He denies drinking or using any illegal substances.  Denies any EPS, tremors or shakes.  Lives with his wife and 3 kids.  He had a good support from his family.  He like to keep his current medication.   Past Psychiatric History: H/[place] inpatient in [place] in 2008 when walkedinto traffic. TriedRisperdal, Cymbalta and temazepam with poor outcome. Seeingpsychiatrist inouroffice since October 2018.   Psychiatric Specialty Exam: Physical Exam  Review of Systems  Weight 169 lb (76.7 kg).There is no height or weight on file to calculate BMI.  General Appearance: NA  Eye Contact:  NA  Speech:  Slow  Volume:  Normal  Mood:  Euthymic  Affect:  NA  Thought Process:  Goal Directed  Orientation:  Full (Time, Place, and Person)  Thought Content:  Logical  Suicidal Thoughts:  No  Homicidal Thoughts:  No  Memory:  Immediate;   Good Recent;   Fair Remote;   Fair  Judgement:  Intact  Insight:  Present  Psychomotor Activity:  NA  Concentration:  Concentration: Fair and Attention Span:  Fair  Recall:  [employer] of Knowledge:  Fair  Language:  speaks spanish  Akathisia:  No  Handed:  Right  AIMS (if indicated):     Assets:  Desire for Improvement Housing Resilience Social Support  ADL's:  Intact  Cognition:  WNL  Sleep:   good      Assessment and Plan: Major depressive disorder, recurrent.  Generalized anxiety disorder.  Primary insomnia.  Patient is a stable on his current medication.  We tried to cut down the Paxil dose but his depression got worse.  He does not want to change the dose of his medication.  Continue Paxil 40 mg daily, nortriptyline 75 mg at bedtime, Abilify 2 mg daily and trazodone 50 mg at bedtime.  Patient does not have any tremors or shakes and he is aware about serotonin syndrome and at this time he does not have any concerns or side effects.  Discussed medication side effects and benefits.  Recommended to call us back if he has any question or any concern.  We will schedule appointment in 3 months and have Spanish-speaking translator available.  Follow Up Instructions:    I discussed the assessment and treatment plan with the patient. The patient was provided an opportunity to ask questions and all were answered. The patient agreed with the plan and demonstrated an understanding of the instructions.   The patient was advised to call back or seek an in-person evaluation if the symptoms worsen or if the condition fails  to improve as anticipated.  I provided 15 minutes of non-face-to-face time during this encounter.   Kathlee Nations, MD

## 2019-10-06 DIAGNOSIS — L89609 Pressure ulcer of unspecified heel, unspecified stage: Secondary | ICD-10-CM | POA: Diagnosis not present

## 2019-10-06 DIAGNOSIS — M25671 Stiffness of right ankle, not elsewhere classified: Secondary | ICD-10-CM | POA: Diagnosis not present

## 2019-10-06 DIAGNOSIS — S82301D Unspecified fracture of lower end of right tibia, subsequent encounter for closed fracture with routine healing: Secondary | ICD-10-CM | POA: Diagnosis not present

## 2019-10-06 DIAGNOSIS — M25571 Pain in right ankle and joints of right foot: Secondary | ICD-10-CM | POA: Diagnosis not present

## 2019-10-06 DIAGNOSIS — R262 Difficulty in walking, not elsewhere classified: Secondary | ICD-10-CM | POA: Diagnosis not present

## 2019-10-10 ENCOUNTER — Telehealth: Payer: Self-pay | Admitting: Nurse Practitioner

## 2019-10-10 NOTE — Telephone Encounter (Signed)
[facility] called requesting med refill for the follow   Tylenol 3  Omeprazole naproxen

## 2019-10-10 NOTE — Telephone Encounter (Signed)
Patient has not been seen recently. Will send request to PCP.

## 2019-10-11 DIAGNOSIS — L89609 Pressure ulcer of unspecified heel, unspecified stage: Secondary | ICD-10-CM | POA: Diagnosis not present

## 2019-10-11 DIAGNOSIS — R262 Difficulty in walking, not elsewhere classified: Secondary | ICD-10-CM | POA: Diagnosis not present

## 2019-10-11 DIAGNOSIS — M25571 Pain in right ankle and joints of right foot: Secondary | ICD-10-CM | POA: Diagnosis not present

## 2019-10-11 DIAGNOSIS — M25671 Stiffness of right ankle, not elsewhere classified: Secondary | ICD-10-CM | POA: Diagnosis not present

## 2019-10-11 DIAGNOSIS — S82301D Unspecified fracture of lower end of right tibia, subsequent encounter for closed fracture with routine healing: Secondary | ICD-10-CM | POA: Diagnosis not present

## 2019-10-12 ENCOUNTER — Other Ambulatory Visit: Payer: Self-pay | Admitting: Nurse Practitioner

## 2019-10-12 DIAGNOSIS — G8929 Other chronic pain: Secondary | ICD-10-CM

## 2019-10-12 NOTE — Telephone Encounter (Signed)
DENIED. NEEDS OFFICE VISIT

## 2019-10-12 NOTE — Telephone Encounter (Signed)
Requested medication (s) are due for refill today: no  Requested medication (s) are on the active medication list: no  Last refill:07/13/19  Future visit scheduled: yes  Notes to clinic:  not delegated; no valid visit in last 6 months     Requested Prescriptions  Pending Prescriptions Disp Refills   acetaminophen-codeine (TYLENOL #3) 300-30 MG tablet [Pharmacy Med Name: Acetaminophen-Codeine #3 300-30 MG Oral Tablet] 60 tablet 0    Sig: TAKE 1 TO 2 TABLETS BY MOUTH EVERY 8 HOURS AS NEEDED FOR PAIN. MUST CALL TO MAKE OFFICE VISIT BEFORE ANY MORE REFILLS.      Not Delegated - Analgesics:  Opioid Agonist Combinations Failed - 10/12/2019  6:10 PM      Failed - This refill cannot be delegated      Failed - Urine Drug Screen completed in last 360 days.      Failed - Valid encounter within last 6 months    Recent Outpatient Visits           1 year ago Chronic bilateral low back pain with right-sided sciatica   [facility] [place], [place] W, NP   1 year ago Chronic bilateral low back pain with right-sided sciatica   [facility] [place], Shea Stakes, NP   1 year ago Chronic bilateral low back pain with right-sided sciatica   [facility] [place], Shea Stakes, NP   2 years ago Mixed hyperlipidemia   [facility] [place], [place] W, NP   2 years ago Chronic bilateral low back pain with right-sided sciatica   [facility] [place], Marzella Schlein, [place]       Future Appointments             In 1 week Claiborne Rigg, NP [employer] [facility]

## 2019-10-13 DIAGNOSIS — S82301K Unspecified fracture of lower end of right tibia, subsequent encounter for closed fracture with nonunion: Secondary | ICD-10-CM | POA: Diagnosis not present

## 2019-10-13 DIAGNOSIS — S82401K Unspecified fracture of shaft of right fibula, subsequent encounter for closed fracture with nonunion: Secondary | ICD-10-CM | POA: Diagnosis not present

## 2019-10-13 NOTE — Telephone Encounter (Signed)
Pt. Have an appt. With PCP on 07.07.2021

## 2019-10-18 DIAGNOSIS — M25671 Stiffness of right ankle, not elsewhere classified: Secondary | ICD-10-CM | POA: Diagnosis not present

## 2019-10-18 DIAGNOSIS — S82301D Unspecified fracture of lower end of right tibia, subsequent encounter for closed fracture with routine healing: Secondary | ICD-10-CM | POA: Diagnosis not present

## 2019-10-18 DIAGNOSIS — R262 Difficulty in walking, not elsewhere classified: Secondary | ICD-10-CM | POA: Diagnosis not present

## 2019-10-18 DIAGNOSIS — M25571 Pain in right ankle and joints of right foot: Secondary | ICD-10-CM | POA: Diagnosis not present

## 2019-10-18 DIAGNOSIS — L89609 Pressure ulcer of unspecified heel, unspecified stage: Secondary | ICD-10-CM | POA: Diagnosis not present

## 2019-10-19 ENCOUNTER — Encounter: Payer: Self-pay | Admitting: Nurse Practitioner

## 2019-10-19 ENCOUNTER — Other Ambulatory Visit: Payer: Self-pay

## 2019-10-19 ENCOUNTER — Telehealth: Payer: Self-pay | Admitting: Nurse Practitioner

## 2019-10-19 ENCOUNTER — Ambulatory Visit: Payer: Medicare HMO | Attending: Nurse Practitioner | Admitting: Nurse Practitioner

## 2019-10-19 VITALS — BP 119/92 | HR 102 | Temp 97.7°F | Ht 68.0 in | Wt 185.0 lb

## 2019-10-19 DIAGNOSIS — E782 Mixed hyperlipidemia: Secondary | ICD-10-CM | POA: Diagnosis not present

## 2019-10-19 DIAGNOSIS — Z114 Encounter for screening for human immunodeficiency virus [HIV]: Secondary | ICD-10-CM

## 2019-10-19 DIAGNOSIS — K219 Gastro-esophageal reflux disease without esophagitis: Secondary | ICD-10-CM | POA: Diagnosis not present

## 2019-10-19 DIAGNOSIS — M5441 Lumbago with sciatica, right side: Secondary | ICD-10-CM | POA: Diagnosis not present

## 2019-10-19 DIAGNOSIS — Z0283 Encounter for blood-alcohol and blood-drug test: Secondary | ICD-10-CM

## 2019-10-19 DIAGNOSIS — R0981 Nasal congestion: Secondary | ICD-10-CM | POA: Diagnosis not present

## 2019-10-19 DIAGNOSIS — G8929 Other chronic pain: Secondary | ICD-10-CM | POA: Diagnosis not present

## 2019-10-19 MED ORDER — ATORVASTATIN CALCIUM 40 MG PO TABS: 40.0000 mg | ORAL_TABLET | Freq: Every day | ORAL | 1 refills | Status: DC

## 2019-10-19 MED ORDER — OMEPRAZOLE 40 MG PO CPDR: 40.0000 mg | DELAYED_RELEASE_CAPSULE | Freq: Every day | ORAL | 1 refills | Status: DC

## 2019-10-19 MED ORDER — ACETAMINOPHEN-CODEINE #3 300-30 MG PO TABS: 1.0000 | ORAL_TABLET | Freq: Three times a day (TID) | ORAL | 0 refills | Status: DC | PRN

## 2019-10-19 MED ORDER — FLUTICASONE PROPIONATE 50 MCG/ACT NA SUSP: 1.0000 | Freq: Every day | NASAL | 1 refills | Status: DC

## 2019-10-19 NOTE — Progress Notes (Signed)
Assessment & Plan:  Joshua [name] was seen today for follow-up.  Diagnoses and all orders for this visit:  Chronic bilateral low back pain with right-sided sciatica -     Drug Screen 12+Alcohol+CRT, Ur -     acetaminophen-codeine (TYLENOL #3) 300-30 MG tablet; Take 1-2 tablets by mouth every 8 (eight) hours as needed for moderate pain. -     Ambulatory referral to Physical Medicine Rehab Work on losing weight to help reduce back pain. May alternate with heat and ice application for pain relief.  Other alternatives include massage, acupuncture and water aerobics.  You must stay active and avoid a sedentary lifestyle.   Mixed hyperlipidemia -     atorvastatin (LIPITOR) 40 MG tablet; Take 1 tablet (40 mg total) by mouth daily. Office visit needed -     Lipid panel INSTRUCTIONS: Work on a low fat, heart healthy diet and participate in regular aerobic exercise program by working out at least 150 minutes per week; 5 days a week-30 minutes per day. Avoid red meat/beef/steak,  fried foods. junk foods, sodas, sugary drinks, unhealthy snacking, alcohol and smoking.  Drink at least 80 oz of water per day and monitor your carbohydrate intake daily.    Nasal congestion -     fluticasone (FLONASE) 50 MCG/ACT nasal spray; Place 1-2 sprays into both nostrils daily.  GERD without esophagitis -     omeprazole (PRILOSEC) 40 MG capsule; Take 1 capsule (40 mg total) by mouth daily. -     CBC -     CMP14+EGFR INSTRUCTIONS: Avoid GERD Triggers: acidic, spicy or fried foods, caffeine, coffee, sodas,  alcohol and chocolate.   Encounter for screening for HIV -     HIV antibody (with reflex)  Encounter for drug screening -     Drug Screen 12+Alcohol+CRT, Ur    Patient has been counseled on age-appropriate routine health concerns for screening and prevention. These are reviewed and up-to-date. Referrals have been placed accordingly. Immunizations are up-to-date or declined.    Subjective:   Chief Complaint   Patient presents with  . Follow-up    Pt. is here for back pain follow up.  Pt. stated he's been having stomach pain and diarhhea.    HPI Joshua [name] 49 y.o. male presents to office today for follow up to his chronic back pain.    States he experienced abdominal pain last Thursday with an onset of diarrhea within 24 hours. Diarrhea persisted through Monday and for the past 2 days he has not experienced any watery stools or abdominal pain. He does note that he was eating an increased amount of spicy foods last week which is not his typical dietary intake. Associated symptoms: one episode of vomiting which has resolved.   He has chronic back pain with right sided sciatica >10 years. Has seen Dr. Letta Pate with PMR a few years ago and would like to be evaluated again for possible spinal injections.  Per Dr. Dianna Limbo note in 2019: We discussed treatment options, he is already failed medication management as well as physical therapy.  He is tried what sounds like a blind epidural injections which have a high rate of failure.  I think is reasonable to do fluoroscopic guided L5-S1 transforaminal injection.  We discussed that he may need up to 3 injections.  If he fails this therapy I would recommend referral to orthopedic surgery to evaluate for laminectomy/discectomy   He is currently working with physical therapy as he sustained the following a few  months ago and was treated at [facility]:  Pressure ulcer of right heel resultant from:  Displaced comminuted fracture of shaft of right tibia,   Displaced fracture of medial malleolus of right tibia,  Other fracture of shaft of right fibula   Review of Systems  Constitutional: Negative for fever, malaise/fatigue and weight loss.  HENT: Positive for congestion (chronic). Negative for nosebleeds.   Eyes: Negative.  Negative for blurred vision, double vision and photophobia.  Respiratory: Negative.  Negative for cough and shortness of  breath.   Cardiovascular: Negative.  Negative for chest pain, palpitations and leg swelling.  Gastrointestinal: Positive for heartburn (chronic). Negative for nausea and vomiting.  Musculoskeletal: Positive for back pain and joint pain. Negative for myalgias.  Neurological: Negative.  Negative for dizziness, focal weakness, seizures and headaches.  Psychiatric/Behavioral: Negative.  Negative for suicidal ideas.    Past Medical History:  Diagnosis Date  . Anxiety   . Back pain with right-sided radiculopathy   . Depression   . High cholesterol     History reviewed. No pertinent surgical history.  Family History  Problem Relation Age of Onset  . Hypertension Paternal Uncle   . Diabetes Paternal Uncle     Social History Reviewed with no changes to be made today.   Outpatient Medications Prior to Visit  Medication Sig Dispense Refill  . ARIPiprazole (ABILIFY) 2 MG tablet Take 1 tablet (2 mg total) by mouth daily. 90 tablet 0  . aspirin 81 MG tablet Take 1 tablet (81 mg total) by mouth daily. 90 tablet 3  . nortriptyline (PAMELOR) 75 MG capsule Take 1 capsule (75 mg total) by mouth at bedtime. 90 capsule 0  . PARoxetine (PAXIL) 40 MG tablet Take 1 tablet (40 mg total) by mouth daily. 90 tablet 0  . traZODone (DESYREL) 50 MG tablet Take 1 tablet (50 mg total) by mouth at bedtime. 90 tablet 0  . naproxen (NAPROSYN) 500 MG tablet Take 1 tablet (500 mg total) by mouth daily as needed for moderate pain. Office visit needed 30 tablet 0  . atorvastatin (LIPITOR) 40 MG tablet Take 1 tablet (40 mg total) by mouth daily. Office visit needed 30 tablet 0  . fluticasone (FLONASE) 50 MCG/ACT nasal spray Place 1-2 sprays into both nostrils daily. 1 g 0  . omeprazole (PRILOSEC) 20 MG capsule Take 1 capsule (20 mg total) by mouth daily. 90 capsule 1   No facility-administered medications prior to visit.    Allergies  Allergen Reactions  . Flexeril [Cyclobenzaprine]     Pt turns very aggressive        Objective:    BP (!) 119/92 (BP Location: Left Arm, Patient Position: Sitting, Cuff Size: Normal)   Pulse (!) 102   Temp 97.7 F (36.5 C) (Temporal)   Ht '5\' 8"'  (1.727 m)   Wt 185 lb (83.9 kg)   SpO2 95%   BMI 28.13 kg/m  Wt Readings from Last 3 Encounters:  10/19/19 185 lb (83.9 kg)  02/22/18 179 lb 6.4 oz (81.4 kg)  11/26/17 173 lb (78.5 kg)    Physical Exam Vitals and nursing note reviewed.  Constitutional:      Appearance: He is well-developed.  HENT:     Head: Normocephalic and atraumatic.  Cardiovascular:     Rate and Rhythm: Regular rhythm. Tachycardia present.     Heart sounds: Normal heart sounds. No murmur heard.  No friction rub. No gallop.   Pulmonary:     Effort: Pulmonary effort  is normal. No tachypnea or respiratory distress.     Breath sounds: Normal breath sounds. No decreased breath sounds, wheezing, rhonchi or rales.  Chest:     Chest wall: No tenderness.  Abdominal:     General: Bowel sounds are normal.     Palpations: Abdomen is soft.  Musculoskeletal:        General: Normal range of motion.     Cervical back: Normal range of motion.  Skin:    General: Skin is warm and dry.  Neurological:     Mental Status: He is alert and oriented to person, place, and time.     Coordination: Coordination normal.  Psychiatric:        Behavior: Behavior normal. Behavior is cooperative.        Thought Content: Thought content normal.        Judgment: Judgment normal.          Patient has been counseled extensively about nutrition and exercise as well as the importance of adherence with medications and regular follow-up. The patient was given clear instructions to go to ER or return to medical center if symptoms don't improve, worsen or new problems develop. The patient verbalized understanding.   Follow-up: Return in about 6 months (around 04/20/2020).   Gildardo Pounds, FNP-BC [facility] [place],  [place]   10/19/2019, 7:07 PM

## 2019-10-19 NOTE — Telephone Encounter (Signed)
Please advise.  Copied from CRM [id number]. Topic: General - Call Back - No Documentation >> Oct 19, 2019  4:50 PM Randol Kern wrote: Reason for CRM: Clydie Braun from [facility] pharmacy called needing to speak with clinic regarding pt's refills. Needs diagnosis code for Tylenol refill. Please advise

## 2019-10-19 NOTE — Telephone Encounter (Signed)
PCP spoke to pharmacy.

## 2019-10-20 DIAGNOSIS — R262 Difficulty in walking, not elsewhere classified: Secondary | ICD-10-CM | POA: Diagnosis not present

## 2019-10-20 DIAGNOSIS — L89609 Pressure ulcer of unspecified heel, unspecified stage: Secondary | ICD-10-CM | POA: Diagnosis not present

## 2019-10-20 DIAGNOSIS — M25671 Stiffness of right ankle, not elsewhere classified: Secondary | ICD-10-CM | POA: Diagnosis not present

## 2019-10-20 DIAGNOSIS — M25571 Pain in right ankle and joints of right foot: Secondary | ICD-10-CM | POA: Diagnosis not present

## 2019-10-20 DIAGNOSIS — S82301D Unspecified fracture of lower end of right tibia, subsequent encounter for closed fracture with routine healing: Secondary | ICD-10-CM | POA: Diagnosis not present

## 2019-10-20 LAB — LIPID PANEL
Chol/HDL Ratio: 7.2 ratio — ABNORMAL HIGH (ref 0.0–5.0)
Cholesterol, Total: 251 mg/dL — ABNORMAL HIGH (ref 100–199)
HDL: 35 mg/dL — ABNORMAL LOW (ref >39)
LDL Chol Calc (NIH): 182 mg/dL — ABNORMAL HIGH (ref 0–99)
Triglycerides: 180 mg/dL — ABNORMAL HIGH (ref 0–149)
VLDL Cholesterol Cal: 34 mg/dL (ref 5–40)

## 2019-10-20 LAB — DRUG SCREEN 12+ALCOHOL+CRT, UR
Amphetamines, Urine: NEGATIVE ng/mL
BENZODIAZ UR QL: NEGATIVE ng/mL
Barbiturate: NEGATIVE ng/mL
Cannabinoids: NEGATIVE ng/mL
Cocaine (Metabolite): NEGATIVE ng/mL
Creatinine, Urine: 263.4 mg/dL (ref 20.0–300.0)
Ethanol, Urine: NEGATIVE %
Meperidine: NEGATIVE ng/mL
Methadone: NEGATIVE ng/mL
OPIATE SCREEN URINE: NEGATIVE ng/mL
Oxycodone/Oxymorphone, Urine: NEGATIVE ng/mL
Phencyclidine: NEGATIVE ng/mL
Propoxyphene: NEGATIVE ng/mL
Tramadol: NEGATIVE ng/mL

## 2019-10-20 LAB — CBC
Hematocrit: 49.4 % (ref 37.5–51.0)
Hemoglobin: 16.7 g/dL (ref 13.0–17.7)
MCH: 29.2 pg (ref 26.6–33.0)
MCHC: 33.8 g/dL (ref 31.5–35.7)
MCV: 86 fL (ref 79–97)
Platelets: 275 10*3/uL (ref 150–450)
RBC: 5.72 x10E6/uL (ref 4.14–5.80)
RDW: 14.4 % (ref 11.6–15.4)
WBC: 10.3 10*3/uL (ref 3.4–10.8)

## 2019-10-20 LAB — CMP14+EGFR
ALT: 95 IU/L — ABNORMAL HIGH (ref 0–44)
AST: 50 IU/L — ABNORMAL HIGH (ref 0–40)
Albumin/Globulin Ratio: 1.5 (ref 1.2–2.2)
Albumin: 4.6 g/dL (ref 4.0–5.0)
Alkaline Phosphatase: 140 IU/L — ABNORMAL HIGH (ref 48–121)
BUN/Creatinine Ratio: 20 (ref 9–20)
BUN: 19 mg/dL (ref 6–24)
Bilirubin Total: 0.5 mg/dL (ref 0.0–1.2)
CO2: 25 mmol/L (ref 20–29)
Calcium: 9.6 mg/dL (ref 8.7–10.2)
Chloride: 100 mmol/L (ref 96–106)
Creatinine, Ser: 0.95 mg/dL (ref 0.76–1.27)
GFR calc Af Amer: 110 mL/min/{1.73_m2} (ref >59)
GFR calc non Af Amer: 95 mL/min/{1.73_m2} (ref >59)
Globulin, Total: 3.1 g/dL (ref 1.5–4.5)
Glucose: 67 mg/dL (ref 65–99)
Potassium: 4.3 mmol/L (ref 3.5–5.2)
Sodium: 137 mmol/L (ref 134–144)
Total Protein: 7.7 g/dL (ref 6.0–8.5)

## 2019-10-20 LAB — HIV ANTIBODY (ROUTINE TESTING W REFLEX): HIV Screen 4th Generation wRfx: NONREACTIVE

## 2019-10-20 NOTE — Telephone Encounter (Signed)
Refill requested

## 2019-10-25 DIAGNOSIS — S82391D Other fracture of lower end of right tibia, subsequent encounter for closed fracture with routine healing: Secondary | ICD-10-CM | POA: Diagnosis not present

## 2019-10-25 DIAGNOSIS — S82301K Unspecified fracture of lower end of right tibia, subsequent encounter for closed fracture with nonunion: Secondary | ICD-10-CM | POA: Diagnosis not present

## 2019-10-25 DIAGNOSIS — S82401K Unspecified fracture of shaft of right fibula, subsequent encounter for closed fracture with nonunion: Secondary | ICD-10-CM | POA: Diagnosis not present

## 2019-10-26 DIAGNOSIS — M25571 Pain in right ankle and joints of right foot: Secondary | ICD-10-CM | POA: Diagnosis not present

## 2019-10-26 DIAGNOSIS — R262 Difficulty in walking, not elsewhere classified: Secondary | ICD-10-CM | POA: Diagnosis not present

## 2019-10-26 DIAGNOSIS — M25671 Stiffness of right ankle, not elsewhere classified: Secondary | ICD-10-CM | POA: Diagnosis not present

## 2019-10-26 DIAGNOSIS — S82301D Unspecified fracture of lower end of right tibia, subsequent encounter for closed fracture with routine healing: Secondary | ICD-10-CM | POA: Diagnosis not present

## 2019-10-26 DIAGNOSIS — L89609 Pressure ulcer of unspecified heel, unspecified stage: Secondary | ICD-10-CM | POA: Diagnosis not present

## 2019-10-27 DIAGNOSIS — S82301D Unspecified fracture of lower end of right tibia, subsequent encounter for closed fracture with routine healing: Secondary | ICD-10-CM | POA: Diagnosis not present

## 2019-10-27 DIAGNOSIS — R262 Difficulty in walking, not elsewhere classified: Secondary | ICD-10-CM | POA: Diagnosis not present

## 2019-10-27 DIAGNOSIS — M25671 Stiffness of right ankle, not elsewhere classified: Secondary | ICD-10-CM | POA: Diagnosis not present

## 2019-10-27 DIAGNOSIS — M25571 Pain in right ankle and joints of right foot: Secondary | ICD-10-CM | POA: Diagnosis not present

## 2019-10-27 DIAGNOSIS — L89609 Pressure ulcer of unspecified heel, unspecified stage: Secondary | ICD-10-CM | POA: Diagnosis not present

## 2019-11-01 DIAGNOSIS — L89609 Pressure ulcer of unspecified heel, unspecified stage: Secondary | ICD-10-CM | POA: Diagnosis not present

## 2019-11-01 DIAGNOSIS — M25571 Pain in right ankle and joints of right foot: Secondary | ICD-10-CM | POA: Diagnosis not present

## 2019-11-01 DIAGNOSIS — M25671 Stiffness of right ankle, not elsewhere classified: Secondary | ICD-10-CM | POA: Diagnosis not present

## 2019-11-01 DIAGNOSIS — S82301D Unspecified fracture of lower end of right tibia, subsequent encounter for closed fracture with routine healing: Secondary | ICD-10-CM | POA: Diagnosis not present

## 2019-11-01 DIAGNOSIS — R262 Difficulty in walking, not elsewhere classified: Secondary | ICD-10-CM | POA: Diagnosis not present

## 2019-11-08 ENCOUNTER — Encounter: Payer: Medicare HMO | Attending: Physical Medicine & Rehabilitation | Admitting: Physical Medicine & Rehabilitation

## 2019-11-15 DIAGNOSIS — R262 Difficulty in walking, not elsewhere classified: Secondary | ICD-10-CM | POA: Diagnosis not present

## 2019-11-15 DIAGNOSIS — L89609 Pressure ulcer of unspecified heel, unspecified stage: Secondary | ICD-10-CM | POA: Diagnosis not present

## 2019-11-15 DIAGNOSIS — M25571 Pain in right ankle and joints of right foot: Secondary | ICD-10-CM | POA: Diagnosis not present

## 2019-11-15 DIAGNOSIS — M25671 Stiffness of right ankle, not elsewhere classified: Secondary | ICD-10-CM | POA: Diagnosis not present

## 2019-11-15 DIAGNOSIS — S82301D Unspecified fracture of lower end of right tibia, subsequent encounter for closed fracture with routine healing: Secondary | ICD-10-CM | POA: Diagnosis not present

## 2019-11-17 DIAGNOSIS — M25571 Pain in right ankle and joints of right foot: Secondary | ICD-10-CM | POA: Diagnosis not present

## 2019-11-17 DIAGNOSIS — L89609 Pressure ulcer of unspecified heel, unspecified stage: Secondary | ICD-10-CM | POA: Diagnosis not present

## 2019-11-17 DIAGNOSIS — R262 Difficulty in walking, not elsewhere classified: Secondary | ICD-10-CM | POA: Diagnosis not present

## 2019-11-17 DIAGNOSIS — S82301D Unspecified fracture of lower end of right tibia, subsequent encounter for closed fracture with routine healing: Secondary | ICD-10-CM | POA: Diagnosis not present

## 2019-11-17 DIAGNOSIS — M25671 Stiffness of right ankle, not elsewhere classified: Secondary | ICD-10-CM | POA: Diagnosis not present

## 2019-11-22 DIAGNOSIS — S82301D Unspecified fracture of lower end of right tibia, subsequent encounter for closed fracture with routine healing: Secondary | ICD-10-CM | POA: Diagnosis not present

## 2019-11-22 DIAGNOSIS — L89609 Pressure ulcer of unspecified heel, unspecified stage: Secondary | ICD-10-CM | POA: Diagnosis not present

## 2019-11-22 DIAGNOSIS — M25571 Pain in right ankle and joints of right foot: Secondary | ICD-10-CM | POA: Diagnosis not present

## 2019-11-22 DIAGNOSIS — R262 Difficulty in walking, not elsewhere classified: Secondary | ICD-10-CM | POA: Diagnosis not present

## 2019-11-22 DIAGNOSIS — M25671 Stiffness of right ankle, not elsewhere classified: Secondary | ICD-10-CM | POA: Diagnosis not present

## 2019-11-29 DIAGNOSIS — M25671 Stiffness of right ankle, not elsewhere classified: Secondary | ICD-10-CM | POA: Diagnosis not present

## 2019-11-29 DIAGNOSIS — S82301D Unspecified fracture of lower end of right tibia, subsequent encounter for closed fracture with routine healing: Secondary | ICD-10-CM | POA: Diagnosis not present

## 2019-11-29 DIAGNOSIS — M25571 Pain in right ankle and joints of right foot: Secondary | ICD-10-CM | POA: Diagnosis not present

## 2019-11-29 DIAGNOSIS — R262 Difficulty in walking, not elsewhere classified: Secondary | ICD-10-CM | POA: Diagnosis not present

## 2019-11-29 DIAGNOSIS — L89609 Pressure ulcer of unspecified heel, unspecified stage: Secondary | ICD-10-CM | POA: Diagnosis not present

## 2019-12-01 DIAGNOSIS — S82301D Unspecified fracture of lower end of right tibia, subsequent encounter for closed fracture with routine healing: Secondary | ICD-10-CM | POA: Diagnosis not present

## 2019-12-01 DIAGNOSIS — L89609 Pressure ulcer of unspecified heel, unspecified stage: Secondary | ICD-10-CM | POA: Diagnosis not present

## 2019-12-01 DIAGNOSIS — R262 Difficulty in walking, not elsewhere classified: Secondary | ICD-10-CM | POA: Diagnosis not present

## 2019-12-01 DIAGNOSIS — M25571 Pain in right ankle and joints of right foot: Secondary | ICD-10-CM | POA: Diagnosis not present

## 2019-12-01 DIAGNOSIS — M25671 Stiffness of right ankle, not elsewhere classified: Secondary | ICD-10-CM | POA: Diagnosis not present

## 2019-12-06 DIAGNOSIS — L89609 Pressure ulcer of unspecified heel, unspecified stage: Secondary | ICD-10-CM | POA: Diagnosis not present

## 2019-12-06 DIAGNOSIS — R262 Difficulty in walking, not elsewhere classified: Secondary | ICD-10-CM | POA: Diagnosis not present

## 2019-12-06 DIAGNOSIS — S82301D Unspecified fracture of lower end of right tibia, subsequent encounter for closed fracture with routine healing: Secondary | ICD-10-CM | POA: Diagnosis not present

## 2019-12-06 DIAGNOSIS — M25571 Pain in right ankle and joints of right foot: Secondary | ICD-10-CM | POA: Diagnosis not present

## 2019-12-06 DIAGNOSIS — M25671 Stiffness of right ankle, not elsewhere classified: Secondary | ICD-10-CM | POA: Diagnosis not present

## 2019-12-08 DIAGNOSIS — L89609 Pressure ulcer of unspecified heel, unspecified stage: Secondary | ICD-10-CM | POA: Diagnosis not present

## 2019-12-08 DIAGNOSIS — M25671 Stiffness of right ankle, not elsewhere classified: Secondary | ICD-10-CM | POA: Diagnosis not present

## 2019-12-08 DIAGNOSIS — M25571 Pain in right ankle and joints of right foot: Secondary | ICD-10-CM | POA: Diagnosis not present

## 2019-12-08 DIAGNOSIS — S82301D Unspecified fracture of lower end of right tibia, subsequent encounter for closed fracture with routine healing: Secondary | ICD-10-CM | POA: Diagnosis not present

## 2019-12-08 DIAGNOSIS — R262 Difficulty in walking, not elsewhere classified: Secondary | ICD-10-CM | POA: Diagnosis not present

## 2019-12-13 DIAGNOSIS — L89609 Pressure ulcer of unspecified heel, unspecified stage: Secondary | ICD-10-CM | POA: Diagnosis not present

## 2019-12-13 DIAGNOSIS — S82301D Unspecified fracture of lower end of right tibia, subsequent encounter for closed fracture with routine healing: Secondary | ICD-10-CM | POA: Diagnosis not present

## 2019-12-13 DIAGNOSIS — R262 Difficulty in walking, not elsewhere classified: Secondary | ICD-10-CM | POA: Diagnosis not present

## 2019-12-13 DIAGNOSIS — M25671 Stiffness of right ankle, not elsewhere classified: Secondary | ICD-10-CM | POA: Diagnosis not present

## 2019-12-13 DIAGNOSIS — M25571 Pain in right ankle and joints of right foot: Secondary | ICD-10-CM | POA: Diagnosis not present

## 2019-12-15 DIAGNOSIS — S82301D Unspecified fracture of lower end of right tibia, subsequent encounter for closed fracture with routine healing: Secondary | ICD-10-CM | POA: Diagnosis not present

## 2019-12-15 DIAGNOSIS — M25571 Pain in right ankle and joints of right foot: Secondary | ICD-10-CM | POA: Diagnosis not present

## 2019-12-15 DIAGNOSIS — R262 Difficulty in walking, not elsewhere classified: Secondary | ICD-10-CM | POA: Diagnosis not present

## 2019-12-15 DIAGNOSIS — M25671 Stiffness of right ankle, not elsewhere classified: Secondary | ICD-10-CM | POA: Diagnosis not present

## 2019-12-15 DIAGNOSIS — L89609 Pressure ulcer of unspecified heel, unspecified stage: Secondary | ICD-10-CM | POA: Diagnosis not present

## 2019-12-20 DIAGNOSIS — S82301D Unspecified fracture of lower end of right tibia, subsequent encounter for closed fracture with routine healing: Secondary | ICD-10-CM | POA: Diagnosis not present

## 2019-12-20 DIAGNOSIS — R262 Difficulty in walking, not elsewhere classified: Secondary | ICD-10-CM | POA: Diagnosis not present

## 2019-12-20 DIAGNOSIS — M25671 Stiffness of right ankle, not elsewhere classified: Secondary | ICD-10-CM | POA: Diagnosis not present

## 2019-12-20 DIAGNOSIS — M25571 Pain in right ankle and joints of right foot: Secondary | ICD-10-CM | POA: Diagnosis not present

## 2019-12-20 DIAGNOSIS — L89609 Pressure ulcer of unspecified heel, unspecified stage: Secondary | ICD-10-CM | POA: Diagnosis not present

## 2019-12-22 DIAGNOSIS — M25671 Stiffness of right ankle, not elsewhere classified: Secondary | ICD-10-CM | POA: Diagnosis not present

## 2019-12-22 DIAGNOSIS — L89609 Pressure ulcer of unspecified heel, unspecified stage: Secondary | ICD-10-CM | POA: Diagnosis not present

## 2019-12-22 DIAGNOSIS — M25571 Pain in right ankle and joints of right foot: Secondary | ICD-10-CM | POA: Diagnosis not present

## 2019-12-22 DIAGNOSIS — S82301D Unspecified fracture of lower end of right tibia, subsequent encounter for closed fracture with routine healing: Secondary | ICD-10-CM | POA: Diagnosis not present

## 2019-12-22 DIAGNOSIS — R262 Difficulty in walking, not elsewhere classified: Secondary | ICD-10-CM | POA: Diagnosis not present

## 2020-01-04 ENCOUNTER — Other Ambulatory Visit: Payer: Self-pay

## 2020-01-04 ENCOUNTER — Telehealth (HOSPITAL_COMMUNITY): Payer: Medicare HMO | Admitting: Psychiatry

## 2020-01-05 ENCOUNTER — Telehealth (HOSPITAL_COMMUNITY): Payer: Medicare HMO | Admitting: Psychiatry

## 2020-01-11 ENCOUNTER — Telehealth (INDEPENDENT_AMBULATORY_CARE_PROVIDER_SITE_OTHER): Payer: Medicare HMO | Admitting: Psychiatry

## 2020-01-11 ENCOUNTER — Encounter (HOSPITAL_COMMUNITY): Payer: Self-pay | Admitting: Psychiatry

## 2020-01-11 ENCOUNTER — Other Ambulatory Visit: Payer: Self-pay

## 2020-01-11 DIAGNOSIS — F331 Major depressive disorder, recurrent, moderate: Secondary | ICD-10-CM | POA: Diagnosis not present

## 2020-01-11 DIAGNOSIS — F411 Generalized anxiety disorder: Secondary | ICD-10-CM

## 2020-01-11 DIAGNOSIS — F5101 Primary insomnia: Secondary | ICD-10-CM

## 2020-01-11 MED ORDER — NORTRIPTYLINE HCL 75 MG PO CAPS: 75.0000 mg | ORAL_CAPSULE | Freq: Every day | ORAL | 0 refills | Status: DC

## 2020-01-11 MED ORDER — PAROXETINE HCL 40 MG PO TABS: 40.0000 mg | ORAL_TABLET | Freq: Every day | ORAL | 0 refills | Status: DC

## 2020-01-11 MED ORDER — TRAZODONE HCL 50 MG PO TABS: 50.0000 mg | ORAL_TABLET | Freq: Every day | ORAL | 0 refills | Status: DC

## 2020-01-11 MED ORDER — ARIPIPRAZOLE 2 MG PO TABS: 2.0000 mg | ORAL_TABLET | Freq: Every day | ORAL | 0 refills | Status: DC

## 2020-01-11 NOTE — Progress Notes (Signed)
Virtual Visit via Telephone Note  I connected with Joshua [name] on 01/11/20 at  1:20 PM EDT by telephone and verified that I am speaking with the correct person using two identifiers.  Location: Patient: Home Provider: Home Office   I discussed the limitations, risks, security and privacy concerns of performing an evaluation and management service by telephone and the availability of in person appointments. I also discussed with the patient that there may be a patient responsible charge related to this service. The patient expressed understanding and agreed to proceed.   History of Present Illness: Patient is evaluated by phone session. He is taking his medication as prescribed. He does not speak English however his wife Joshua [name] helped translate. He reported medicine working very well and he is sleeping good. He denies any crying spells or any feeling of hopelessness or worthlessness. He has chronic pain and tomorrow he is going to see his physician to address his chronic pain issues. His appetite is okay. In the past we have tried cutting down the medication but his anxiety and depression come back. He does not want to change the dose of the medication. He lives with his wife who is very supportive and 3 kids. He denies drinking or using any illegal substances. Denies any tremors, shakes or any EPS.  Past Psychiatric History: H/O inpatient in [place] in 2008 when walkedinto traffic. TriedRisperdal, Cymbalta and temazepam with poor outcome. Seeingpsychiatrist inouroffice since October 2018.  Recent Results (from the past 2160 hour(s))  Drug Screen 12+Alcohol+CRT, Ur     Status: None   Collection Time: 10/19/19  4:25 PM  Result Value Ref Range   Ethanol, Urine Negative Cutoff=0.020 %   Amphetamines, Urine Negative Cutoff=1000 ng/mL    Comment: Amphetamine test includes Amphetamine and Methamphetamine.   Barbiturate Negative Cutoff=200 ng/mL   BENZODIAZ UR QL Negative  Cutoff=200 ng/mL   Cannabinoids Negative Cutoff=20 ng/mL   Cocaine (Metabolite) Negative Cutoff=300 ng/mL   OPIATE SCREEN URINE Negative Cutoff=300 ng/mL    Comment: Opiate test includes Codeine, Morphine, Hydromorphone, Hydrocodone.   Oxycodone/Oxymorphone, Urine Negative Cutoff=300 ng/mL    Comment: Test includes Oxycodone and Oxymorphone   Phencyclidine Negative Cutoff=25 ng/mL   Methadone Negative Cutoff=300 ng/mL   Propoxyphene Negative Cutoff=300 ng/mL   Meperidine Negative Cutoff=200 ng/mL    Comment: This test was developed and its performance characteristics determined by [facility]. It has not been cleared or approved by the Food and Drug Administration.    Tramadol Negative Cutoff=200 ng/mL   Creatinine, Urine 263.4 20.0 - 300.0 mg/dL  HIV antibody (with reflex)     Status: None   Collection Time: 10/19/19  4:26 PM  Result Value Ref Range   HIV Screen 4th Generation wRfx Non Reactive Non Reactive  CBC     Status: None   Collection Time: 10/19/19  4:26 PM  Result Value Ref Range   WBC 10.3 3.4 - 10.8 x10E3/uL   RBC 5.72 4.14 - 5.80 x10E6/uL   Hemoglobin 16.7 13.0 - 17.7 g/dL   Hematocrit 49.4 37.5 - 51.0 %   MCV 86 79 - 97 fL   MCH 29.2 26.6 - 33.0 pg   MCHC 33.8 31 - 35 g/dL   RDW 14.4 11.6 - 15.4 %   Platelets 275 150 - 450 x10E3/uL  CMP14+EGFR     Status: Abnormal   Collection Time: 10/19/19  4:26 PM  Result Value Ref Range   Glucose 67 65 - 99 mg/dL   BUN 19 6 -  24 mg/dL   Creatinine, Ser 0.95 0.76 - 1.27 mg/dL   GFR calc non Af Amer 95 >59 mL/min/1.73   GFR calc Af Amer 110 >59 mL/min/1.73    Comment: **[facility] currently reports eGFR in compliance with the current**   recommendations of the [employer]. [facility] will   update reporting as new guidelines are published from the NKF-ASN   Task force.    BUN/Creatinine Ratio 20 9 - 20   Sodium 137 134 - 144 mmol/L   Potassium 4.3 3.5 - 5.2 mmol/L   Chloride 100 96 - 106 mmol/L   CO2 25 20 - 29  mmol/L   Calcium 9.6 8.7 - 10.2 mg/dL   Total Protein 7.7 6.0 - 8.5 g/dL   Albumin 4.6 4.0 - 5.0 g/dL   Globulin, Total 3.1 1.5 - 4.5 g/dL   Albumin/Globulin Ratio 1.5 1.2 - 2.2   Bilirubin Total 0.5 0.0 - 1.2 mg/dL   Alkaline Phosphatase 140 (H) 48 - 121 IU/L   AST 50 (H) 0 - 40 IU/L   ALT 95 (H) 0 - 44 IU/L  Lipid panel     Status: Abnormal   Collection Time: 10/19/19  4:26 PM  Result Value Ref Range   Cholesterol, Total 251 (H) 100 - 199 mg/dL   Triglycerides 180 (H) 0 - 149 mg/dL   HDL 35 (L) >39 mg/dL   VLDL Cholesterol Cal 34 5 - 40 mg/dL   LDL Chol Calc (NIH) 182 (H) 0 - 99 mg/dL   Chol/HDL Ratio 7.2 (H) 0.0 - 5.0 ratio    Comment:                                   T. Chol/HDL Ratio                                             Men  Women                               1/2 Avg.Risk  3.4    3.3                                   Avg.Risk  5.0    4.4                                2X Avg.Risk  9.6    7.1                                3X Avg.Risk 23.4   11.0      Psychiatric Specialty Exam: Physical Exam  Review of Systems  There were no vitals taken for this visit.There is no height or weight on file to calculate BMI.  General Appearance: NA  Eye Contact:  NA  Speech:  Slow  Volume:  Decreased  Mood:  Euthymic  Affect:  NA  Thought Process:  Descriptions of Associations: Intact  Orientation:  Full (Time, Place, and Person)  Thought Content:  WDL  Suicidal Thoughts:  No  Homicidal Thoughts:  No  Memory:  Immediate;   Good Recent;   Fair Remote;   Fair  Judgement:  Intact  Insight:  Present  Psychomotor Activity:  NA  Concentration:  Concentration: Fair and Attention Span: Fair  Recall:  [employer] of Knowledge:  Fair  Language:  speaks spanish  Akathisia:  No  Handed:  Right  AIMS (if indicated):     Assets:  Desire for Improvement Housing Resilience Social Support  ADL's:  Intact  Cognition:  WNL  Sleep:   Good      Assessment and Plan: Major  depressive disorder, recurrent. Generalized anxiety disorder. Primary insomnia.  Patient is taking his medication and reported to be stable on the current dose. We discussed polypharmacy but patient resistant to cut down the dose of the medication. He was explained about possible side effects which include serotonin syndrome, tremors and shakes but patient denies any of these side effects. Continue trazodone 50 mg at bedtime, Paxil 40 mg daily, nortriptyline 75 mg daily and Abilify 2 mg daily. I reviewed blood work which was done 2 months ago. He has high cholesterol and LDL. His creatinine, BUN and sugar was normal. Recommended to call us back if is any question or any concern. Follow-up in 3 months.  Follow Up Instructions:    I discussed the assessment and treatment plan with the patient. The patient was provided an opportunity to ask questions and all were answered. The patient agreed with the plan and demonstrated an understanding of the instructions.   The patient was advised to call back or seek an in-person evaluation if the symptoms worsen or if the condition fails to improve as anticipated.  I provided 21 minutes of non-face-to-face time during this encounter.   Kathlee Nations, MD

## 2020-01-12 DIAGNOSIS — S82831A Other fracture of upper and lower end of right fibula, initial encounter for closed fracture: Secondary | ICD-10-CM | POA: Diagnosis not present

## 2020-01-12 DIAGNOSIS — S82301K Unspecified fracture of lower end of right tibia, subsequent encounter for closed fracture with nonunion: Secondary | ICD-10-CM | POA: Diagnosis not present

## 2020-03-29 ENCOUNTER — Other Ambulatory Visit: Payer: Self-pay

## 2020-03-29 ENCOUNTER — Other Ambulatory Visit: Payer: Self-pay | Admitting: Nurse Practitioner

## 2020-03-29 ENCOUNTER — Telehealth (INDEPENDENT_AMBULATORY_CARE_PROVIDER_SITE_OTHER): Payer: Medicare HMO | Admitting: Psychiatry

## 2020-03-29 DIAGNOSIS — F411 Generalized anxiety disorder: Secondary | ICD-10-CM | POA: Diagnosis not present

## 2020-03-29 DIAGNOSIS — G8929 Other chronic pain: Secondary | ICD-10-CM

## 2020-03-29 DIAGNOSIS — F5101 Primary insomnia: Secondary | ICD-10-CM

## 2020-03-29 DIAGNOSIS — F331 Major depressive disorder, recurrent, moderate: Secondary | ICD-10-CM | POA: Diagnosis not present

## 2020-03-29 MED ORDER — ARIPIPRAZOLE 2 MG PO TABS: 2.0000 mg | ORAL_TABLET | Freq: Every day | ORAL | 0 refills | Status: DC

## 2020-03-29 MED ORDER — PAROXETINE HCL 40 MG PO TABS: 40.0000 mg | ORAL_TABLET | Freq: Every day | ORAL | 0 refills | Status: DC

## 2020-03-29 MED ORDER — TRAZODONE HCL 50 MG PO TABS: 50.0000 mg | ORAL_TABLET | Freq: Every day | ORAL | 0 refills | Status: DC

## 2020-03-29 MED ORDER — NORTRIPTYLINE HCL 75 MG PO CAPS: 75.0000 mg | ORAL_CAPSULE | Freq: Every day | ORAL | 0 refills | Status: DC

## 2020-03-29 NOTE — Progress Notes (Signed)
Virtual Visit via Telephone Note  I connected with Joshua [name] on 03/29/20 at  1:40 PM EST by telephone and verified that I am speaking with the correct person using two identifiers.  Location: Patient: Home Provider: Home Office   I discussed the limitations, risks, security and privacy concerns of performing an evaluation and management service by telephone and the availability of in person appointments. I also discussed with the patient that there may be a patient responsible charge related to this service. The patient expressed understanding and agreed to proceed.   History of Present Illness: Patient is evaluated by phone session.  He speaks Spanish but his son Joshua [name] helped him.  He does not want to change the medication because it feels things are going well.  He has been sleeping good.  He denies any depression, crying spells, feeling of hopelessness or worthlessness.  Denies any panic attack.  He feels his anxiety sleep and depression is a stable.  His energy level is good.  He has not checked his weight in a while but he does not notice any change in his weight.  His appetite is good.  He has no tremors, shakes or any EPS.  He lives with his wife and 3 kids.  He denies drinking or using any illegal substances.   Past Psychiatric History: H/O inpatient in [place] in [place] when walkedinto traffic. TriedRisperdal, Cymbalta and temazepam with poor outcome. Seeingpsychiatrist inouroffice since October 2018.   Psychiatric Specialty Exam: Physical Exam  Review of Systems  There were no vitals taken for this visit.There is no height or weight on file to calculate BMI.  General Appearance: NA  Eye Contact:  NA  Speech:  Slow  Volume:  Decreased  Mood:  Euthymic  Affect:  NA  Thought Process:  Descriptions of Associations: Intact  Orientation:  Full (Time, Place, and Person)  Thought Content:  WDL  Suicidal Thoughts:  No  Homicidal Thoughts:  No  Memory:  Immediate;    Good  Judgement:  Fair  Insight:  Fair  Psychomotor Activity:  NA  Concentration:  Concentration: Fair and Attention Span: Fair  Recall:  [employer] of Knowledge:  Fair  Language:  speaks spanish  Akathisia:  No  Handed:  Right  AIMS (if indicated):     Assets:  Desire for Improvement Housing Social Support  ADL's:  Intact  Cognition:  WNL  Sleep:   good      Assessment and Plan: Primary insomnia, major depressive disorder, recurrent, generalized anxiety disorder.  Patient is a stable on his current medication.  We discussed polypharmacy but patient does not want to change or cut down the medication.  Patient so far has no side effects.  Continue Paxil 40 mg daily, nortriptyline 75 mg daily at bedtime, Abilify 2 mg daily and trazodone 50 mg at bedtime.  Recommended to call us back if is any question or any concern.  Follow-up in 3 months.  Follow Up Instructions:    I discussed the assessment and treatment plan with the patient. The patient was provided an opportunity to ask questions and all were answered. The patient agreed with the plan and demonstrated an understanding of the instructions.   The patient was advised to call back or seek an in-person evaluation if the symptoms worsen or if the condition fails to improve as anticipated.  I provided 9 minutes of non-face-to-face time during this encounter.   Cleotis Nipper, MD

## 2020-04-01 MED ORDER — ACETAMINOPHEN-CODEINE #3 300-30 MG PO TABS: 1.0000 | ORAL_TABLET | Freq: Three times a day (TID) | ORAL | 0 refills | Status: DC | PRN

## 2020-04-03 ENCOUNTER — Telehealth (HOSPITAL_COMMUNITY): Payer: Medicare HMO | Admitting: Psychiatry

## 2020-04-09 ENCOUNTER — Telehealth (HOSPITAL_COMMUNITY): Payer: Medicare HMO | Admitting: Psychiatry

## 2020-04-20 ENCOUNTER — Other Ambulatory Visit: Payer: Self-pay

## 2020-04-20 ENCOUNTER — Ambulatory Visit: Payer: Medicare HMO | Attending: Nurse Practitioner | Admitting: Nurse Practitioner

## 2020-04-20 ENCOUNTER — Encounter: Payer: Self-pay | Admitting: Nurse Practitioner

## 2020-04-20 VITALS — BP 125/83 | HR 67 | Temp 98.0°F | Ht 68.0 in | Wt 191.6 lb

## 2020-04-20 DIAGNOSIS — K219 Gastro-esophageal reflux disease without esophagitis: Secondary | ICD-10-CM

## 2020-04-20 DIAGNOSIS — R0981 Nasal congestion: Secondary | ICD-10-CM | POA: Diagnosis not present

## 2020-04-20 DIAGNOSIS — Z1211 Encounter for screening for malignant neoplasm of colon: Secondary | ICD-10-CM | POA: Diagnosis not present

## 2020-04-20 DIAGNOSIS — E782 Mixed hyperlipidemia: Secondary | ICD-10-CM | POA: Diagnosis not present

## 2020-04-20 DIAGNOSIS — M5441 Lumbago with sciatica, right side: Secondary | ICD-10-CM | POA: Diagnosis not present

## 2020-04-20 DIAGNOSIS — R748 Abnormal levels of other serum enzymes: Secondary | ICD-10-CM | POA: Diagnosis not present

## 2020-04-20 DIAGNOSIS — G8929 Other chronic pain: Secondary | ICD-10-CM

## 2020-04-20 DIAGNOSIS — L249 Irritant contact dermatitis, unspecified cause: Secondary | ICD-10-CM | POA: Diagnosis not present

## 2020-04-20 MED ORDER — FLUTICASONE PROPIONATE 50 MCG/ACT NA SUSP: 1.0000 | Freq: Every day | NASAL | 0 refills | Status: DC

## 2020-04-20 MED ORDER — TRIAMCINOLONE ACETONIDE 0.025 % EX OINT: 1.0000 "application " | TOPICAL_OINTMENT | Freq: Two times a day (BID) | CUTANEOUS | 1 refills | Status: DC

## 2020-04-20 NOTE — Progress Notes (Signed)
Assessment & Plan:  [name] was seen today for follow-up.  Diagnoses and all orders for this visit:  Mixed hyperlipidemia -     Lipid panel -     atorvastatin (LIPITOR) 40 MG tablet; Take 1 tablet (40 mg total) by mouth daily. INSTRUCTIONS: Work on a low fat, heart healthy diet and participate in regular aerobic exercise program by working out at least 150 minutes per week; 5 days a week-30 minutes per day. Avoid red meat/beef/steak,  fried foods. junk foods, sodas, sugary drinks, unhealthy snacking, alcohol and smoking.  Drink at least 80 oz of water per day and monitor your carbohydrate intake daily.    Nasal congestion -     fluticasone (FLONASE) 50 MCG/ACT nasal spray; Place 1-2 sprays into both nostrils daily.  Elevated liver enzymes -     Cancel: CBC -     CMP14+EGFR  Colon cancer screening -     Ambulatory referral to Gastroenterology  Irritant contact dermatitis, unspecified trigger -     triamcinolone (KENALOG) 0.025 % ointment; Apply 1 application topically 2 (two) times daily.                                                      Patient has been counseled on age-appropriate routine health concerns for screening and prevention. These are reviewed and up-to-date. Referrals have been placed accordingly. Immunizations are up-to-date or declined.    Subjective:   Chief Complaint  Patient presents with  . Follow-up    Pt. Is here for 6 months follow up.    HPI Joshua [name] 50 y.o. male presents to office today for follow up.  has a past medical history of Anxiety, Back pain with right-sided radiculopathy, Depression, and High cholesterol.   He is questioning whether he should get a colonoscopy vs Cologuard. I did instruct him to check with his insurance company as the colonoscopy may not be covered if he chooses the cologuard and the results return as positive  Endorses chronic right leg pain. States he will be scheduling with his orthopedist soon for this. He  sees Dr. Myrtice Lauth. Has a history of fracture of right tibia and pressure ulcer of the right heel.    He sees a psychiatrist for his depression and anxiety. Denies any current thoughts of self harm. .    Dyslipidemia Poorly controlled with atorvastatin 40 mg daily. Will add Vascepa. Lovaza not covered by insurance.  Lab Results  Component Value Date   CHOL 255 (H) 04/20/2020   CHOL 251 (H) 10/19/2019   CHOL 226 (H) 02/22/2018   Lab Results  Component Value Date   HDL 38 (L) 04/20/2020   HDL 35 (L) 10/19/2019   HDL 34 (L) 02/22/2018   Lab Results  Component Value Date   LDLCALC 178 (H) 04/20/2020   LDLCALC 182 (H) 10/19/2019   LDLCALC 149 (H) 02/22/2018   Lab Results  Component Value Date   TRIG 208 (H) 04/20/2020   TRIG 180 (H) 10/19/2019   TRIG 217 (H) 02/22/2018   Lab Results  Component Value Date   CHOLHDL 6.7 (H) 04/20/2020   CHOLHDL 7.2 (H) 10/19/2019   CHOLHDL 6.6 (H) 02/22/2018    Review of Systems  Constitutional: Negative for fever, malaise/fatigue and weight loss.  HENT: Positive for congestion. Negative for nosebleeds.  Eyes: Negative.  Negative for blurred vision, double vision and photophobia.  Respiratory: Negative.  Negative for cough and shortness of breath.   Cardiovascular: Negative.  Negative for chest pain, palpitations and leg swelling.  Gastrointestinal: Negative.  Negative for heartburn, nausea and vomiting.  Musculoskeletal: Negative.  Negative for myalgias.  Skin: Positive for itching and rash.  Neurological: Negative.  Negative for dizziness, focal weakness, seizures and headaches.  Psychiatric/Behavioral: Positive for depression. Negative for suicidal ideas. The patient is nervous/anxious.     Past Medical History:  Diagnosis Date  . Anxiety   . Back pain with right-sided radiculopathy   . Depression   . High cholesterol     History reviewed. No pertinent surgical history.  Family History  Problem Relation Age of Onset  .  Hypertension Paternal Uncle   . Diabetes Paternal Uncle     Social History Reviewed with no changes to be made today.   Outpatient Medications Prior to Visit  Medication Sig Dispense Refill  . acetaminophen-codeine (TYLENOL #3) 300-30 MG tablet Take 1-2 tablets by mouth every 8 (eight) hours as needed for moderate pain. 60 tablet 0  . ARIPiprazole (ABILIFY) 2 MG tablet Take 1 tablet (2 mg total) by mouth daily. 90 tablet 0  . nortriptyline (PAMELOR) 75 MG capsule Take 1 capsule (75 mg total) by mouth at bedtime. 90 capsule 0  . PARoxetine (PAXIL) 40 MG tablet Take 1 tablet (40 mg total) by mouth daily. 90 tablet 0  . traZODone (DESYREL) 50 MG tablet Take 1 tablet (50 mg total) by mouth at bedtime. 90 tablet 0  . aspirin 81 MG tablet Take 1 tablet (81 mg total) by mouth daily. (Patient not taking: Reported on 04/20/2020) 90 tablet 3  . acetaminophen-codeine (TYLENOL #3) 300-30 MG tablet Take 1-2 tablets by mouth every 8 (eight) hours as needed for moderate pain. (Patient not taking: Reported on 04/20/2020) 60 tablet 0  . atorvastatin (LIPITOR) 40 MG tablet Take 1 tablet (40 mg total) by mouth daily. Office visit needed 90 tablet 1  . fluticasone (FLONASE) 50 MCG/ACT nasal spray Place 1-2 sprays into both nostrils daily. 18 g 1  . omeprazole (PRILOSEC) 40 MG capsule Take 1 capsule (40 mg total) by mouth daily. 90 capsule 1   No facility-administered medications prior to visit.    Allergies  Allergen Reactions  . Flexeril [Cyclobenzaprine]     Pt turns very aggressive       Objective:    BP 125/83 (BP Location: Left Arm, Patient Position: Sitting, Cuff Size: Normal)   Pulse 67   Temp 98 F (36.7 C) (Oral)   Ht '5\' 8"'  (1.727 m)   Wt 191 lb 9.6 oz (86.9 kg)   SpO2 94%   BMI 29.13 kg/m  Wt Readings from Last 3 Encounters:  04/20/20 191 lb 9.6 oz (86.9 kg)  10/19/19 185 lb (83.9 kg)  02/22/18 179 lb 6.4 oz (81.4 kg)    Physical Exam Vitals and nursing note reviewed.   Constitutional:      Appearance: He is well-developed and well-nourished.  HENT:     Head: Normocephalic and atraumatic.  Eyes:     Extraocular Movements: EOM normal.  Cardiovascular:     Rate and Rhythm: Normal rate and regular rhythm.     Pulses: Intact distal pulses.     Heart sounds: Normal heart sounds. No murmur heard. No friction rub. No gallop.   Pulmonary:     Effort: Pulmonary effort is normal. No tachypnea  or respiratory distress.     Breath sounds: Normal breath sounds. No decreased breath sounds, wheezing, rhonchi or rales.  Chest:     Chest wall: No tenderness.  Abdominal:     General: Bowel sounds are normal.     Palpations: Abdomen is soft.  Musculoskeletal:        General: No edema. Normal range of motion.     Cervical back: Normal range of motion.  Skin:    General: Skin is warm and dry.     Findings: Rash present. Rash is macular.       Neurological:     Mental Status: He is alert and oriented to person, place, and time.     Coordination: Coordination normal.  Psychiatric:        Mood and Affect: Mood and affect normal.        Behavior: Behavior normal. Behavior is cooperative.        Thought Content: Thought content normal.        Judgment: Judgment normal.          Patient has been counseled extensively about nutrition and exercise as well as the importance of adherence with medications and regular follow-up. The patient was given clear instructions to go to ER or return to medical center if symptoms don't improve, worsen or new problems develop. The patient verbalized understanding.   Follow-up: Return in about 3 months (around 07/19/2020).   Gildardo Pounds, FNP-BC [facility] [place], [place]   04/22/2020, 1:04 AM

## 2020-04-20 NOTE — Patient Instructions (Signed)
EUCERIN CREAM AQUAPHOR For moisturizing skin

## 2020-04-21 LAB — CMP14+EGFR
ALT: 62 IU/L — ABNORMAL HIGH (ref 0–44)
AST: 28 IU/L (ref 0–40)
Albumin/Globulin Ratio: 1.8 (ref 1.2–2.2)
Albumin: 4.8 g/dL (ref 4.0–5.0)
Alkaline Phosphatase: 120 IU/L (ref 44–121)
BUN/Creatinine Ratio: 17 (ref 9–20)
BUN: 15 mg/dL (ref 6–24)
Bilirubin Total: 0.4 mg/dL (ref 0.0–1.2)
CO2: 27 mmol/L (ref 20–29)
Calcium: 9.6 mg/dL (ref 8.7–10.2)
Chloride: 104 mmol/L (ref 96–106)
Creatinine, Ser: 0.86 mg/dL (ref 0.76–1.27)
GFR calc Af Amer: 119 mL/min/{1.73_m2} (ref >59)
GFR calc non Af Amer: 103 mL/min/{1.73_m2} (ref >59)
Globulin, Total: 2.7 g/dL (ref 1.5–4.5)
Glucose: 49 mg/dL — ABNORMAL LOW (ref 65–99)
Potassium: 5.4 mmol/L — ABNORMAL HIGH (ref 3.5–5.2)
Sodium: 143 mmol/L (ref 134–144)
Total Protein: 7.5 g/dL (ref 6.0–8.5)

## 2020-04-21 LAB — LIPID PANEL
Chol/HDL Ratio: 6.7 ratio — ABNORMAL HIGH (ref 0.0–5.0)
Cholesterol, Total: 255 mg/dL — ABNORMAL HIGH (ref 100–199)
HDL: 38 mg/dL — ABNORMAL LOW (ref >39)
LDL Chol Calc (NIH): 178 mg/dL — ABNORMAL HIGH (ref 0–99)
Triglycerides: 208 mg/dL — ABNORMAL HIGH (ref 0–149)
VLDL Cholesterol Cal: 39 mg/dL (ref 5–40)

## 2020-04-21 MED ORDER — ATORVASTATIN CALCIUM 40 MG PO TABS: 40.0000 mg | ORAL_TABLET | Freq: Every day | ORAL | 1 refills | Status: DC

## 2020-04-22 ENCOUNTER — Encounter: Payer: Self-pay | Admitting: Nurse Practitioner

## 2020-04-22 MED ORDER — OMEPRAZOLE 40 MG PO CPDR: 40.0000 mg | DELAYED_RELEASE_CAPSULE | Freq: Every day | ORAL | 1 refills | Status: DC

## 2020-04-22 MED ORDER — ATORVASTATIN CALCIUM 40 MG PO TABS: 40.0000 mg | ORAL_TABLET | Freq: Every day | ORAL | 1 refills | Status: DC

## 2020-04-22 MED ORDER — ICOSAPENT ETHYL 1 G PO CAPS: 2.0000 g | ORAL_CAPSULE | Freq: Two times a day (BID) | ORAL | 1 refills | Status: DC

## 2020-04-22 MED ORDER — OMEPRAZOLE 40 MG PO CPDR
40.0000 mg | DELAYED_RELEASE_CAPSULE | Freq: Every day | ORAL | 1 refills | Status: DC
Start: 2020-04-22 — End: 2020-04-22

## 2020-04-27 MED ORDER — ATORVASTATIN CALCIUM 40 MG PO TABS: 40.0000 mg | ORAL_TABLET | Freq: Every day | ORAL | 1 refills | Status: DC

## 2020-04-27 MED ORDER — ICOSAPENT ETHYL 1 G PO CAPS: 2.0000 g | ORAL_CAPSULE | Freq: Two times a day (BID) | ORAL | 1 refills | Status: DC

## 2020-04-27 NOTE — Addendum Note (Signed)
Addended byVidal Schwalbe on: 04/27/2020 01:45 PM   Modules accepted: Orders

## 2020-04-30 ENCOUNTER — Other Ambulatory Visit: Payer: Self-pay | Admitting: Nurse Practitioner

## 2020-04-30 DIAGNOSIS — E782 Mixed hyperlipidemia: Secondary | ICD-10-CM

## 2020-04-30 NOTE — Telephone Encounter (Signed)
Requested medication (s) are due for refill today: yes  Requested medication (s) are on the active medication list:yes  Last refill: both 04/28/19 but transmission failed both have stop date 05/28/19 but are ordered for 90 days  Future visit scheduled: no  Notes to clinic:  please review    Requested Prescriptions  Pending Prescriptions Disp Refills   icosapent Ethyl (VASCEPA) 1 g capsule 120 capsule 1    Sig: Take 2 capsules (2 g total) by mouth 2 (two) times daily.      Off-Protocol Failed - 04/30/2020 10:21 AM      Failed - Medication not assigned to a protocol, review manually.      Passed - Valid encounter within last 12 months    Recent Outpatient Visits           1 week ago Mixed hyperlipidemia   [facility] [place], [place] W, NP   6 months ago Chronic bilateral low back pain with right-sided sciatica   [facility] [place], [place] W, NP   1 year ago Chronic bilateral low back pain with right-sided sciatica   [facility] [place], [place] W, NP   2 years ago Chronic bilateral low back pain with right-sided sciatica   [facility] [place], [place] W, NP   2 years ago Chronic bilateral low back pain with right-sided sciatica   [facility] [place], [place] W, NP                  atorvastatin (LIPITOR) 40 MG tablet 90 tablet 1    Sig: Take 1 tablet (40 mg total) by mouth daily.      Cardiovascular:  Antilipid - Statins Failed - 04/30/2020 10:21 AM      Failed - Total Cholesterol in normal range and within 360 days    Cholesterol, Total  Date Value Ref Range Status  04/20/2020 255 (H) 100 - 199 mg/dL Final          Failed - LDL in normal range and within 360 days    LDL Chol Calc (NIH)  Date Value Ref Range Status  04/20/2020 178 (H) 0 - 99 mg/dL Final          Failed - HDL in normal range and within 360 days    HDL   Date Value Ref Range Status  04/20/2020 38 (L) >39 mg/dL Final          Failed - Triglycerides in normal range and within 360 days    Triglycerides  Date Value Ref Range Status  04/20/2020 208 (H) 0 - 149 mg/dL Final          Passed - Patient is not pregnant      Passed - Valid encounter within last 12 months    Recent Outpatient Visits           1 week ago Mixed hyperlipidemia   [facility] [place], [place] W, NP   6 months ago Chronic bilateral low back pain with right-sided sciatica   [facility] [place], [place] W, NP   1 year ago Chronic bilateral low back pain with right-sided sciatica   [facility] [place], [place] W, NP   2 years ago Chronic bilateral low back pain with right-sided sciatica   [facility] [place], [place] Stakes, NP  2 years ago Chronic bilateral low back pain with right-sided sciatica   [facility] [place], [place] Stakes, NP

## 2020-04-30 NOTE — Telephone Encounter (Signed)
Copied from CRM [id number]. Topic: Quick Communication - Rx Refill/Question >> Apr 30, 2020  9:53 AM Jaquita Rector A wrote: Medication: icosapent Ethyl (VASCEPA) 1 g capsule, atorvastatin (LIPITOR) 40 MG tablet  Rx sent on 04/27/20 not delivered   Has the patient contacted their pharmacy? Yes.   (Agent: If no, request that the patient contact the pharmacy for the refill.) (Agent: If yes, when and what did the pharmacy advise?)  Preferred Pharmacy (with phone number or street name): [facility] - [place], [place] - [address]  Phone:  [phone] Fax:  [phone]     Agent: Please be advised that RX refills may take up to 3 business days. We ask that you follow-up with your pharmacy.

## 2020-06-25 ENCOUNTER — Encounter (HOSPITAL_COMMUNITY): Payer: Self-pay | Admitting: Psychiatry

## 2020-06-25 ENCOUNTER — Other Ambulatory Visit: Payer: Self-pay

## 2020-06-25 ENCOUNTER — Telehealth (INDEPENDENT_AMBULATORY_CARE_PROVIDER_SITE_OTHER): Payer: Medicare HMO | Admitting: Psychiatry

## 2020-06-25 DIAGNOSIS — F331 Major depressive disorder, recurrent, moderate: Secondary | ICD-10-CM

## 2020-06-25 DIAGNOSIS — F411 Generalized anxiety disorder: Secondary | ICD-10-CM | POA: Diagnosis not present

## 2020-06-25 DIAGNOSIS — F5101 Primary insomnia: Secondary | ICD-10-CM

## 2020-06-25 MED ORDER — NORTRIPTYLINE HCL 75 MG PO CAPS: 75.0000 mg | ORAL_CAPSULE | Freq: Every day | ORAL | 0 refills | Status: DC

## 2020-06-25 MED ORDER — PAROXETINE HCL 40 MG PO TABS: 40.0000 mg | ORAL_TABLET | Freq: Every day | ORAL | 0 refills | Status: DC

## 2020-06-25 MED ORDER — ARIPIPRAZOLE 2 MG PO TABS: 2.0000 mg | ORAL_TABLET | Freq: Every day | ORAL | 0 refills | Status: DC

## 2020-06-25 MED ORDER — TRAZODONE HCL 50 MG PO TABS: 50.0000 mg | ORAL_TABLET | Freq: Every day | ORAL | 0 refills | Status: DC

## 2020-06-25 NOTE — Progress Notes (Signed)
Virtual Visit via Telephone Note  I connected with Joshua [name] on 06/25/20 at  2:00 PM EDT by telephone and verified that I am speaking with the correct person using two identifiers.  Location: Patient: Home Provider: Home Office   I discussed the limitations, risks, security and privacy concerns of performing an evaluation and management service by telephone and the availability of in person appointments. I also discussed with the patient that there may be a patient responsible charge related to this service. The patient expressed understanding and agreed to proceed.   History of Present Illness: Patient is evaluated by phone session.  He speaks Spanish but his wife translates for him.  He has been doing good.  He is taking his medication and reported no concerns or side effects.  He denies any crying spells, feeling of hopelessness or any suicidal thoughts.  His appetite is okay.  He is sleeping better and denies any panic attack or any nervousness.  Recently he had a visit with his PCP and he has a lab drawn.  He has high cholesterol and LDL.  He was recommended exercise and walking.  He is hoping to start very soon.  He does not want to change the medication since he feel things are going well.  Past Psychiatric History: H/O inpatient in [place] in 2008 when walkedinto traffic. TriedRisperdal, Cymbalta and temazepam with poor outcome. Seeingpsychiatrist inouroffice since October 2018.   Recent Results (from the past 2160 hour(s))  Lipid panel     Status: Abnormal   Collection Time: 04/20/20  4:19 PM  Result Value Ref Range   Cholesterol, Total 255 (H) 100 - 199 mg/dL   Triglycerides 208 (H) 0 - 149 mg/dL   HDL 38 (L) >39 mg/dL   VLDL Cholesterol Cal 39 5 - 40 mg/dL   LDL Chol Calc (NIH) 178 (H) 0 - 99 mg/dL   Chol/HDL Ratio 6.7 (H) 0.0 - 5.0 ratio    Comment:                                   T. Chol/HDL Ratio                                             Men  Women                                1/2 Avg.Risk  3.4    3.3                                   Avg.Risk  5.0    4.4                                2X Avg.Risk  9.6    7.1                                3X Avg.Risk 23.4   11.0   CMP14+EGFR     Status: Abnormal   Collection Time: 04/20/20  4:19 PM  Result Value Ref Range  Glucose 49 (L) 65 - 99 mg/dL   BUN 15 6 - 24 mg/dL   Creatinine, Ser 0.86 0.76 - 1.27 mg/dL   GFR calc non Af Amer 103 >59 mL/min/1.73   GFR calc Af Amer 119 >59 mL/min/1.73    Comment: **In accordance with recommendations from the NKF-ASN Task force,**   [facility] is in the process of updating its eGFR calculation to the   2021 CKD-EPI creatinine equation that estimates kidney function   without a race variable.    BUN/Creatinine Ratio 17 9 - 20   Sodium 143 134 - 144 mmol/L   Potassium 5.4 (H) 3.5 - 5.2 mmol/L   Chloride 104 96 - 106 mmol/L   CO2 27 20 - 29 mmol/L   Calcium 9.6 8.7 - 10.2 mg/dL   Total Protein 7.5 6.0 - 8.5 g/dL   Albumin 4.8 4.0 - 5.0 g/dL   Globulin, Total 2.7 1.5 - 4.5 g/dL   Albumin/Globulin Ratio 1.8 1.2 - 2.2   Bilirubin Total 0.4 0.0 - 1.2 mg/dL   Alkaline Phosphatase 120 44 - 121 IU/L    Comment:               **Please note reference interval change**   AST 28 0 - 40 IU/L   ALT 62 (H) 0 - 44 IU/L    Psychiatric Specialty Exam: Physical Exam  Review of Systems  Weight 191 lb (86.6 kg).There is no height or weight on file to calculate BMI.  General Appearance: NA  Eye Contact:  NA  Speech:  Slow  Volume:  Normal  Mood:  Euthymic  Affect:  NA  Thought Process:  Goal Directed  Orientation:  Full (Time, Place, and Person)  Thought Content:  WDL  Suicidal Thoughts:  No  Homicidal Thoughts:  No  Memory:  Immediate;   Good Recent;   Fair Remote;   Fair  Judgement:  Fair  Insight:  Present  Psychomotor Activity:  NA  Concentration:  Concentration: Fair and Attention Span: Fair  Recall:  [employer] of Knowledge:  Fair  Language:  Speaks  Spanish  Akathisia:  No  Handed:  Right  AIMS (if indicated):     Assets:  Desire for Improvement Housing Social Support  ADL's:  Intact  Cognition:  WNL  Sleep:   Good patient is evaluated      Assessment and Plan: Primary insomnia.  Major depressive disorder, recurrent.  Anxiety.  Patient is a stable on his medication.  I reviewed blood work results.  I again recommend to cut down the medication but he does not feel comfortable and reluctant to change the dosage.  I will continue Paxil 40 mg daily, nortriptyline 75 mg at bedtime, Abilify 2 mg daily and trazodone 50 mg at bedtime.  Recommended to call us back if is any question or any concern.  Follow-up in 3 months in person.  Follow Up Instructions:    I discussed the assessment and treatment plan with the patient. The patient was provided an opportunity to ask questions and all were answered. The patient agreed with the plan and demonstrated an understanding of the instructions.   The patient was advised to call back or seek an in-person evaluation if the symptoms worsen or if the condition fails to improve as anticipated.  I provided 17 minutes of non-face-to-face time during this encounter.   Kathlee Nations, MD

## 2020-07-05 ENCOUNTER — Other Ambulatory Visit: Payer: Self-pay | Admitting: Nurse Practitioner

## 2020-07-05 DIAGNOSIS — R0981 Nasal congestion: Secondary | ICD-10-CM

## 2020-07-05 MED ORDER — FLUTICASONE PROPIONATE 50 MCG/ACT NA SUSP: NASAL | 1 refills | Status: DC

## 2020-07-05 NOTE — Addendum Note (Signed)
Addended by: Wilford Corner on: 07/05/2020 12:57 PM   Modules accepted: Orders

## 2020-07-05 NOTE — Addendum Note (Signed)
Addended by: Wilford Corner on: 07/05/2020 12:54 PM   Modules accepted: Orders

## 2020-07-26 ENCOUNTER — Other Ambulatory Visit: Payer: Self-pay | Admitting: Nurse Practitioner

## 2020-07-26 DIAGNOSIS — G8929 Other chronic pain: Secondary | ICD-10-CM

## 2020-07-26 NOTE — Telephone Encounter (Signed)
Requested medication (s) are due for refill today: ?  Requested medication (s) are on the active medication list: No  Last refill:  04/01/20  Future visit scheduled: No  Notes to clinic:  Medication not on list.    Requested Prescriptions  Pending Prescriptions Disp Refills   acetaminophen-codeine (TYLENOL #3) 300-30 MG tablet [Pharmacy Med Name: Acetaminophen-Codeine #3 300-30 MG Oral Tablet] 60 tablet 0    Sig: TAKE 1 TO 2 TABLETS BY MOUTH EVERY 8 HOURS AS NEEDED FOR MODERATE PAIN      Not Delegated - Analgesics:  Opioid Agonist Combinations Failed - 07/26/2020  2:53 PM      Failed - This refill cannot be delegated      Failed - Urine Drug Screen completed in last 360 days      Passed - Valid encounter within last 6 months    Recent Outpatient Visits           3 months ago Mixed hyperlipidemia   [facility] [place], [place] W, NP   9 months ago Chronic bilateral low back pain with right-sided sciatica   [facility] [place], [place] W, NP   1 year ago Chronic bilateral low back pain with right-sided sciatica   [facility] [place], [place] W, NP   2 years ago Chronic bilateral low back pain with right-sided sciatica   [facility] [place], [place] W, NP   2 years ago Chronic bilateral low back pain with right-sided sciatica   [facility] [place], [place] Stakes, NP

## 2020-09-15 ENCOUNTER — Other Ambulatory Visit (HOSPITAL_COMMUNITY): Payer: Self-pay | Admitting: Psychiatry

## 2020-09-15 ENCOUNTER — Other Ambulatory Visit: Payer: Self-pay | Admitting: Nurse Practitioner

## 2020-09-15 DIAGNOSIS — F5101 Primary insomnia: Secondary | ICD-10-CM

## 2020-09-15 DIAGNOSIS — F331 Major depressive disorder, recurrent, moderate: Secondary | ICD-10-CM

## 2020-09-15 DIAGNOSIS — G8929 Other chronic pain: Secondary | ICD-10-CM

## 2020-09-15 DIAGNOSIS — F411 Generalized anxiety disorder: Secondary | ICD-10-CM

## 2020-09-15 NOTE — Telephone Encounter (Signed)
Requested medication (s) are due for refill today: yes  Requested medication (s) are on the active medication list: yes  Last refill:  07/26/20  Future visit scheduled: no  Notes to clinic:  med not delegated to NT to RF   Requested Prescriptions  Pending Prescriptions Disp Refills   acetaminophen-codeine (TYLENOL #3) 300-30 MG tablet [Pharmacy Med Name: Acetaminophen-Codeine #3 300-30 MG Oral Tablet] 60 tablet 0    Sig: TAKE 1 TO 2 TABLETS BY MOUTH EVERY 8 HOURS AS NEEDED FOR MODERATE PAIN      Not Delegated - Analgesics:  Opioid Agonist Combinations Failed - 09/15/2020  4:18 PM      Failed - This refill cannot be delegated      Failed - Urine Drug Screen completed in last 360 days      Passed - Valid encounter within last 6 months    Recent Outpatient Visits           4 months ago Mixed hyperlipidemia   [facility] [place], [place] W, NP   11 months ago Chronic bilateral low back pain with right-sided sciatica   [facility] [place], [place] W, NP   2 years ago Chronic bilateral low back pain with right-sided sciatica   [facility] [place], [place] W, NP   2 years ago Chronic bilateral low back pain with right-sided sciatica   [facility] [place], [place] W, NP   2 years ago Chronic bilateral low back pain with right-sided sciatica   [facility] [place], Shea Stakes, NP

## 2020-09-16 ENCOUNTER — Other Ambulatory Visit: Payer: Self-pay | Admitting: Nurse Practitioner

## 2020-09-16 DIAGNOSIS — L249 Irritant contact dermatitis, unspecified cause: Secondary | ICD-10-CM

## 2020-09-16 NOTE — Telephone Encounter (Signed)
Requested Prescriptions  Pending Prescriptions Disp Refills  . triamcinolone (KENALOG) 0.025 % ointment [Pharmacy Med Name: TRIAMCINOLONE ACETONIDE 0.025 % Ointment] 320 g 1    Sig: APPLY 1 APPLICATION TOPICALLY 2 (TWO) TIMES DAILY.     Dermatology:  Corticosteroids Passed - 09/16/2020  3:55 AM      Passed - Valid encounter within last 12 months    Recent Outpatient Visits          4 months ago Mixed hyperlipidemia   [facility] [place], [place] W, NP   11 months ago Chronic bilateral low back pain with right-sided sciatica   [facility] [place], [place] W, NP   2 years ago Chronic bilateral low back pain with right-sided sciatica   [facility] [place], [place] W, NP   2 years ago Chronic bilateral low back pain with right-sided sciatica   [facility] [place], [place] W, NP   2 years ago Chronic bilateral low back pain with right-sided sciatica   [facility] [place] Rigg, NP

## 2020-09-18 ENCOUNTER — Other Ambulatory Visit: Payer: Self-pay | Admitting: Nurse Practitioner

## 2020-09-27 ENCOUNTER — Ambulatory Visit (INDEPENDENT_AMBULATORY_CARE_PROVIDER_SITE_OTHER): Payer: Medicare HMO | Admitting: Psychiatry

## 2020-09-27 ENCOUNTER — Encounter (HOSPITAL_COMMUNITY): Payer: Self-pay | Admitting: Psychiatry

## 2020-09-27 ENCOUNTER — Other Ambulatory Visit: Payer: Self-pay

## 2020-09-27 DIAGNOSIS — F5101 Primary insomnia: Secondary | ICD-10-CM

## 2020-09-27 DIAGNOSIS — F331 Major depressive disorder, recurrent, moderate: Secondary | ICD-10-CM

## 2020-09-27 DIAGNOSIS — F411 Generalized anxiety disorder: Secondary | ICD-10-CM | POA: Diagnosis not present

## 2020-09-27 MED ORDER — NORTRIPTYLINE HCL 75 MG PO CAPS: 75.0000 mg | ORAL_CAPSULE | Freq: Every day | ORAL | 0 refills | Status: DC

## 2020-09-27 MED ORDER — TRAZODONE HCL 50 MG PO TABS: 50.0000 mg | ORAL_TABLET | Freq: Every evening | ORAL | 0 refills | Status: DC | PRN

## 2020-09-27 MED ORDER — PAROXETINE HCL 40 MG PO TABS
40.0000 mg | ORAL_TABLET | Freq: Every day | ORAL | 0 refills | Status: DC
Start: 2020-09-27 — End: 2020-12-27

## 2020-09-27 MED ORDER — ARIPIPRAZOLE 2 MG PO TABS: 2.0000 mg | ORAL_TABLET | Freq: Every day | ORAL | 0 refills | Status: DC

## 2020-09-27 NOTE — Progress Notes (Signed)
[facility]  Brent Taillon [id number] 50 y.o.  09/27/2020 2:30 PM  Chief Complaint:  I am doing better.  History of Present Illness:  Patient came for his follow-up appointment with his daughter Thora Lance who translated for him.  Patient speaks very little [place].  Patient feels comfortable with his daughter to translate.  I offered hospital translator but patient refused.  Overall patient reportedly has been doing good.  He denies any irritability, anger, crying spells or any mood swings.  Lately he has not been getting trazodone from the pharmacy and there are some nights that he has struggled with sleep.  But overall he feels his depression and anxiety is under control.  His appetite is okay.  He is walking regularly and he feels active and has more energy.  He checks his blood sugar regularly.  He denies any major panic attack.  He has mild tremors in his hand but it does not interfere in his daily activities.  He is taking nortriptyline, Paxil and Abilify.    Past Psychiatric History:  H/O [place] in 2008 when walked into traffic.  Tried Risperdal, Cymbalta and temazepam with poor outcome.  Seeing psychiatrist in our office since October 2018.   Medical History; Past Medical History: No date: Anxiety No date: Back pain with right-sided radiculopathy No date: Depression No date: High cholesterol   Outpatient Encounter Medications as of 09/27/2020  Medication Sig   acetaminophen-codeine (TYLENOL #3) 300-30 MG tablet TAKE 1 TO 2 TABLETS BY MOUTH EVERY 8 HOURS AS NEEDED FOR MODERATE PAIN   ARIPiprazole (ABILIFY) 2 MG tablet Take 1 tablet (2 mg total) by mouth daily.   aspirin 81 MG tablet Take 1 tablet (81 mg total) by mouth daily. (Patient not taking: Reported on 04/20/2020)   atorvastatin (LIPITOR) 40 MG tablet Take 1 tablet (40 mg total) by mouth daily.   fluticasone (FLONASE) 50 MCG/ACT nasal spray USE 1-2 SPRAYS INTO BOTH NOSTRILS DAILY.    icosapent Ethyl (VASCEPA) 1 g capsule Take 2 capsules (2 g total) by mouth 2 (two) times daily.   nortriptyline (PAMELOR) 75 MG capsule Take 1 capsule (75 mg total) by mouth at bedtime.   omeprazole (PRILOSEC) 40 MG capsule Take 1 capsule (40 mg total) by mouth daily.   PARoxetine (PAXIL) 40 MG tablet Take 1 tablet (40 mg total) by mouth daily.   traZODone (DESYREL) 50 MG tablet Take 1 tablet (50 mg total) by mouth at bedtime as needed for sleep.   triamcinolone (KENALOG) 0.025 % ointment APPLY 1 APPLICATION TOPICALLY 2 (TWO) TIMES DAILY.   [DISCONTINUED] ARIPiprazole (ABILIFY) 2 MG tablet Take 1 tablet (2 mg total) by mouth daily.   [DISCONTINUED] nortriptyline (PAMELOR) 75 MG capsule Take 1 capsule (75 mg total) by mouth at bedtime.   [DISCONTINUED] PARoxetine (PAXIL) 40 MG tablet Take 1 tablet (40 mg total) by mouth daily.   [DISCONTINUED] traZODone (DESYREL) 50 MG tablet Take 1 tablet (50 mg total) by mouth at bedtime.   No facility-administered encounter medications on file as of 09/27/2020.    No results found for this or any previous visit (from the past 2160 hour(s)).  Physical Exam: Consitutional ;  BP 126/87 (BP Location: Left Arm, Patient Position: Sitting, Cuff Size: Normal)   Pulse 97   Temp 98.6 F (37 C) (Oral)   Ht 5\' 8"  (1.727 m)   Wt 183 lb (83 kg)   SpO2 95%   BMI 27.83 kg/m    Psychiatric Specialty  Exam: General Appearance: Casual  Eye Contact::  Fair  Speech:  Slow and patient has language barrier  Volume:  Decreased  Mood:  Euthymic  Affect:  Congruent  Thought Process:  Goal Directed  Orientation:  Full (Time, Place, and Person)  Thought Content:  WDL  Suicidal Thoughts:  No  Homicidal Thoughts:  No  Memory:  Immediate;   Fair Recent;   Fair Remote;   Fair  Judgement:  Intact  Insight:  Present  Psychomotor Activity:  Tremor  Concentration:  Fair  Recall:  [employer] of Knowledge:  Fair  Language:  Fair  Akathisia:  No  Handed:  Right   AIMS (if indicated):     Assets:  Communication Skills Desire for Improvement Housing Resilience Social Support  ADL's:  Intact  Cognition:  WNL  Sleep:   fai   Assessment: Primary insomnia.  Major depressive disorder, recurrent.  Anxiety.   Plan:  Patient is a stable on his current medication.  I discussed sleep hygiene to help his insomnia.  Most of the nights he is able to sleep.  I recommend he can resume trazodone 50 mg only as needed as he is taking multiple medication and psychotropic medication can cause tremors.  Patient agreed with the plan.  We will provide trazodone 50 mg to take as needed for insomnia #40, continue nortriptyline 75 mg at bedtime, Abilify 2 mg daily and Paxil 40 mg daily.  Recommended to call us back if there is any question or any concern.  Follow-up in 3 months.    Cleotis Nipper, MD 09/27/2020

## 2020-11-14 ENCOUNTER — Other Ambulatory Visit (HOSPITAL_COMMUNITY): Payer: Self-pay | Admitting: Psychiatry

## 2020-11-14 DIAGNOSIS — F411 Generalized anxiety disorder: Secondary | ICD-10-CM

## 2020-11-14 DIAGNOSIS — F331 Major depressive disorder, recurrent, moderate: Secondary | ICD-10-CM

## 2020-11-14 DIAGNOSIS — F5101 Primary insomnia: Secondary | ICD-10-CM

## 2020-11-21 ENCOUNTER — Telehealth (HOSPITAL_COMMUNITY): Payer: Self-pay | Admitting: *Deleted

## 2020-11-21 DIAGNOSIS — F411 Generalized anxiety disorder: Secondary | ICD-10-CM

## 2020-11-21 DIAGNOSIS — F5101 Primary insomnia: Secondary | ICD-10-CM

## 2020-11-21 DIAGNOSIS — F331 Major depressive disorder, recurrent, moderate: Secondary | ICD-10-CM

## 2020-11-21 NOTE — Telephone Encounter (Signed)
I send the prescription.  

## 2020-11-21 NOTE — Telephone Encounter (Signed)
Received from pt pharmacy requesting refill of Trazodone 50 mg qhs prn. Pt last prescribed #40 on 09/27/20. Pt has an upcoming appointment on 12/27/20. Ok to fill?

## 2020-11-29 ENCOUNTER — Other Ambulatory Visit: Payer: Self-pay | Admitting: Nurse Practitioner

## 2020-11-29 DIAGNOSIS — R0981 Nasal congestion: Secondary | ICD-10-CM

## 2020-12-06 DIAGNOSIS — S82301K Unspecified fracture of lower end of right tibia, subsequent encounter for closed fracture with nonunion: Secondary | ICD-10-CM | POA: Diagnosis not present

## 2020-12-26 ENCOUNTER — Other Ambulatory Visit: Payer: Self-pay

## 2020-12-26 ENCOUNTER — Ambulatory Visit: Payer: Medicare HMO | Attending: Nurse Practitioner | Admitting: Nurse Practitioner

## 2020-12-26 ENCOUNTER — Encounter: Payer: Self-pay | Admitting: Nurse Practitioner

## 2020-12-26 DIAGNOSIS — E782 Mixed hyperlipidemia: Secondary | ICD-10-CM | POA: Diagnosis not present

## 2020-12-26 DIAGNOSIS — G8929 Other chronic pain: Secondary | ICD-10-CM

## 2020-12-26 DIAGNOSIS — M5441 Lumbago with sciatica, right side: Secondary | ICD-10-CM

## 2020-12-26 DIAGNOSIS — Z1211 Encounter for screening for malignant neoplasm of colon: Secondary | ICD-10-CM

## 2020-12-26 DIAGNOSIS — R748 Abnormal levels of other serum enzymes: Secondary | ICD-10-CM | POA: Diagnosis not present

## 2020-12-26 DIAGNOSIS — K219 Gastro-esophageal reflux disease without esophagitis: Secondary | ICD-10-CM | POA: Diagnosis not present

## 2020-12-26 MED ORDER — ACETAMINOPHEN-CODEINE #3 300-30 MG PO TABS: 1.0000 | ORAL_TABLET | Freq: Three times a day (TID) | ORAL | 0 refills | Status: AC | PRN

## 2020-12-26 MED ORDER — ATORVASTATIN CALCIUM 40 MG PO TABS: 40.0000 mg | ORAL_TABLET | Freq: Every day | ORAL | 1 refills | Status: DC

## 2020-12-26 MED ORDER — OMEPRAZOLE 40 MG PO CPDR: 40.0000 mg | DELAYED_RELEASE_CAPSULE | Freq: Every day | ORAL | 1 refills | Status: DC

## 2020-12-26 NOTE — Progress Notes (Signed)
Virtual Visit via Telephone Note Due to national recommendations of social distancing due to [place] 19, telehealth visit is felt to be most appropriate for this patient at this time.  I discussed the limitations, risks, security and privacy concerns of performing an evaluation and management service by telephone and the availability of in person appointments. I also discussed with the patient that there may be a patient responsible charge related to this service. The patient expressed understanding and agreed to proceed.    I connected with Joshua [name] on 12/26/20  at   1:50 PM EDT  EDT by telephone and verified that I am speaking with the correct person using two identifiers.  Location of Patient: Private Residence   Location of Provider: [place] participating in Telemedicine visit: Geryl Rankins FNP-BC [place] interpreter Clifton James ID number [id number]   History of Present Illness: Telemedicine visit for: Back pain  He has back pain with right-sided radiculopathy.  Was being followed by Dr. Letta Pate with PMR several years ago and was receiving steroid spinal injections.  He has a surgical history of L5-S1 transforaminal lumbar epidural steroid injection on 10/16/2017.  He has tried physical therapy in the past without significant improvement and currently uses a cane to assist with ambulation.   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 At this time he will need to be referred back to physical medicine and rehab as he reports worsening back pain.  Denies any involuntary loss of urine or stool.  I have courtesy filled his Tylenol 3 however he should follow-up with Dr. Letta Pate for more appropriate pain management.   Past Medical History:  Diagnosis Date   Anxiety    Back pain with right-sided radiculopathy    Depression    High cholesterol      History reviewed. No pertinent surgical history.  Family History  Problem Relation Age of Onset   Hypertension Paternal Uncle    Diabetes Paternal Uncle     Social History   Socioeconomic History   Marital status: Married    Spouse name: Not on file   Number of children: Not on file   Years of education: Not on file   Highest education level: Not on file  Occupational History   Not on file  Tobacco Use   Smoking status: Never   Smokeless tobacco: Never  Vaping Use   Vaping Use: Never used  Substance and Sexual Activity   Alcohol use: No   Drug use: No   Sexual activity: Yes    Birth control/protection: None  Other Topics Concern   Not on file  Social History Narrative   Not on file   Social Determinants of Health   Financial Resource Strain: Not on file  Food Insecurity: Not on file  Transportation Needs: Not on file  Physical Activity: Not on file  Stress: Not on file  Social Connections: Not on file     Observations/Objective: Awake, alert and oriented x 3   Review of Systems  Constitutional:  Negative for fever, malaise/fatigue and weight loss.  HENT: Negative.  Negative for nosebleeds.   Eyes: Negative.  Negative for blurred vision, double vision and photophobia.  Respiratory: Negative.  Negative for cough and shortness of breath.   Cardiovascular: Negative.  Negative for chest pain, palpitations and leg swelling.  Gastrointestinal: Negative.  Negative for heartburn, nausea and vomiting.  Musculoskeletal:  Positive for back pain and myalgias.  Neurological: Negative.  Negative for dizziness, focal weakness, seizures and headaches.  Psychiatric/Behavioral:  Negative for suicidal ideas. The patient is nervous/anxious.    Assessment and Plan: Diagnoses and all orders for this visit:  Chronic bilateral low back pain with right-sided sciatica -     acetaminophen-codeine (TYLENOL #3) 300-30 MG tablet; Take 1-2 tablets by mouth every 8 (eight) hours as needed  for moderate pain. NO ADDITIONAL REFILLS. NEEDS TO FOLLOW UP WITH PHYSICAL MEDICINE -     omeprazole (PRILOSEC) 40 MG capsule; Take 1 capsule (40 mg total) by mouth daily. -     CBC; Future Work on losing weight to help reduce back pain. May alternate with heat and ice application for pain relief. May also alternate with acetaminophen and Ibuprofen as prescribed for back pain. Other alternatives include massage, acupuncture and water aerobics.  You must stay active and avoid a sedentary lifestyle.    Mixed hyperlipidemia -     atorvastatin (LIPITOR) 40 MG tablet; Take 1 tablet (40 mg total) by mouth daily. -     Lipid panel; Future INSTRUCTIONS: Work on a low fat, heart healthy diet and participate in regular aerobic exercise program by working out at least 150 minutes per week; 5 days a week-30 minutes per day. Avoid red meat/beef/steak,  fried foods. junk foods, sodas, sugary drinks, unhealthy snacking, alcohol and smoking.  Drink at least 80 oz of water per day and monitor your carbohydrate intake daily.    GERD without esophagitis -     omeprazole (PRILOSEC) 40 MG capsule; Take 1 capsule (40 mg total) by mouth daily. INSTRUCTIONS: Avoid GERD Triggers: acidic, spicy or fried foods, caffeine, coffee, sodas,  alcohol and chocolate.    Elevated liver enzymes -     CMP14+EGFR; Future -     CBC; Future  Colon cancer screening -     Ambulatory referral to Gastroenterology    Follow Up Instructions Return in about 3 months (around 03/27/2021).     I discussed the assessment and treatment plan with the patient. The patient was provided an opportunity to ask questions and all were answered. The patient agreed with the plan and demonstrated an understanding of the instructions.   The patient was advised to call back or seek an in-person evaluation if the symptoms worsen or if the condition fails to improve as anticipated.  I provided 15 minutes of non-face-to-face time during this encounter  including median intraservice time, reviewing previous notes, labs, imaging, medications and explaining diagnosis and management.  Gildardo Pounds, FNP-BC

## 2020-12-27 ENCOUNTER — Telehealth (INDEPENDENT_AMBULATORY_CARE_PROVIDER_SITE_OTHER): Payer: Medicare HMO | Admitting: Psychiatry

## 2020-12-27 ENCOUNTER — Encounter (HOSPITAL_COMMUNITY): Payer: Self-pay | Admitting: Psychiatry

## 2020-12-27 DIAGNOSIS — F411 Generalized anxiety disorder: Secondary | ICD-10-CM

## 2020-12-27 DIAGNOSIS — F5101 Primary insomnia: Secondary | ICD-10-CM | POA: Diagnosis not present

## 2020-12-27 DIAGNOSIS — F331 Major depressive disorder, recurrent, moderate: Secondary | ICD-10-CM | POA: Diagnosis not present

## 2020-12-27 MED ORDER — ARIPIPRAZOLE 2 MG PO TABS: 2.0000 mg | ORAL_TABLET | Freq: Every day | ORAL | 0 refills | Status: DC

## 2020-12-27 MED ORDER — PAROXETINE HCL 40 MG PO TABS: 40.0000 mg | ORAL_TABLET | Freq: Every day | ORAL | 0 refills | Status: DC

## 2020-12-27 MED ORDER — TRAZODONE HCL 50 MG PO TABS: 50.0000 mg | ORAL_TABLET | Freq: Every evening | ORAL | 0 refills | Status: DC | PRN

## 2020-12-27 MED ORDER — NORTRIPTYLINE HCL 75 MG PO CAPS: 75.0000 mg | ORAL_CAPSULE | Freq: Every day | ORAL | 0 refills | Status: DC

## 2020-12-27 NOTE — Progress Notes (Signed)
Virtual Visit via Telephone Note  I connected with Joshua [name] on 12/27/20 at  3:40 PM EDT by telephone and verified that I am speaking with the correct person using two identifiers.  Location: Patient: Home Provider: Office   I discussed the limitations, risks, security and privacy concerns of performing an evaluation and management service by telephone and the availability of in person appointments. I also discussed with the patient that there may be a patient responsible charge related to this service. The patient expressed understanding and agreed to proceed.   History of Present Illness: Patient is evaluated by phone session.  His son translated for him and patient was comfortable with the translator and he does not want hospital translator.  Patient is taking trazodone every night because he tried not to take some nights and did not sleep well.  He denies irritability anger, mania or any psychosis.  Recently had a visit with his PCP and blood work is scheduled but no new medication added.  He denies any panic attack.  He denies any suicidal thoughts.  His appetite is okay and his weight is stable.  He does walk regularly and he feels that helps.  He has mild tremors but it does not interfere in his daily activities.  Past Psychiatric History:  H/O inpatient in [place] in 2008 when walked into traffic.  Tried Risperdal, Cymbalta and temazepam with poor outcome.  Seeing psychiatrist in our office since October 2018  Psychiatric Specialty Exam: Physical Exam  Review of Systems  Weight 183 lb (83 kg).There is no height or weight on file to calculate BMI.  General Appearance: NA  Eye Contact:  NA  Speech:  Slow  Volume:  Decreased  Mood:  Dysphoric  Affect:  NA  Thought Process:  Goal Directed  Orientation:  Full (Time, Place, and Person)  Thought Content:  WDL  Suicidal Thoughts:  No  Homicidal Thoughts:  No  Memory:  Immediate;   Fair Recent;   Fair Remote;   Fair   Judgement:  Fair  Insight:  Shallow  Psychomotor Activity:  NA  Concentration:  Concentration: Fair and Attention Span: Fair  Recall:  [employer] of Knowledge:  Fair  Language:  Fair  Akathisia:  No  Handed:  Right  AIMS (if indicated):     Assets:  Desire for Improvement Housing Social Support  ADL's:  Intact  Cognition:  WNL  Sleep:   fair     Assessment and Plan: Primary insomnia.  Major depressive disorder, recurrent.  Anxiety.  Patient taking trazodone every night as he tried to take as needed but some nights when he does not take it does not help his sleep.  We will change trazodone 50 mg to take every night and continue nortriptyline 75 mg at bedtime, Abilify 2 mg daily and Paxil 40 mg daily.  Encouraged to keep appointment with his PCP and had done blood work.  We will follow up in 3 months with the help of translator.  Recommended to call us back if is any question or any concern.  Follow-up in 3 months.  Follow Up Instructions:    I discussed the assessment and treatment plan with the patient. The patient was provided an opportunity to ask questions and all were answered. The patient agreed with the plan and demonstrated an understanding of the instructions.   The patient was advised to call back or seek an in-person evaluation if the symptoms worsen or if the condition fails  to improve as anticipated.  I provided 13 minutes of non-face-to-face time during this encounter.   Kathlee Nations, MD

## 2020-12-31 ENCOUNTER — Ambulatory Visit: Payer: Medicare HMO | Attending: Nurse Practitioner

## 2020-12-31 ENCOUNTER — Other Ambulatory Visit: Payer: Self-pay

## 2020-12-31 DIAGNOSIS — E782 Mixed hyperlipidemia: Secondary | ICD-10-CM | POA: Diagnosis not present

## 2020-12-31 DIAGNOSIS — R748 Abnormal levels of other serum enzymes: Secondary | ICD-10-CM | POA: Diagnosis not present

## 2020-12-31 DIAGNOSIS — G8929 Other chronic pain: Secondary | ICD-10-CM

## 2020-12-31 DIAGNOSIS — M5441 Lumbago with sciatica, right side: Secondary | ICD-10-CM | POA: Diagnosis not present

## 2021-01-01 LAB — CMP14+EGFR
ALT: 76 IU/L — ABNORMAL HIGH (ref 0–44)
AST: 32 IU/L (ref 0–40)
Albumin/Globulin Ratio: 2.1 (ref 1.2–2.2)
Albumin: 4.9 g/dL (ref 4.0–5.0)
Alkaline Phosphatase: 123 IU/L — ABNORMAL HIGH (ref 44–121)
BUN/Creatinine Ratio: 15 (ref 9–20)
BUN: 15 mg/dL (ref 6–24)
Bilirubin Total: 0.5 mg/dL (ref 0.0–1.2)
CO2: 25 mmol/L (ref 20–29)
Calcium: 9.7 mg/dL (ref 8.7–10.2)
Chloride: 101 mmol/L (ref 96–106)
Creatinine, Ser: 0.97 mg/dL (ref 0.76–1.27)
Globulin, Total: 2.3 g/dL (ref 1.5–4.5)
Glucose: 93 mg/dL (ref 65–99)
Potassium: 4.8 mmol/L (ref 3.5–5.2)
Sodium: 141 mmol/L (ref 134–144)
Total Protein: 7.2 g/dL (ref 6.0–8.5)
eGFR: 96 mL/min/{1.73_m2} (ref >59)

## 2021-01-01 LAB — CBC
Hematocrit: 46.4 % (ref 37.5–51.0)
Hemoglobin: 15.7 g/dL (ref 13.0–17.7)
MCH: 29.4 pg (ref 26.6–33.0)
MCHC: 33.8 g/dL (ref 31.5–35.7)
MCV: 87 fL (ref 79–97)
Platelets: 313 10*3/uL (ref 150–450)
RBC: 5.34 x10E6/uL (ref 4.14–5.80)
RDW: 12.6 % (ref 11.6–15.4)
WBC: 11.2 10*3/uL — ABNORMAL HIGH (ref 3.4–10.8)

## 2021-01-01 LAB — LIPID PANEL
Chol/HDL Ratio: 7.1 ratio — ABNORMAL HIGH (ref 0.0–5.0)
Cholesterol, Total: 240 mg/dL — ABNORMAL HIGH (ref 100–199)
HDL: 34 mg/dL — ABNORMAL LOW (ref >39)
LDL Chol Calc (NIH): 167 mg/dL — ABNORMAL HIGH (ref 0–99)
Triglycerides: 209 mg/dL — ABNORMAL HIGH (ref 0–149)
VLDL Cholesterol Cal: 39 mg/dL (ref 5–40)

## 2021-01-04 ENCOUNTER — Ambulatory Visit (HOSPITAL_BASED_OUTPATIENT_CLINIC_OR_DEPARTMENT_OTHER): Payer: Medicare HMO

## 2021-01-04 DIAGNOSIS — Z0000 Encounter for general adult medical examination without abnormal findings: Secondary | ICD-10-CM | POA: Diagnosis not present

## 2021-01-04 NOTE — Progress Notes (Signed)
Subjective:   Joshua [name] is a 50 y.o. male who presents for an Initial Medicare Annual Wellness Visit.I connected with  Joshua [name] on 01/04/21 by a audio enabled telemedicine application and verified that I am speaking with the correct person using two identifiers.   I discussed the limitations of evaluation and management by telemedicine. The patient expressed understanding and agreed to proceed.   Location of patient: Home Location of provider: Office  Persons participating in visit: Joshua [name] (patient) and Donnetta Hail, CMA  Review of Systems    Defer to PCP       Objective:    Today's Vitals   01/04/21 1719  PainSc: 6    There is no height or weight on file to calculate BMI.  Advanced Directives 01/04/2021 06/04/2017 04/13/2017 09/22/2016 07/18/2016  Does Patient Have a Medical Advance Directive? No No No No No  Would patient like information on creating a medical advance directive? - No - Patient declined No - Patient declined No - Patient declined -    Current Medications (verified) Outpatient Encounter Medications as of 01/04/2021  Medication Sig   acetaminophen-codeine (TYLENOL #3) 300-30 MG tablet Take 1-2 tablets by mouth every 8 (eight) hours as needed for moderate pain. NO ADDITIONAL REFILLS. NEEDS TO FOLLOW UP WITH PHYSICAL MEDICINE   ARIPiprazole (ABILIFY) 2 MG tablet Take 1 tablet (2 mg total) by mouth daily.   atorvastatin (LIPITOR) 40 MG tablet Take 1 tablet (40 mg total) by mouth daily.   fluticasone (FLONASE) 50 MCG/ACT nasal spray USE 1-2 SPRAYS INTO BOTH NOSTRILS DAILY.   nortriptyline (PAMELOR) 75 MG capsule Take 1 capsule (75 mg total) by mouth at bedtime.   omeprazole (PRILOSEC) 40 MG capsule Take 1 capsule (40 mg total) by mouth daily.   PARoxetine (PAXIL) 40 MG tablet Take 1 tablet (40 mg total) by mouth daily.   traZODone (DESYREL) 50 MG tablet Take 1 tablet (50 mg total) by mouth at bedtime as needed. for sleep   triamcinolone  (KENALOG) 0.025 % ointment APPLY 1 APPLICATION TOPICALLY 2 (TWO) TIMES DAILY.   aspirin 81 MG tablet Take 1 tablet (81 mg total) by mouth daily. (Patient not taking: No sig reported)   icosapent Ethyl (VASCEPA) 1 g capsule Take 2 capsules (2 g total) by mouth 2 (two) times daily.   No facility-administered encounter medications on file as of 01/04/2021.    Allergies (verified) Flexeril [cyclobenzaprine]   History: Past Medical History:  Diagnosis Date   Anxiety    Back pain with right-sided radiculopathy    Depression    High cholesterol    History reviewed. No pertinent surgical history. Family History  Problem Relation Age of Onset   Hypertension Paternal Uncle    Diabetes Paternal Uncle    Social History   Socioeconomic History   Marital status: Married    Spouse name: Not on file   Number of children: Not on file   Years of education: Not on file   Highest education level: Not on file  Occupational History   Not on file  Tobacco Use   Smoking status: Never   Smokeless tobacco: Never  Vaping Use   Vaping Use: Never used  Substance and Sexual Activity   Alcohol use: No   Drug use: No   Sexual activity: Yes    Birth control/protection: None  Other Topics Concern   Not on file  Social History Narrative   Not on file   Social Determinants of Health  Financial Resource Strain: High Risk   Difficulty of Paying Living Expenses: Very hard  Food Insecurity: Geophysicist/field seismologist Present   Worried About Programme researcher, broadcasting/film/video in the Last Year: Often true   Barista in the Last Year: Often true  Transportation Needs: No Transportation Needs   Lack of Transportation (Medical): No   Lack of Transportation (Non-Medical): No  Physical Activity: Insufficiently Active   Days of Exercise per Week: 7 days   Minutes of Exercise per Session: 20 min  Stress: Stress Concern Present   Feeling of Stress : Very much  Social Connections: Moderately Integrated   Frequency of  Communication with Friends and Family: Once a week   Frequency of Social Gatherings with Friends and Family: More than three times a week   Attends Religious Services: More than 4 times per year   Active Member of [employer] or Organizations: No   Attends Engineer, structural: Never   Marital Status: Married    Tobacco Counseling Counseling given: Not Answered   Clinical Intake:  Pre-visit preparation completed: Yes  Pain : 0-10 Pain Score: 6  Pain Location: Back Pain Onset: More than a month ago Pain Frequency: Constant     Diabetes: No  How often do you need to have someone help you when you read instructions, pamphlets, or other written materials from your doctor or pharmacy?: 5 - Always What is the last grade level you completed in school?: 12th grade  Diabetic?No  Interpreter Needed?: No  Information entered by :: Donnetta Hail, CMA   Activities of Daily Living In your present state of health, do you have any difficulty performing the following activities: 01/04/2021  Hearing? N  Vision? N  Difficulty concentrating or making decisions? Y  Walking or climbing stairs? Y  Dressing or bathing? Y  Doing errands, shopping? Y  Preparing Food and eating ? Y  Using the Toilet? N  In the past six months, have you accidently leaked urine? N  Do you have problems with loss of bowel control? N  Managing your Medications? Y  Managing your Finances? Y  Housekeeping or managing your Housekeeping? N  Some recent data might be hidden    Patient Care Team: Claiborne Rigg, NP as PCP - General (Nurse Practitioner)  Indicate any recent Medical Services you may have received from other than [facility] providers in the past year (date may be approximate).     Assessment:   This is a routine wellness examination for [place].  Hearing/Vision screen No results found.  Dietary issues and exercise activities discussed:     Goals Addressed   None    Depression  Screen PHQ 2/9 Scores 01/04/2021 04/20/2020 10/19/2019 07/30/2018 02/22/2018 02/22/2018 11/20/2017  PHQ - 2 Score 4 4 4 2 4  0 -  PHQ- 9 Score 15 17 15 13 16 3  -  Exception Documentation - - - - - - Patient refusal    Fall Risk Fall Risk  01/04/2021 10/19/2019 11/26/2017 09/24/2017  Falls in the past year? 0 - No No  Number falls in past yr: 0 0 - -  Injury with Fall? 0 1 - -  Risk for fall due to : No Fall Risks - - -  Follow up Falls evaluation completed - - -    FALL RISK PREVENTION PERTAINING TO THE HOME:  Any stairs in or around the home? Yes  If so, are there any without handrails? No  Home free of loose throw  rugs in walkways, pet beds, electrical cords, etc? Yes  Adequate lighting in your home to reduce risk of falls? Yes   ASSISTIVE DEVICES UTILIZED TO PREVENT FALLS:  Life alert? No  Use of a cane, walker or w/c? Yes  Grab bars in the bathroom? Yes  Shower chair or bench in shower? Yes  Elevated toilet seat or a handicapped toilet? No   TIMED UP AND GO:  Was the test performed?  N/A .  Length of time to ambulate 10 feet: N/A sec.    Cognitive Function:     6CIT Screen 01/04/2021  What Year? 0 points  What month? 0 points  What time? 0 points  Count back from 20 0 points  Months in reverse 4 points  Repeat phrase 0 points  Total Score 4    Immunizations  There is no immunization history on file for this patient.  TDAP status: Due, Education has been provided regarding the importance of this vaccine. Advised may receive this vaccine at local pharmacy or Health Dept. Aware to provide a copy of the vaccination record if obtained from local pharmacy or Health Dept. Verbalized acceptance and understanding.  Flu Vaccine status: Due, Education has been provided regarding the importance of this vaccine. Advised may receive this vaccine at local pharmacy or Health Dept. Aware to provide a copy of the vaccination record if obtained from local pharmacy or Health Dept. Verbalized  acceptance and understanding.   Qualifies for Shingles Vaccine?  N/A   Zostavax completed  N/A     Screening Tests Health Maintenance  Topic Date Due   TETANUS/TDAP  Never done   COLONOSCOPY (Pts 45-27yrs Insurance coverage will need to be confirmed)  Never done   INFLUENZA VACCINE  Never done   Hepatitis C Screening  Completed   HIV Screening  Completed   HPV VACCINES  Aged Out    Health Maintenance  Health Maintenance Due  Topic Date Due   TETANUS/TDAP  Never done   COLONOSCOPY (Pts 45-61yrs Insurance coverage will need to be confirmed)  Never done   INFLUENZA VACCINE  Never done    Colorectal cancer screening: Referral to GI placed Overdue, never done. Pt aware the office will call re: appt.  Lung Cancer Screening: (Low Dose CT Chest recommended if Age 46-80 years, 30 pack-year currently smoking OR have quit w/in 15years.) does not qualify.   Lung Cancer Screening Referral: No  Additional Screening:  Hepatitis C Screening: does qualify; Completed 05/01/2017  Vision Screening: Recommended annual ophthalmology exams for early detection of glaucoma and other disorders of the eye. Is the patient up to date with their annual eye exam?  No  Who is the provider or what is the name of the office in which the patient attends annual eye exams? N/A If pt is not established with a provider, would they like to be referred to a provider to establish care? No .   Dental Screening: Recommended annual dental exams for proper oral hygiene  Community Resource Referral / Chronic Care Management: CRR required this visit?  No   CCM required this visit?  No      Plan:     I have personally reviewed and noted the following in the patient's chart:   Medical and social history Use of alcohol, tobacco or illicit drugs  Current medications and supplements including opioid prescriptions. Patient is currently taking opioid prescriptions. Information provided to patient regarding  non-opioid alternatives. Patient advised to discuss non-opioid treatment plan  with their provider. Functional ability and status Nutritional status Physical activity Advanced directives List of other physicians Hospitalizations, surgeries, and ER visits in previous 12 months Vitals Screenings to include cognitive, depression, and falls Referrals and appointments  In addition, I have reviewed and discussed with patient certain preventive protocols, quality metrics, and best practice recommendations. A written personalized care plan for preventive services as well as general preventive health recommendations were provided to patient.     Donnetta Hail, [facility]   01/04/2021   Nurse Notes: Non-Face to Face 60 minute visit Encounter.  Mr. Tiedt , Thank you for taking time to come for your Medicare Wellness Visit. I appreciate your ongoing commitment to your health goals. Please review the following plan we discussed and let me know if I can assist you in the future.   These are the goals we discussed:  Goals   None     This is a list of the screening recommended for you and due dates:  Health Maintenance  Topic Date Due   Tetanus Vaccine  Never done   Colon Cancer Screening  Never done   Flu Shot  Never done   Hepatitis C Screening: USPSTF Recommendation to screen - Ages 31-79 yo.  Completed   HIV Screening  Completed   HPV Vaccine  Aged Out

## 2021-01-04 NOTE — Patient Instructions (Signed)
Health Maintenance, Male Adopting a healthy lifestyle and getting preventive care are important in promoting health and wellness. Ask your health care provider about: The right schedule for you to have regular tests and exams. Things you can do on your own to prevent diseases and keep yourself healthy. What should I know about diet, weight, and exercise? Eat a healthy diet  Eat a diet that includes plenty of vegetables, fruits, low-fat dairy products, and lean protein. Do not eat a lot of foods that are high in solid fats, added sugars, or sodium. Maintain a healthy weight Body mass index (BMI) is a measurement that can be used to identify possible weight problems. It estimates body fat based on height and weight. Your health care provider can help determine your BMI and help you achieve or maintain a healthy weight. Get regular exercise Get regular exercise. This is one of the most important things you can do for your health. Most adults should: Exercise for at least 150 minutes each week. The exercise should increase your heart rate and make you sweat (moderate-intensity exercise). Do strengthening exercises at least twice a week. This is in addition to the moderate-intensity exercise. Spend less time sitting. Even light physical activity can be beneficial. Watch cholesterol and blood lipids Have your blood tested for lipids and cholesterol at 49 years of age, then have this test every 5 years. You may need to have your cholesterol levels checked more often if: Your lipid or cholesterol levels are high. You are older than 49 years of age. You are at high risk for heart disease. What should I know about cancer screening? Many types of cancers can be detected early and may often be prevented. Depending on your health history and family history, you may need to have cancer screening at various ages. This may include screening for: Colorectal cancer. Prostate cancer. Skin cancer. Lung  cancer. What should I know about heart disease, diabetes, and high blood pressure? Blood pressure and heart disease High blood pressure causes heart disease and increases the risk of stroke. This is more likely to develop in people who have high blood pressure readings, are of African descent, or are overweight. Talk with your health care provider about your target blood pressure readings. Have your blood pressure checked: Every 3-5 years if you are 18-39 years of age. Every year if you are 40 years old or older. If you are between the ages of 65 and 75 and are a current or former smoker, ask your health care provider if you should have a one-time screening for abdominal aortic aneurysm (AAA). Diabetes Have regular diabetes screenings. This checks your fasting blood sugar level. Have the screening done: Once every three years after age 45 if you are at a normal weight and have a low risk for diabetes. More often and at a younger age if you are overweight or have a high risk for diabetes. What should I know about preventing infection? Hepatitis B If you have a higher risk for hepatitis B, you should be screened for this virus. Talk with your health care provider to find out if you are at risk for hepatitis B infection. Hepatitis C Blood testing is recommended for: Everyone born from 1945 through 1965. Anyone with known risk factors for hepatitis C. Sexually transmitted infections (STIs) You should be screened each year for STIs, including gonorrhea and chlamydia, if: You are sexually active and are younger than 49 years of age. You are older than 49 years   of age and your health care provider tells you that you are at risk for this type of infection. Your sexual activity has changed since you were last screened, and you are at increased risk for chlamydia or gonorrhea. Ask your health care provider if you are at risk. Ask your health care provider about whether you are at high risk for HIV.  Your health care provider may recommend a prescription medicine to help prevent HIV infection. If you choose to take medicine to prevent HIV, you should first get tested for HIV. You should then be tested every 3 months for as long as you are taking the medicine. Follow these instructions at home: Lifestyle Do not use any products that contain nicotine or tobacco, such as cigarettes, e-cigarettes, and chewing tobacco. If you need help quitting, ask your health care provider. Do not use street drugs. Do not share needles. Ask your health care provider for help if you need support or information about quitting drugs. Alcohol use Do not drink alcohol if your health care provider tells you not to drink. If you drink alcohol: Limit how much you have to 0-2 drinks a day. Be aware of how much alcohol is in your drink. In the [place] one drink equals one 12 oz bottle of beer (355 mL), one 5 oz glass of wine (148 mL), or one 1 oz glass of hard liquor (44 mL). General instructions Schedule regular health, dental, and eye exams. Stay current with your vaccines. Tell your health care provider if: You often feel depressed. You have ever been abused or do not feel safe at home. Summary Adopting a healthy lifestyle and getting preventive care are important in promoting health and wellness. Follow your health care provider's instructions about healthy diet, exercising, and getting tested or screened for diseases. Follow your health care provider's instructions on monitoring your cholesterol and blood pressure. This information is not intended to replace advice given to you by your health care provider. Make sure you discuss any questions you have with your health care provider. Document Revised: 06/08/2020 Document Reviewed: 03/24/2018 Elsevier Patient Education  2022 Elsevier Inc.  

## 2021-01-14 DIAGNOSIS — S82301K Unspecified fracture of lower end of right tibia, subsequent encounter for closed fracture with nonunion: Secondary | ICD-10-CM | POA: Diagnosis not present

## 2021-01-14 DIAGNOSIS — T85848S Pain due to other internal prosthetic devices, implants and grafts, sequela: Secondary | ICD-10-CM | POA: Diagnosis not present

## 2021-02-08 DIAGNOSIS — T8484XA Pain due to internal orthopedic prosthetic devices, implants and grafts, initial encounter: Secondary | ICD-10-CM | POA: Diagnosis not present

## 2021-02-08 DIAGNOSIS — T85848D Pain due to other internal prosthetic devices, implants and grafts, subsequent encounter: Secondary | ICD-10-CM | POA: Diagnosis not present

## 2021-03-22 ENCOUNTER — Telehealth (HOSPITAL_COMMUNITY): Payer: Medicare HMO | Admitting: Psychiatry

## 2021-03-26 DIAGNOSIS — S82301K Unspecified fracture of lower end of right tibia, subsequent encounter for closed fracture with nonunion: Secondary | ICD-10-CM | POA: Diagnosis not present

## 2021-03-27 ENCOUNTER — Telehealth (HOSPITAL_COMMUNITY): Payer: Medicare HMO | Admitting: Psychiatry

## 2021-04-25 ENCOUNTER — Other Ambulatory Visit: Payer: Self-pay | Admitting: Nurse Practitioner

## 2021-04-25 DIAGNOSIS — L249 Irritant contact dermatitis, unspecified cause: Secondary | ICD-10-CM

## 2021-04-25 NOTE — Telephone Encounter (Signed)
Requested Prescriptions  Pending Prescriptions Disp Refills   triamcinolone (KENALOG) 0.025 % ointment [Pharmacy Med Name: TRIAMCINOLONE ACETONIDE 0.025 % Ointment] 320 g 1    Sig: APPLY 1 APPLICATION TOPICALLY 2 (TWO) TIMES DAILY.     Dermatology:  Corticosteroids Passed - 04/25/2021  8:00 AM      Passed - Valid encounter within last 12 months    Recent Outpatient Visits          4 months ago Chronic bilateral low back pain with right-sided sciatica   [facility] [place], Shea Stakes, NP   1 year ago Mixed hyperlipidemia   [facility] [place], [place] W, NP   1 year ago Chronic bilateral low back pain with right-sided sciatica   [facility] [place], [place] W, NP   2 years ago Chronic bilateral low back pain with right-sided sciatica   [facility] [place], [place] W, NP   3 years ago Chronic bilateral low back pain with right-sided sciatica   [facility] Claiborne Rigg, NP

## 2021-04-29 ENCOUNTER — Other Ambulatory Visit: Payer: Self-pay | Admitting: Nurse Practitioner

## 2021-04-29 DIAGNOSIS — M5441 Lumbago with sciatica, right side: Secondary | ICD-10-CM

## 2021-04-29 DIAGNOSIS — G8929 Other chronic pain: Secondary | ICD-10-CM

## 2021-04-29 DIAGNOSIS — K219 Gastro-esophageal reflux disease without esophagitis: Secondary | ICD-10-CM

## 2021-04-29 DIAGNOSIS — E782 Mixed hyperlipidemia: Secondary | ICD-10-CM

## 2021-04-29 NOTE — Telephone Encounter (Signed)
Requested Prescriptions  Pending Prescriptions Disp Refills   atorvastatin (LIPITOR) 40 MG tablet [Pharmacy Med Name: ATORVASTATIN CALCIUM 40 MG Tablet] 90 tablet 1    Sig: TAKE 1 TABLET BY MOUTH DAILY.     Cardiovascular:  Antilipid - Statins Failed - 04/29/2021 10:20 AM      Failed - Total Cholesterol in normal range and within 360 days    Cholesterol, Total  Date Value Ref Range Status  12/31/2020 240 (H) 100 - 199 mg/dL Final         Failed - LDL in normal range and within 360 days    LDL Chol Calc (NIH)  Date Value Ref Range Status  12/31/2020 167 (H) 0 - 99 mg/dL Final         Failed - HDL in normal range and within 360 days    HDL  Date Value Ref Range Status  12/31/2020 34 (L) >39 mg/dL Final         Failed - Triglycerides in normal range and within 360 days    Triglycerides  Date Value Ref Range Status  12/31/2020 209 (H) 0 - 149 mg/dL Final         Passed - Patient is not pregnant      Passed - Valid encounter within last 12 months    Recent Outpatient Visits          4 months ago Chronic bilateral low back pain with right-sided sciatica   [facility] [place], Shea Stakes, NP   1 year ago Mixed hyperlipidemia   [facility] [place], [place] W, NP   1 year ago Chronic bilateral low back pain with right-sided sciatica   [facility] [place], [place] W, NP   2 years ago Chronic bilateral low back pain with right-sided sciatica   [facility] [place], [place] W, NP   3 years ago Chronic bilateral low back pain with right-sided sciatica   [facility] [place], [place] W, NP              omeprazole (PRILOSEC) 40 MG capsule [Pharmacy Med Name: OMEPRAZOLE 40 MG Capsule Delayed Release] 90 capsule 1    Sig: TAKE 1 CAPSULE BY MOUTH DAILY.     Gastroenterology: Proton Pump Inhibitors Passed - 04/29/2021 10:20 AM      Passed -  Valid encounter within last 12 months    Recent Outpatient Visits          4 months ago Chronic bilateral low back pain with right-sided sciatica   [facility] [place], Shea Stakes, NP   1 year ago Mixed hyperlipidemia   [facility] [place], [place] W, NP   1 year ago Chronic bilateral low back pain with right-sided sciatica   [facility] [place], [place] W, NP   2 years ago Chronic bilateral low back pain with right-sided sciatica   [facility] [place], [place] W, NP   3 years ago Chronic bilateral low back pain with right-sided sciatica   [facility] Claiborne Rigg, NP

## 2021-05-02 ENCOUNTER — Encounter (HOSPITAL_COMMUNITY): Payer: Self-pay | Admitting: Psychiatry

## 2021-05-02 ENCOUNTER — Other Ambulatory Visit: Payer: Self-pay

## 2021-05-02 ENCOUNTER — Telehealth (HOSPITAL_BASED_OUTPATIENT_CLINIC_OR_DEPARTMENT_OTHER): Payer: Medicare HMO | Admitting: Psychiatry

## 2021-05-02 DIAGNOSIS — F5101 Primary insomnia: Secondary | ICD-10-CM | POA: Diagnosis not present

## 2021-05-02 DIAGNOSIS — F411 Generalized anxiety disorder: Secondary | ICD-10-CM | POA: Diagnosis not present

## 2021-05-02 DIAGNOSIS — F331 Major depressive disorder, recurrent, moderate: Secondary | ICD-10-CM | POA: Diagnosis not present

## 2021-05-02 MED ORDER — TRAZODONE HCL 50 MG PO TABS: 50.0000 mg | ORAL_TABLET | Freq: Every evening | ORAL | 0 refills | Status: DC | PRN

## 2021-05-02 MED ORDER — NORTRIPTYLINE HCL 75 MG PO CAPS: 75.0000 mg | ORAL_CAPSULE | Freq: Every day | ORAL | 0 refills | Status: DC

## 2021-05-02 MED ORDER — ARIPIPRAZOLE 2 MG PO TABS: 2.0000 mg | ORAL_TABLET | Freq: Every day | ORAL | 0 refills | Status: DC

## 2021-05-02 MED ORDER — PAROXETINE HCL 40 MG PO TABS: 40.0000 mg | ORAL_TABLET | Freq: Every day | ORAL | 0 refills | Status: DC

## 2021-05-02 NOTE — Progress Notes (Signed)
Virtual Visit via Telephone Note  I connected with Joshua [name] on 05/02/21 at  4:00 PM EST by telephone and verified that I am speaking with the correct person using two identifiers.  Location: Patient: Home Provider: Home Office   I discussed the limitations, risks, security and privacy concerns of performing an evaluation and management service by telephone and the availability of in person appointments. I also discussed with the patient that there may be a patient responsible charge related to this service. The patient expressed understanding and agreed to proceed.   History of Present Illness: It is evaluated by phone session.  His daughter translated for him.  Patient mentioned he had a good holidays.  He is taking all his medication and he started to walk every day.  Denies any panic attack or any crying spells.  He has mild tremors but does not interfere in his daily activities.  His appetite is okay.  Denies any crying spells or any feeling of hopelessness or worthlessness.  He had a good support from his family.  He sleeps good with the help of medication.  He liked the combination of the medication which is working well for him.  He is not sure when his appointment with his PCP but he has a plan to call his PCP office to find out.  He denies drinking or using any illegal substances.  His weight is stable.   Past Psychiatric History:  H/O inpatient in [place] in 2008 when walked into traffic.  Tried Risperdal, Cymbalta and temazepam with poor outcome.  Seeing psychiatrist in our office since October 2018   Psychiatric Specialty Exam: Physical Exam  Review of Systems  Weight 183 lb (83 kg).Body mass index is 27.83 kg/m.  General Appearance: NA  Eye Contact:  NA  Speech:  Slow  Volume:  Normal  Mood:  Euthymic  Affect:  NA  Thought Process:  Goal Directed  Orientation:  Full (Time, Place, and Person)  Thought Content:  WDL  Suicidal Thoughts:  No  Homicidal Thoughts:   No  Memory:  Immediate;   Fair Recent;   Fair Remote;   Fair  Judgement:  Intact  Insight:  Fair  Psychomotor Activity:  NA  Concentration:  Concentration: Fair and Attention Span: Fair  Recall:  [employer] of Knowledge:  Fair  Language:   speaks spanish  Akathisia:  No  Handed:  Right  AIMS (if indicated):     Assets:  Communication Skills Desire for Improvement Housing Social Support  ADL's:  Intact  Cognition:  WNL  Sleep:   fair      Assessment and Plan: Plan insomnia.  Major depressive disorder, recurrent.  Anxiety for  Patient doing better since taking the trazodone every night.  He has no tremors or shakes.  Continue nortriptyline 75 mg at bedtime, Abilify 2 mg daily, Paxil 40 mg daily and trazodone 50 mg at bedtime.  Recommended to call us back if is any question or any concern.  Follow-up in 3 months.    Follow Up Instructions:    I discussed the assessment and treatment plan with the patient. The patient was provided an opportunity to ask questions and all were answered. The patient agreed with the plan and demonstrated an understanding of the instructions.   The patient was advised to call back or seek an in-person evaluation if the symptoms worsen or if the condition fails to improve as anticipated.  I provided 16 minutes of non-face-to-face time  during this encounter.   Cleotis Nipper, MD

## 2021-06-14 ENCOUNTER — Other Ambulatory Visit: Payer: Self-pay | Admitting: Nurse Practitioner

## 2021-06-14 DIAGNOSIS — M5441 Lumbago with sciatica, right side: Secondary | ICD-10-CM

## 2021-06-14 DIAGNOSIS — G8929 Other chronic pain: Secondary | ICD-10-CM

## 2021-06-14 NOTE — Telephone Encounter (Signed)
Requested medication (s) are due for refill today - no longer current ? ?Requested medication (s) are on the active medication list -no ? ?Future visit scheduled -no ? ?Last refill: 5 months ? ?Notes to clinic: Request RF: non delegated Rx- fu with Dr Letta Pate recommended ? ?Requested Prescriptions  ?Pending Prescriptions Disp Refills  ? acetaminophen-codeine (TYLENOL #3) 300-30 MG tablet 60 tablet 0  ?  Sig: Take 1-2 tablets by mouth every 8 (eight) hours as needed for moderate pain. NO ADDITIONAL REFILLS. NEEDS TO FOLLOW UP WITH PHYSICAL MEDICINE  ?  ? Not Delegated - Analgesics:  Opioid Agonist Combinations 2 Failed - 06/14/2021  2:49 PM  ?  ?  Failed - This refill cannot be delegated  ?  ?  Failed - Urine Drug Screen completed in last 360 days  ?  ?  Failed - Valid encounter within last 3 months  ?  Recent Outpatient Visits   ? ?      ? 5 months ago Chronic bilateral low back pain with right-sided sciatica  ? [place], Vernia Buff, NP  ? 1 year ago Mixed hyperlipidemia  ? [place], [place] W, NP  ? 1 year ago Chronic bilateral low back pain with right-sided sciatica  ? [place], [place] W, NP  ? 2 years ago Chronic bilateral low back pain with right-sided sciatica  ? [place], [place] W, NP  ? 3 years ago Chronic bilateral low back pain with right-sided sciatica  ? [place], [place] W, NP  ? ?  ?  ? ?  ?  ?  Passed - Cr in normal range and within 360 days  ?  Creatinine, Ser  ?Date Value Ref Range Status  ?12/31/2020 0.97 0.76 - 1.27 mg/dL Final  ?  ?  ?  ?  Passed - eGFR is 10 or above and within 360 days  ?  GFR calc Af Amer  ?Date Value Ref Range Status  ?04/20/2020 119 >59 mL/min/1.73 Final  ?  Comment:  ?  **In accordance with recommendations from the NKF-ASN Task force,** ?  [facility] is in the process of updating its  eGFR calculation to the ?  2021 CKD-EPI creatinine equation that estimates kidney function ?  without a race variable. ?  ? ?GFR calc non Af Amer  ?Date Value Ref Range Status  ?04/20/2020 103 >59 mL/min/1.73 Final  ? ?eGFR  ?Date Value Ref Range Status  ?12/31/2020 96 >59 mL/min/1.73 Final  ?  ?  ?  ?  Passed - Patient is not pregnant  ?  ?  ? ? ? ?Requested Prescriptions  ?Pending Prescriptions Disp Refills  ? acetaminophen-codeine (TYLENOL #3) 300-30 MG tablet 60 tablet 0  ?  Sig: Take 1-2 tablets by mouth every 8 (eight) hours as needed for moderate pain. NO ADDITIONAL REFILLS. NEEDS TO FOLLOW UP WITH PHYSICAL MEDICINE  ?  ? Not Delegated - Analgesics:  Opioid Agonist Combinations 2 Failed - 06/14/2021  2:49 PM  ?  ?  Failed - This refill cannot be delegated  ?  ?  Failed - Urine Drug Screen completed in last 360 days  ?  ?  Failed - Valid encounter within last 3 months  ?  Recent Outpatient Visits   ? ?      ? 5 months ago Chronic bilateral low back  pain with right-sided sciatica  ? [place], Vernia Buff, NP  ? 1 year ago Mixed hyperlipidemia  ? [place], [place] W, NP  ? 1 year ago Chronic bilateral low back pain with right-sided sciatica  ? [place], [place] W, NP  ? 2 years ago Chronic bilateral low back pain with right-sided sciatica  ? [place], [place] W, NP  ? 3 years ago Chronic bilateral low back pain with right-sided sciatica  ? [place], [place] W, NP  ? ?  ?  ? ?  ?  ?  Passed - Cr in normal range and within 360 days  ?  Creatinine, Ser  ?Date Value Ref Range Status  ?12/31/2020 0.97 0.76 - 1.27 mg/dL Final  ?  ?  ?  ?  Passed - eGFR is 10 or above and within 360 days  ?  GFR calc Af Amer  ?Date Value Ref Range Status  ?04/20/2020 119 >59 mL/min/1.73 Final  ?  Comment:  ?  **In accordance with recommendations  from the NKF-ASN Task force,** ?  [facility] is in the process of updating its eGFR calculation to the ?  2021 CKD-EPI creatinine equation that estimates kidney function ?  without a race variable. ?  ? ?GFR calc non Af Amer  ?Date Value Ref Range Status  ?04/20/2020 103 >59 mL/min/1.73 Final  ? ?eGFR  ?Date Value Ref Range Status  ?12/31/2020 96 >59 mL/min/1.73 Final  ?  ?  ?  ?  Passed - Patient is not pregnant  ?  ?  ? ? ? ?

## 2021-06-14 NOTE — Telephone Encounter (Signed)
Copied from CRM [id number]. Topic: Quick Communication - Rx Refill/Question ?>> Jun 14, 2021  2:11 PM Jaquita Rector A wrote: ?Medication: acetaminophen-codeine (TYLENOL #3) 300-30 MG tablet ? ?Has the patient contacted their pharmacy? No. ?(Agent: If no, request that the patient contact the pharmacy for the refill. If patient does not wish to contact the pharmacy document the reason why and proceed with request.) ?(Agent: If yes, when and what did the pharmacy advise?) ? ?Preferred Pharmacy (with phone number or street name): [facility] [address], [place] - [address]  ?Phone:  [phone] ?Fax:  [phone] ? ? ? ?Has the patient been seen for an appointment in the last year OR does the patient have an upcoming appointment? Yes.   ? ?Agent: Please be advised that RX refills may take up to 3 business days. We ask that you follow-up with your pharmacy. ?

## 2021-07-04 ENCOUNTER — Ambulatory Visit: Payer: Self-pay | Admitting: *Deleted

## 2021-07-04 NOTE — Telephone Encounter (Signed)
?  Summary: Back pain  ? Back pain for several months, patient scheduled appointment for 07/24/2021 but would like clinical advice prior to appointment. Spanish interpreter is needed   ?  ? ? ? ?Chief Complaint: back pain requesting medication ?Symptoms: middle to low back pain shoots down both legs. Right leg weaker than left. Can walk. Difficulty sleeping and in constant pain not taking any medications  ?Frequency: approx. 1 month  ?Pertinent Negatives: Patient denies inability to walk, no issues with B/B ?Disposition: [] ED /[x] Urgent Care (no appt availability in office) / [] Appointment(In office/virtual)/ []  [facility]/ [] Home Care/ [] Refused Recommended Disposition /[] [place] [facility]/ [x]  Follow-up with PCP ?Additional Notes:  ? ?Recommended UC/ ED for pain medication. Earliest appt scheduled for 07/24/21. Please advise if pain medication can be given prior to OV.   ? ? ?Reason for Disposition ? [1] SEVERE back pain (e.g., excruciating, unable to do any normal activities) AND [2] not improved 2 hours after pain medicine ? ?Answer Assessment - Initial Assessment Questions ?1. ONSET: "When did the pain begin?"  ?    Approx 3-4 weeks   ?2. LOCATION: "Where does it hurt?" (upper, mid or lower back) ?    Middle to low back  ?3. SEVERITY: "How bad is the pain?"  (e.g., Scale 1-10; mild, moderate, or severe) ?  - MILD (1-3): doesn't interfere with normal activities  ?  - MODERATE (4-7): interferes with normal activities or awakens from sleep  ?  - SEVERE (8-10): excruciating pain, unable to do any normal activities  ?    Moderate to severe ?4. PATTERN: "Is the pain constant?" (e.g., yes, no; constant, intermittent)  ?    Constant  ?5. RADIATION: "Does the pain shoot into your legs or elsewhere?" ?    Shoots into legs and worse in both legs  ?6. CAUSE:  "What do you think is causing the back pain?"  ?   Injured several years ago and recently got worse ?7. BACK OVERUSE:  "Any recent lifting of heavy  objects, strenuous work or exercise?" ?    No  ?8. MEDICATIONS: "What have you taken so far for the pain?" (e.g., nothing, acetaminophen, NSAIDS) ?    None  ?9. NEUROLOGIC SYMPTOMS: "Do you have any weakness, numbness, or problems with bowel/bladder control?" ?    Weakness in right leg, numbness some times ?10. OTHER SYMPTOMS: "Do you have any other symptoms?" (e.g., fever, abdominal pain, burning with urination, blood in urine) ?      Some abdominal pain  ?11. PREGNANCY: "Is there any chance you are pregnant?" (e.g., yes, no; LMP) ?      na ? ?Protocols used: Back Pain-A-AH ? ?

## 2021-07-05 NOTE — Telephone Encounter (Signed)
Needs to Call Dr. Wynn Banker office. They were seeing him for his back pain in 2021 ? ?[phone] ? ? ?

## 2021-07-07 ENCOUNTER — Other Ambulatory Visit: Payer: Self-pay | Admitting: Nurse Practitioner

## 2021-07-07 DIAGNOSIS — R0981 Nasal congestion: Secondary | ICD-10-CM

## 2021-07-08 NOTE — Telephone Encounter (Signed)
Called pt made aware of NP office. Verbalized understanding ?

## 2021-07-09 NOTE — Telephone Encounter (Signed)
Requested medication (s) are due for refill today: yes ? ?Requested medication (s) are on the active medication list: yes ? ?Last refill:  11/19/20 48g with 1 RF ? ?Future visit scheduled: 07/24/21 ? ?Notes to clinic:  This medication can not be delegated, please assess.  ? ? ? ?  ? ?Requested Prescriptions  ?Pending Prescriptions Disp Refills  ? fluticasone (FLONASE) 50 MCG/ACT nasal spray [Pharmacy Med Name: FLUTICASONE PROPIONATE 50 MCG/ACT Suspension] 48 g 1  ?  Sig: USE 1 TO 2 SPRAYS IN EACH NOSTRIL EVERY DAY  ?  ? Not Delegated - Ear, Nose, and Throat: Nasal Preparations - Corticosteroids Failed - 07/07/2021  3:35 AM  ?  ?  Failed - This refill cannot be delegated  ?  ?  Passed - Valid encounter within last 12 months  ?  Recent Outpatient Visits   ? ?      ? 6 months ago Chronic bilateral low back pain with right-sided sciatica  ? [facility] [place], Shea Stakes, NP  ? 1 year ago Mixed hyperlipidemia  ? [facility] [place], [place] W, NP  ? 1 year ago Chronic bilateral low back pain with right-sided sciatica  ? [facility] [place], [place] W, NP  ? 2 years ago Chronic bilateral low back pain with right-sided sciatica  ? [facility] [place], [place] W, NP  ? 3 years ago Chronic bilateral low back pain with right-sided sciatica  ? [facility] Claiborne Rigg, NP  ? ?  ?  ?Future Appointments   ? ?        ? In 2 weeks Sharon Seller, [place] [facility]  ? ?  ? ?  ?  ?  ? ? ?

## 2021-07-11 ENCOUNTER — Ambulatory Visit: Payer: Medicare HMO | Attending: Nurse Practitioner | Admitting: Physician Assistant

## 2021-07-11 ENCOUNTER — Encounter: Payer: Self-pay | Admitting: Physician Assistant

## 2021-07-11 VITALS — BP 120/78 | HR 63 | Wt 190.0 lb

## 2021-07-11 DIAGNOSIS — Z23 Encounter for immunization: Secondary | ICD-10-CM | POA: Diagnosis not present

## 2021-07-11 DIAGNOSIS — Z789 Other specified health status: Secondary | ICD-10-CM

## 2021-07-11 DIAGNOSIS — M5441 Lumbago with sciatica, right side: Secondary | ICD-10-CM

## 2021-07-11 DIAGNOSIS — G8929 Other chronic pain: Secondary | ICD-10-CM

## 2021-07-11 MED ORDER — TRAMADOL HCL 50 MG PO TABS: 50.0000 mg | ORAL_TABLET | Freq: Three times a day (TID) | ORAL | 0 refills | Status: AC | PRN

## 2021-07-11 MED ORDER — METHOCARBAMOL 500 MG PO TABS: 500.0000 mg | ORAL_TABLET | Freq: Four times a day (QID) | ORAL | 0 refills | Status: DC | PRN

## 2021-07-11 MED ORDER — NAPROXEN 500 MG PO TABS: 500.0000 mg | ORAL_TABLET | Freq: Two times a day (BID) | ORAL | 0 refills | Status: DC

## 2021-07-11 NOTE — Progress Notes (Signed)
Patient ID: Joshua [name], male   DOB: 26-Jun-1970, 51 y.o.   MRN: [id number] ? ? ? ?Joshua [name], is a 51 y.o. male ? ?[id number] ? ?[id number] ? ?DOB - 03-Apr-1971 ? ?Chief Complaint  ?Patient presents with  ? Back Pain  ? Joint Pain  ?    ? ?Subjective:  ? ?Joshua [name] is a 51 y.o. male here today for for continued low back pain.  He has had this since an injury in [place] ?No problems updated. ? ?ALLERGIES: ?Allergies  ?Allergen Reactions  ? Flexeril [Cyclobenzaprine]   ?  Pt turns very aggressive  ? ? ?PAST MEDICAL HISTORY: ?Past Medical History:  ?Diagnosis Date  ? Anxiety   ? Back pain with right-sided radiculopathy   ? Depression   ? High cholesterol   ? ? ?MEDICATIONS AT HOME: ?Prior to Admission medications   ?Medication Sig Start Date End Date Taking? Authorizing Provider  ?ARIPiprazole (ABILIFY) 2 MG tablet Take 1 tablet (2 mg total) by mouth daily. 05/02/21  Yes Arfeen, Phillips Grout, MD  ?ARIPiprazole (ABILIFY) 2 MG tablet Take by mouth. 02/16/18  Yes [provider]  ?atorvastatin (LIPITOR) 40 MG tablet TAKE 1 TABLET BY MOUTH DAILY. 04/29/21  Yes Claiborne Rigg, NP  ?fluticasone (FLONASE) 50 MCG/ACT nasal spray USE 1 TO 2 SPRAYS IN EACH NOSTRIL EVERY DAY 07/09/21  Yes Hoy Register, MD  ?methocarbamol (ROBAXIN) 500 MG tablet Take 1 tablet (500 mg total) by mouth every 6 (six) hours as needed for muscle spasms. 07/11/21  Yes Anders Simmonds, PA-C  ?naproxen (NAPROSYN) 500 MG tablet Take 1 tablet (500 mg total) by mouth 2 (two) times daily with a meal. Prn pain 07/11/21  Yes Georgian Co M, PA-C  ?nortriptyline (PAMELOR) 75 MG capsule Take 1 capsule (75 mg total) by mouth at bedtime. 05/02/21 05/02/22 Yes Arfeen, Phillips Grout, MD  ?omeprazole (PRILOSEC) 40 MG capsule TAKE 1 CAPSULE BY MOUTH DAILY. 04/29/21  Yes Claiborne Rigg, NP  ?PARoxetine (PAXIL) 40 MG tablet Take 1 tablet (40 mg total) by mouth daily. 05/02/21  Yes Arfeen, Phillips Grout, MD  ?PARoxetine (PAXIL) 40 MG tablet Take by mouth.  02/16/18  Yes [provider]  ?traMADol (ULTRAM) 50 MG tablet Take 1 tablet (50 mg total) by mouth every 8 (eight) hours as needed for up to 5 days for severe pain. Use sparingly and only if the other 2 meds are not giving enough relief 07/11/21 07/16/21 Yes Myndi Wamble, Marzella Schlein, PA-C  ?traZODone (DESYREL) 50 MG tablet Take 1 tablet (50 mg total) by mouth at bedtime as needed. for sleep 05/02/21  Yes Arfeen, Phillips Grout, MD  ?triamcinolone (KENALOG) 0.025 % ointment APPLY 1 APPLICATION TOPICALLY 2 (TWO) TIMES DAILY. 04/25/21  Yes Claiborne Rigg, NP  ?aspirin 81 MG tablet Take 1 tablet (81 mg total) by mouth daily. ?Patient not taking: Reported on 04/20/2020 09/12/16   Lizbeth Bark, FNP  ?icosapent Ethyl (VASCEPA) 1 g capsule Take 2 capsules (2 g total) by mouth 2 (two) times daily. 04/27/20 05/27/20  Claiborne Rigg, NP  ? ? ?ROS: ?Neg HEENT ?Neg resp ?Neg cardiac ?Neg GI ?Neg GU ?Neg MS ?Neg psych ?Neg neuro ? ?Objective:  ? ?Vitals:  ? 07/11/21 0922  ?BP: 120/78  ?Pulse: 63  ?SpO2: 95%  ?Weight: 190 lb (86.2 kg)  ? ?Exam ?General appearance : Awake, alert, not in any distress. Speech Clear. Not toxic looking ?HEENT: Atraumatic and Normocephalic, pupils equally reactive to light and accomodation ?Neck:  Supple, no JVD. No cervical lymphadenopathy.  ?Chest: Good air entry bilaterally, CTAB.  No rales/rhonchi/wheezing ?CVS: S1 S2 regular, no murmurs.  ?Unable to perform adequate exam due to discomfort ?Extremities: B/L Lower Ext shows no edema, both legs are warm to touch ?Neurology: Awake alert, and oriented X 3, CN II-XII intact, Non focal ?Skin: No Rash ? ?Data Review ?No results found for: HGBA1C ? ?Assessment & Plan  ? ?1. Chronic bilateral low back pain with right-sided sciatica ?- Ambulatory referral to Physical Medicine Rehab ?- methocarbamol (ROBAXIN) 500 MG tablet; Take 1 tablet (500 mg total) by mouth every 6 (six) hours as needed for muscle spasms.  Dispense: 90 tablet; Refill: 0 ?- naproxen (NAPROSYN)  500 MG tablet; Take 1 tablet (500 mg total) by mouth 2 (two) times daily with a meal. Prn pain  Dispense: 60 tablet; Refill: 0 ?- traMADol (ULTRAM) 50 MG tablet; Take 1 tablet (50 mg total) by mouth every 8 (eight) hours as needed for up to 5 days for severe pain. Use sparingly and only if the other 2 meds are not giving enough relief  Dispense: 15 tablet; Refill: 0 ? ?2. Language barrier ?[employer] interpreters used and additional time performing visit was required. ? ? ? ? ?Patient have been counseled extensively about nutrition and exercise. Other issues discussed during this visit include: low cholesterol diet, weight control and daily exercise, foot care, annual eye examinations at Ophthalmology, importance of adherence with medications and regular follow-up. We also discussed long term complications of uncontrolled diabetes and hypertension.  ? ?Return in about 2 months (around 09/10/2021) for to see PCP for chronic conditions. ? ?The patient was given clear instructions to go to ER or return to medical center if symptoms don't improve, worsen or new problems develop. The patient verbalized understanding. The patient was told to call to get lab results if they haven't heard anything in the next week.  ? ? ? ? ?Georgian Co, PA-C ?[facility] ?[place], [place] ?[phone]   ?07/11/2021, 9:54 AM  ?

## 2021-07-24 ENCOUNTER — Ambulatory Visit: Payer: Medicare HMO | Admitting: Physician Assistant

## 2021-07-30 ENCOUNTER — Telehealth (HOSPITAL_BASED_OUTPATIENT_CLINIC_OR_DEPARTMENT_OTHER): Payer: Medicare HMO | Admitting: Psychiatry

## 2021-07-30 ENCOUNTER — Encounter (HOSPITAL_COMMUNITY): Payer: Self-pay | Admitting: Psychiatry

## 2021-07-30 DIAGNOSIS — F5101 Primary insomnia: Secondary | ICD-10-CM

## 2021-07-30 DIAGNOSIS — F411 Generalized anxiety disorder: Secondary | ICD-10-CM | POA: Diagnosis not present

## 2021-07-30 DIAGNOSIS — F331 Major depressive disorder, recurrent, moderate: Secondary | ICD-10-CM | POA: Diagnosis not present

## 2021-07-30 MED ORDER — ARIPIPRAZOLE 2 MG PO TABS: 2.0000 mg | ORAL_TABLET | Freq: Every day | ORAL | 0 refills | Status: DC

## 2021-07-30 MED ORDER — PAROXETINE HCL 40 MG PO TABS: 40.0000 mg | ORAL_TABLET | Freq: Every day | ORAL | 0 refills | Status: DC

## 2021-07-30 MED ORDER — TRAZODONE HCL 50 MG PO TABS: 50.0000 mg | ORAL_TABLET | Freq: Every evening | ORAL | 0 refills | Status: DC | PRN

## 2021-07-30 MED ORDER — NORTRIPTYLINE HCL 75 MG PO CAPS
75.0000 mg | ORAL_CAPSULE | Freq: Every day | ORAL | 0 refills | Status: DC
Start: 2021-07-30 — End: 2022-01-28

## 2021-07-30 NOTE — Progress Notes (Signed)
Virtual Visit via Telephone Note ? ?I connected with Joshua [name] on 07/30/21 at  4:20 PM EDT by telephone and verified that I am speaking with the correct person using two identifiers. ? ?Location: ?Patient: Home ?Provider: Home Office ?  ?I discussed the limitations, risks, security and privacy concerns of performing an evaluation and management service by telephone and the availability of in person appointments. I also discussed with the patient that there may be a patient responsible charge related to this service. The patient expressed understanding and agreed to proceed. ? ? ?History of Present Illness: ?Patient is evaluated by phone session.  His 50 year old son Joshua [name] translated for him.  He is doing better on his medication.  He sleeps good day and he walks every day.  His energy level is good.  He denies any crying spells or any feeling of hopelessness or worthlessness.  He denies any panic attack.  He does drive and able to go to the grocery stores without having any fear or getting overwhelmed.  He has mild tremors but does not interfere in his daily routine.  He is taking multiple medication but reluctant to cut down because he feels good on these medication.  Denies any hallucination, paranoia, suicidal thoughts.  His appetite is okay.  His weight is stable. ? ?Past Psychiatric History:  ?H/O inpatient in [place] in 2008 when walked into traffic.  Tried Risperdal, Cymbalta and temazepam with poor outcome.  Seeing psychiatrist in our office since October 2018. ? ?Psychiatric Specialty Exam: ?Physical Exam  ?Review of Systems  ?Weight 190 lb (86.2 kg).There is no height or weight on file to calculate BMI.  ?General Appearance: NA  ?Eye Contact:  NA  ?Speech:  Slow  ?Volume:  Decreased  ?Mood:  Anxious  ?Affect:  NA  ?Thought Process:  Descriptions of Associations: Intact  ?Orientation:  Full (Time, Place, and Person)  ?Thought Content:  Logical  ?Suicidal Thoughts:  No  ?Homicidal Thoughts:  No   ?Memory:  Immediate;   Fair ?Recent;   Fair ?Remote;   Fair  ?Judgement:  Fair  ?Insight:  Present  ?Psychomotor Activity:  NA  ?Concentration:  Concentration: Fair and Attention Span: Fair  ?Recall:  Fair  ?Fund of Knowledge:  Fair  ?Language:   speaks spanish  ?Akathisia:  No  ?Handed:  Right  ?AIMS (if indicated):     ?Assets:  Desire for Improvement ?Housing ?Social Support ?Transportation  ?ADL's:  Intact  ?Cognition:  WNL  ?Sleep:   fair  ? ? ? ? ?Assessment and Plan: ?Major depressive disorder, recurrent.  Anxiety.  Primary insomnia. ? ?Patient is stable on his current medication.  Talk about polypharmacy but patient reluctant to cut down his medication since he is doing well.  We will continue nortriptyline 75 mg at bedtime, Abilify 2 mg daily, Paxil 40 mg daily and trazodone 50 mg at bedtime.  Recommended to call us back if is any question or any concern.  Follow-up in 3 months. ? ?Follow Up Instructions: ? ?  ?I discussed the assessment and treatment plan with the patient. The patient was provided an opportunity to ask questions and all were answered. The patient agreed with the plan and demonstrated an understanding of the instructions. ?  ?The patient was advised to call back or seek an in-person evaluation if the symptoms worsen or if the condition fails to improve as anticipated. ? ?Collaboration of Care: Primary Care Provider AEB notes are available in epic to review. ? ?  Patient/Guardian was advised Release of Information must be obtained prior to any record release in order to collaborate their care with an outside provider. Patient/Guardian was advised if they have not already done so to contact the registration department to sign all necessary forms in order for [place] to release information regarding their care.  ? ?Consent: Patient/Guardian gives verbal consent for treatment and assignment of benefits for services provided during this visit. Patient/Guardian expressed understanding and agreed to  proceed.   ? ?I provided 13 minutes of non-face-to-face time during this encounter. ? ? ?Cleotis Nipper, MD  ?

## 2021-10-07 ENCOUNTER — Other Ambulatory Visit: Payer: Self-pay | Admitting: Nurse Practitioner

## 2021-10-07 DIAGNOSIS — E782 Mixed hyperlipidemia: Secondary | ICD-10-CM

## 2021-10-07 DIAGNOSIS — K219 Gastro-esophageal reflux disease without esophagitis: Secondary | ICD-10-CM

## 2021-10-07 DIAGNOSIS — G8929 Other chronic pain: Secondary | ICD-10-CM

## 2021-10-16 ENCOUNTER — Encounter: Payer: Self-pay | Admitting: Nurse Practitioner

## 2021-10-16 ENCOUNTER — Ambulatory Visit: Payer: Medicare HMO | Attending: Nurse Practitioner | Admitting: Nurse Practitioner

## 2021-10-16 VITALS — BP 151/99 | HR 73 | Temp 99.0°F | Wt 181.1 lb

## 2021-10-16 DIAGNOSIS — G8929 Other chronic pain: Secondary | ICD-10-CM

## 2021-10-16 DIAGNOSIS — R748 Abnormal levels of other serum enzymes: Secondary | ICD-10-CM

## 2021-10-16 DIAGNOSIS — E782 Mixed hyperlipidemia: Secondary | ICD-10-CM

## 2021-10-16 DIAGNOSIS — M5441 Lumbago with sciatica, right side: Secondary | ICD-10-CM | POA: Diagnosis not present

## 2021-10-16 DIAGNOSIS — I10 Essential (primary) hypertension: Secondary | ICD-10-CM | POA: Diagnosis not present

## 2021-10-16 DIAGNOSIS — D72829 Elevated white blood cell count, unspecified: Secondary | ICD-10-CM | POA: Diagnosis not present

## 2021-10-16 DIAGNOSIS — Z1211 Encounter for screening for malignant neoplasm of colon: Secondary | ICD-10-CM | POA: Diagnosis not present

## 2021-10-16 DIAGNOSIS — D649 Anemia, unspecified: Secondary | ICD-10-CM

## 2021-10-16 MED ORDER — METHOCARBAMOL 500 MG PO TABS: 500.0000 mg | ORAL_TABLET | Freq: Four times a day (QID) | ORAL | 3 refills | Status: DC | PRN

## 2021-10-16 MED ORDER — ACETAMINOPHEN-CODEINE 300-30 MG PO TABS: 1.0000 | ORAL_TABLET | Freq: Two times a day (BID) | ORAL | 0 refills | Status: DC | PRN

## 2021-10-16 NOTE — Progress Notes (Signed)
Assessment & Plan:  Joshua [name] was seen today for back pain.  Diagnoses and all orders for this visit:  Chronic bilateral low back pain with right-sided sciatica -     Drug Screen 13 w/Conf, WB -     methocarbamol (ROBAXIN) 500 MG tablet; Take 1 tablet (500 mg total) by mouth every 6 (six) hours as needed for muscle spasms. -     acetaminophen-codeine (TYLENOL #3) 300-30 MG tablet; Take 1-2 tablets by mouth every 12 (twelve) hours as needed for moderate pain.  Mixed hyperlipidemia -     Lipid panel  Colon cancer screening -     Cologuard  Elevated liver enzymes -     CMP14+EGFR  Anemia, unspecified type  Leukocytosis, unspecified type -     CBC with Differential    Patient has been counseled on age-appropriate routine health concerns for screening and prevention. These are reviewed and up-to-date. Referrals have been placed accordingly. Immunizations are up-to-date or declined.    Subjective:   Chief Complaint  Patient presents with   Back Pain   HPI Joshua [name] 51 y.o. male presents to office today for follow up to low back pain  He has a past medical history of Anxiety, Back pain with right-sided radiculopathy, Depression, and High cholesterol.   Patient has been counseled on age-appropriate routine health concerns for screening and prevention. These are reviewed and up-to-date. Referrals have been placed accordingly. Immunizations are up-to-date or declined.     Colonoscopy: Referred to GI however states he cannot afford the co-pay of $300.  We will order Cologuard at this time  Chronic Back Pain He has back pain with right-sided radiculopathy.  Was being followed by Dr. Letta Pate with PMR several years ago and was receiving steroid spinal injections.  He has a surgical history of L5-S1 transforaminal lumbar epidural steroid injection on 10/16/2017.  He has tried physical therapy in the past without significant improvement and currently uses a cane to assist with  ambulation.  He was referred back to physical medicine rehab in March however after numerous unsuccessful attempts to reach him the referral was closed.  Today he states that the number that we have on file belongs to his wife and she may or may not check voicemails. 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 Denies any involuntary loss of urine or stool.  I have courtesy filled his Tylenol 3 and drug screen was ordered today.  Blood pressure is elevated today.  He is not on any blood pressure medications at this time.  We will have him return in a few weeks for recheck of blood pressure. BP Readings from Last 3 Encounters:  10/16/21 (!) 151/99  07/11/21 120/78  04/20/20 125/83     Review of Systems  Constitutional:  Negative for fever, malaise/fatigue and weight loss.  HENT: Negative.  Negative for nosebleeds.   Eyes: Negative.  Negative for blurred vision, double vision and photophobia.  Respiratory: Negative.  Negative for cough and shortness of breath.   Cardiovascular: Negative.  Negative for chest pain, palpitations and leg swelling.  Gastrointestinal: Negative.  Negative for heartburn, nausea and vomiting.  Musculoskeletal:  Positive for back pain (Uses a cane to assist with mobility). Negative for myalgias.  Neurological: Negative.  Negative for dizziness, focal weakness, seizures and headaches.  Psychiatric/Behavioral: Negative.  Negative for suicidal ideas.     Past Medical History:  Diagnosis Date   Anxiety  Back pain with right-sided radiculopathy    Depression    High cholesterol     No past surgical history on file.  Family History  Problem Relation Age of Onset   Hypertension Paternal Uncle    Diabetes Paternal Uncle     Social History Reviewed with no changes to be made today.   Outpatient Medications Prior to Visit  Medication Sig Dispense Refill   ARIPiprazole (ABILIFY) 2 MG  tablet Take by mouth.     ARIPiprazole (ABILIFY) 2 MG tablet Take 1 tablet (2 mg total) by mouth daily. 90 tablet 0   atorvastatin (LIPITOR) 40 MG tablet TAKE 1 TABLET EVERY DAY 90 tablet 0   fluticasone (FLONASE) 50 MCG/ACT nasal spray USE 1 TO 2 SPRAYS IN EACH NOSTRIL EVERY DAY 16 g 0   naproxen (NAPROSYN) 500 MG tablet Take 1 tablet (500 mg total) by mouth 2 (two) times daily with a meal. Prn pain 60 tablet 0   nortriptyline (PAMELOR) 75 MG capsule Take 1 capsule (75 mg total) by mouth at bedtime. 90 capsule 0   omeprazole (PRILOSEC) 40 MG capsule TAKE 1 CAPSULE EVERY DAY 90 capsule 0   PARoxetine (PAXIL) 40 MG tablet Take 1 tablet (40 mg total) by mouth daily. 90 tablet 0   traZODone (DESYREL) 50 MG tablet Take 1 tablet (50 mg total) by mouth at bedtime as needed. for sleep 90 tablet 0   triamcinolone (KENALOG) 0.025 % ointment APPLY 1 APPLICATION TOPICALLY 2 (TWO) TIMES DAILY. 320 g 1   methocarbamol (ROBAXIN) 500 MG tablet Take 1 tablet (500 mg total) by mouth every 6 (six) hours as needed for muscle spasms. 90 tablet 0   aspirin 81 MG tablet Take 1 tablet (81 mg total) by mouth daily. (Patient not taking: Reported on 04/20/2020) 90 tablet 3   icosapent Ethyl (VASCEPA) 1 g capsule Take 2 capsules (2 g total) by mouth 2 (two) times daily. 120 capsule 1   No facility-administered medications prior to visit.    Allergies  Allergen Reactions   Flexeril [Cyclobenzaprine]     Pt turns very aggressive       Objective:    BP (!) 151/99 (BP Location: Right Arm, Patient Position: Sitting, Cuff Size: Normal)   Pulse 73   Temp 99 F (37.2 C) (Oral)   Wt 181 lb 1.6 oz (82.1 kg)   SpO2 97%   BMI 27.54 kg/m  Wt Readings from Last 3 Encounters:  10/16/21 181 lb 1.6 oz (82.1 kg)  07/11/21 190 lb (86.2 kg)  04/20/20 191 lb 9.6 oz (86.9 kg)    Physical Exam Vitals and nursing note reviewed.  Constitutional:      Appearance: He is well-developed.  HENT:     Head: Normocephalic and  atraumatic.  Cardiovascular:     Rate and Rhythm: Normal rate and regular rhythm.     Heart sounds: Normal heart sounds. No murmur heard.    No friction rub. No gallop.  Pulmonary:     Effort: Pulmonary effort is normal. No tachypnea or respiratory distress.     Breath sounds: Normal breath sounds. No decreased breath sounds, wheezing, rhonchi or rales.  Chest:     Chest wall: No tenderness.  Abdominal:     General: Bowel sounds are normal.     Palpations: Abdomen is soft.  Musculoskeletal:        General: Normal range of motion.     Cervical back: Normal range of motion.  Skin:  General: Skin is warm and dry.  Neurological:     Mental Status: He is alert and oriented to person, place, and time.     Coordination: Coordination normal.  Psychiatric:        Behavior: Behavior normal. Behavior is cooperative.        Thought Content: Thought content normal.        Judgment: Judgment normal.          Patient has been counseled extensively about nutrition and exercise as well as the importance of adherence with medications and regular follow-up. The patient was given clear instructions to go to ER or return to medical center if symptoms don't improve, worsen or new problems develop. The patient verbalized understanding.   Follow-up: Return in about 3 months (around 01/16/2022).   Gildardo Pounds, FNP-BC [facility] [place], [place]   10/16/2021, 4:29 PM

## 2021-10-24 ENCOUNTER — Other Ambulatory Visit: Payer: Self-pay | Admitting: Nurse Practitioner

## 2021-10-24 DIAGNOSIS — E781 Pure hyperglyceridemia: Secondary | ICD-10-CM

## 2021-10-24 LAB — LIPID PANEL
Chol/HDL Ratio: 6.3 ratio — ABNORMAL HIGH (ref 0.0–5.0)
Cholesterol, Total: 228 mg/dL — ABNORMAL HIGH (ref 100–199)
HDL: 36 mg/dL — ABNORMAL LOW (ref >39)
LDL Chol Calc (NIH): 160 mg/dL — ABNORMAL HIGH (ref 0–99)
Triglycerides: 173 mg/dL — ABNORMAL HIGH (ref 0–149)
VLDL Cholesterol Cal: 32 mg/dL (ref 5–40)

## 2021-10-24 LAB — CMP14+EGFR
ALT: 69 IU/L — ABNORMAL HIGH (ref 0–44)
AST: 39 IU/L (ref 0–40)
Albumin/Globulin Ratio: 1.8 (ref 1.2–2.2)
Albumin: 4.7 g/dL (ref 4.0–5.0)
Alkaline Phosphatase: 108 IU/L (ref 44–121)
BUN/Creatinine Ratio: 18 (ref 9–20)
BUN: 17 mg/dL (ref 6–24)
Bilirubin Total: 0.5 mg/dL (ref 0.0–1.2)
CO2: 25 mmol/L (ref 20–29)
Calcium: 10 mg/dL (ref 8.7–10.2)
Chloride: 104 mmol/L (ref 96–106)
Creatinine, Ser: 0.96 mg/dL (ref 0.76–1.27)
Globulin, Total: 2.6 g/dL (ref 1.5–4.5)
Glucose: 85 mg/dL (ref 70–99)
Potassium: 4.6 mmol/L (ref 3.5–5.2)
Sodium: 141 mmol/L (ref 134–144)
Total Protein: 7.3 g/dL (ref 6.0–8.5)
eGFR: 97 mL/min/{1.73_m2} (ref >59)

## 2021-10-24 LAB — CBC WITH DIFFERENTIAL/PLATELET
Basophils Absolute: 0.1 10*3/uL (ref 0.0–0.2)
Basos: 1 %
EOS (ABSOLUTE): 0.3 10*3/uL (ref 0.0–0.4)
Eos: 3 %
Hematocrit: 47.7 % (ref 37.5–51.0)
Hemoglobin: 16.3 g/dL (ref 13.0–17.7)
Immature Grans (Abs): 0 10*3/uL (ref 0.0–0.1)
Immature Granulocytes: 0 %
Lymphocytes Absolute: 2.3 10*3/uL (ref 0.7–3.1)
Lymphs: 24 %
MCH: 29.6 pg (ref 26.6–33.0)
MCHC: 34.2 g/dL (ref 31.5–35.7)
MCV: 87 fL (ref 79–97)
Monocytes Absolute: 0.9 10*3/uL (ref 0.1–0.9)
Monocytes: 9 %
Neutrophils Absolute: 6.1 10*3/uL (ref 1.4–7.0)
Neutrophils: 63 %
Platelets: 294 10*3/uL (ref 150–450)
RBC: 5.51 x10E6/uL (ref 4.14–5.80)
RDW: 12.8 % (ref 11.6–15.4)
WBC: 9.8 10*3/uL (ref 3.4–10.8)

## 2021-10-24 LAB — DRUG SCREEN 13 W/CONF, WB
Amphetamines, IA: NEGATIVE ng/mL
Barbiturates, IA: NEGATIVE ug/mL
Benzodiazepines, IA: NEGATIVE ng/mL
Cocaine/Metabolite, IA: NEGATIVE ng/mL
Fentanyl, IA: NEGATIVE ng/mL
Meperidine, IA: NEGATIVE ng/mL
Methadone, IA: NEGATIVE ng/mL
Opiates, IA: NEGATIVE ng/mL
Oxycodones, IA: NEGATIVE ng/mL
Phencyclidine, IA: NEGATIVE ng/mL
Propoxyphene, IA: NEGATIVE ng/mL
THC (Marijuana) Mtb, IA: NEGATIVE ng/mL
Tramadol, IA: NEGATIVE ng/mL

## 2021-10-24 MED ORDER — ICOSAPENT ETHYL 1 G PO CAPS
2.0000 g | ORAL_CAPSULE | Freq: Two times a day (BID) | ORAL | 1 refills | Status: DC
Start: 2021-10-24 — End: 2024-01-05

## 2021-10-29 ENCOUNTER — Telehealth (HOSPITAL_COMMUNITY): Payer: Medicare HMO | Admitting: Psychiatry

## 2021-11-08 ENCOUNTER — Other Ambulatory Visit (HOSPITAL_COMMUNITY): Payer: Self-pay | Admitting: Psychiatry

## 2021-11-08 DIAGNOSIS — F331 Major depressive disorder, recurrent, moderate: Secondary | ICD-10-CM

## 2021-11-08 DIAGNOSIS — F411 Generalized anxiety disorder: Secondary | ICD-10-CM

## 2021-11-08 DIAGNOSIS — F5101 Primary insomnia: Secondary | ICD-10-CM

## 2021-11-21 DIAGNOSIS — M542 Cervicalgia: Secondary | ICD-10-CM | POA: Diagnosis not present

## 2021-11-21 DIAGNOSIS — H538 Other visual disturbances: Secondary | ICD-10-CM | POA: Diagnosis not present

## 2021-11-21 DIAGNOSIS — G44209 Tension-type headache, unspecified, not intractable: Secondary | ICD-10-CM | POA: Diagnosis not present

## 2021-11-21 DIAGNOSIS — M62838 Other muscle spasm: Secondary | ICD-10-CM | POA: Diagnosis not present

## 2021-11-21 DIAGNOSIS — J341 Cyst and mucocele of nose and nasal sinus: Secondary | ICD-10-CM | POA: Diagnosis not present

## 2021-11-22 DIAGNOSIS — M542 Cervicalgia: Secondary | ICD-10-CM | POA: Diagnosis not present

## 2021-11-22 DIAGNOSIS — J341 Cyst and mucocele of nose and nasal sinus: Secondary | ICD-10-CM | POA: Diagnosis not present

## 2021-11-22 DIAGNOSIS — H538 Other visual disturbances: Secondary | ICD-10-CM | POA: Diagnosis not present

## 2022-01-28 ENCOUNTER — Telehealth (HOSPITAL_BASED_OUTPATIENT_CLINIC_OR_DEPARTMENT_OTHER): Payer: Medicare HMO | Admitting: Psychiatry

## 2022-01-28 ENCOUNTER — Encounter (HOSPITAL_COMMUNITY): Payer: Self-pay | Admitting: Psychiatry

## 2022-01-28 DIAGNOSIS — F411 Generalized anxiety disorder: Secondary | ICD-10-CM | POA: Diagnosis not present

## 2022-01-28 DIAGNOSIS — F331 Major depressive disorder, recurrent, moderate: Secondary | ICD-10-CM

## 2022-01-28 DIAGNOSIS — F5101 Primary insomnia: Secondary | ICD-10-CM | POA: Diagnosis not present

## 2022-01-28 MED ORDER — PAROXETINE HCL 40 MG PO TABS
40.0000 mg | ORAL_TABLET | Freq: Every day | ORAL | 0 refills | Status: DC
Start: 2022-01-28 — End: 2022-04-29

## 2022-01-28 MED ORDER — ARIPIPRAZOLE 2 MG PO TABS: 2.0000 mg | ORAL_TABLET | Freq: Every day | ORAL | 0 refills | Status: DC

## 2022-01-28 MED ORDER — TRAZODONE HCL 50 MG PO TABS: 50.0000 mg | ORAL_TABLET | Freq: Every evening | ORAL | 0 refills | Status: DC | PRN

## 2022-01-28 MED ORDER — NORTRIPTYLINE HCL 75 MG PO CAPS
75.0000 mg | ORAL_CAPSULE | Freq: Every day | ORAL | 0 refills | Status: DC
Start: 2022-01-28 — End: 2022-04-29

## 2022-01-28 NOTE — Progress Notes (Signed)
Virtual Visit via Telephone Note  I connected with Howell Rucks on 01/28/22 at  4:00 PM EDT by telephone and verified that I am speaking with the correct person using two identifiers.  Location: Patient: Home Provider: Home Office   I discussed the limitations, risks, security and privacy concerns of performing an evaluation and management service by telephone and the availability of in person appointments. I also discussed with the patient that there may be a patient responsible charge related to this service. The patient expressed understanding and agreed to proceed.   History of Present Illness: Patient is evaluated by phone session.  His son translated for [place].  He is without the medication for past 3 days because he did not pick up from the pharmacy.  He admitted some anxiety, poor sleep, racing thoughts.  However when he takes the medicine he do very well.  He admitted sometime not take the Abilify because it was too expensive and wondering if he can try a different medication.  He is taking Pamelor, Paxil and trazodone.  I discussed try to stay on these 3 medication and he may not need the Abilify.  However if he needed Abilify we may consider adding low-dose Risperdal.  He denies any anhedonia, feeling of hopelessness or worthlessness.  His appetite do okay.  His weight is stable.     Past Psychiatric History:  H/O inpatient in [place] in 2008 when walked into traffic.  Tried Risperdal, Cymbalta and temazepam with poor outcome.  Seeing psychiatrist in our office since October 2018.  Psychiatric Specialty Exam: Physical Exam  Review of Systems  Weight 181 lb (82.1 kg).There is no height or weight on file to calculate BMI.  General Appearance: NA  Eye Contact:  NA  Speech:  Slow  Volume:  Decreased  Mood:  Depressed and Dysphoric  Affect:  NA  Thought Process:  Goal Directed  Orientation:  Full (Time, Place, and Person)  Thought Content:  Rumination  Suicidal Thoughts:  No   Homicidal Thoughts:  No  Memory:  Immediate;   Fair Recent;   Fair Remote;   Fair  Judgement:  Fair  Insight:  Shallow  Psychomotor Activity:  NA  Concentration:  Concentration: Fair and Attention Span: Fair  Recall:  [employer] of Knowledge:  Fair  Language:  Fair  Akathisia:  No  Handed:  Right  AIMS (if indicated):     Assets:  Desire for Improvement Housing Social Support  ADL's:  Intact  Cognition:  WNL  Sleep:   fair      Assessment and Plan: Major depressive disorder, recurrent.  Anxiety.  Primary insomnia.  Recommend to discontinue Abilify if it is expensive.  Patient already taking Paxil, trazodone and nortriptyline and if he noticed symptoms are coming back then he could call us back.  Patient agreed with the plan.  Follow-up in 3 months unless patient requires earlier appointment.  Follow Up Instructions:    I discussed the assessment and treatment plan with the patient. The patient was provided an opportunity to ask questions and all were answered. The patient agreed with the plan and demonstrated an understanding of the instructions.   The patient was advised to call back or seek an in-person evaluation if the symptoms worsen or if the condition fails to improve as anticipated.  Collaboration of Care: Other provider involved in patient's care AEB notes are available in epic to review.  Patient/Guardian was advised Release of Information must be obtained prior  to any record release in order to collaborate their care with an outside provider. Patient/Guardian was advised if they have not already done so to contact the registration department to sign all necessary forms in order for [place] to release information regarding their care.   Consent: Patient/Guardian gives verbal consent for treatment and assignment of benefits for services provided during this visit. Patient/Guardian expressed understanding and agreed to proceed.    I provided 18 minutes of  non-face-to-face time during this encounter.   Kathlee Nations, MD

## 2022-02-14 ENCOUNTER — Other Ambulatory Visit: Payer: Self-pay | Admitting: Nurse Practitioner

## 2022-02-14 DIAGNOSIS — K219 Gastro-esophageal reflux disease without esophagitis: Secondary | ICD-10-CM

## 2022-02-14 DIAGNOSIS — G8929 Other chronic pain: Secondary | ICD-10-CM

## 2022-03-23 DIAGNOSIS — M25571 Pain in right ankle and joints of right foot: Secondary | ICD-10-CM | POA: Diagnosis not present

## 2022-03-23 DIAGNOSIS — W19XXXA Unspecified fall, initial encounter: Secondary | ICD-10-CM | POA: Diagnosis not present

## 2022-03-23 DIAGNOSIS — M79604 Pain in right leg: Secondary | ICD-10-CM | POA: Diagnosis not present

## 2022-03-23 DIAGNOSIS — S0990XA Unspecified injury of head, initial encounter: Secondary | ICD-10-CM | POA: Diagnosis not present

## 2022-03-23 DIAGNOSIS — R202 Paresthesia of skin: Secondary | ICD-10-CM | POA: Diagnosis not present

## 2022-03-23 DIAGNOSIS — S96911A Strain of unspecified muscle and tendon at ankle and foot level, right foot, initial encounter: Secondary | ICD-10-CM | POA: Diagnosis not present

## 2022-03-23 DIAGNOSIS — M47812 Spondylosis without myelopathy or radiculopathy, cervical region: Secondary | ICD-10-CM | POA: Diagnosis not present

## 2022-03-23 DIAGNOSIS — S199XXA Unspecified injury of neck, initial encounter: Secondary | ICD-10-CM | POA: Diagnosis not present

## 2022-04-09 ENCOUNTER — Telehealth: Payer: Self-pay | Admitting: Nurse Practitioner

## 2022-04-09 NOTE — Telephone Encounter (Signed)
Left message for patient to call back and schedule Medicare Annual Wellness Visit (AWV) either virtually or phone   Left  my Joshua [name] number [phone]   Last AWV 01/04/21 please schedule with Nurse Health Adviser   45 min for awv-i and in office appointments 30 min for awv-s  phone/virtual appointments

## 2022-04-29 ENCOUNTER — Encounter (HOSPITAL_COMMUNITY): Payer: Self-pay | Admitting: Psychiatry

## 2022-04-29 ENCOUNTER — Telehealth (HOSPITAL_BASED_OUTPATIENT_CLINIC_OR_DEPARTMENT_OTHER): Payer: Medicare HMO | Admitting: Psychiatry

## 2022-04-29 DIAGNOSIS — F411 Generalized anxiety disorder: Secondary | ICD-10-CM | POA: Diagnosis not present

## 2022-04-29 DIAGNOSIS — F331 Major depressive disorder, recurrent, moderate: Secondary | ICD-10-CM

## 2022-04-29 DIAGNOSIS — F5101 Primary insomnia: Secondary | ICD-10-CM | POA: Diagnosis not present

## 2022-04-29 MED ORDER — TRAZODONE HCL 50 MG PO TABS: 50.0000 mg | ORAL_TABLET | Freq: Every evening | ORAL | 0 refills | Status: DC | PRN

## 2022-04-29 MED ORDER — PAROXETINE HCL 40 MG PO TABS: 40.0000 mg | ORAL_TABLET | Freq: Every day | ORAL | 0 refills | Status: DC

## 2022-04-29 MED ORDER — NORTRIPTYLINE HCL 75 MG PO CAPS: 75.0000 mg | ORAL_CAPSULE | Freq: Every day | ORAL | 0 refills | Status: DC

## 2022-04-29 NOTE — Progress Notes (Signed)
Virtual Visit via Telephone Note  I connected with Joshua [name] on 04/29/22 at  3:20 PM EST by telephone and verified that I am speaking with the correct person using two identifiers.  Location: Patient: Home Provider: Home Office   I discussed the limitations, risks, security and privacy concerns of performing an evaluation and management service by telephone and the availability of in person appointments. I also discussed with the patient that there may be a patient responsible charge related to this service. The patient expressed understanding and agreed to proceed.   History of Present Illness: Patient is a Architect.  He is 52 year old daughter Ronal Fear help the translation.  He is no longer taking Abilify because he could not afford but has not seen worsening of symptoms.  Sometimes struggle going to sleep but wakes up very late.  He denies any crying spells or any feeling of hopelessness or worthlessness.  He denies any agitation, anger or any suicidal thoughts.  His appetite is okay.  His energy level is okay.  His weight is unchanged from the past.  He is compliant with Paxil, trazodone and nortriptyline.  He has no tremor or shakes or any EPS.    Past Psychiatric History:  H/O inpatient in [place] in 2008 when walked into traffic.  Tried Risperdal, Cymbalta and temazepam with poor outcome.  Seeing psychiatrist in our office since October 2018.    Psychiatric Specialty Exam: Physical Exam  Review of Systems  Weight 181 lb (82.1 kg).Body mass index is 27.52 kg/m.  General Appearance: NA  Eye Contact:  NA  Speech:  Slow  Volume:  Decreased  Mood:  NA  Affect:  NA  Thought Process:  Descriptions of Associations: Intact  Orientation:  Full (Time, Place, and Person)  Thought Content:  Rumination  Suicidal Thoughts:  No  Homicidal Thoughts:  No  Memory:  Immediate;   Fair Recent;   Fair Remote;   Fair  Judgement:  Fair  Insight:  Fair  Psychomotor Activity:  NA   Concentration:  Concentration: Fair and Attention Span: Fair  Recall:  [employer] of Knowledge:  Fair  Language:   speaks spanish  Akathisia:  No  Handed:  Right  AIMS (if indicated):     Assets:  Communication Skills Desire for Improvement Social Support  ADL's:  Intact  Cognition:  WNL  Sleep:   fair      Assessment and Plan: Major depressive disorder, recurrent.  Anxiety.  Primary insomnia.  Patient not taking Abilify and he feels things are going okay and not worsening of symptoms.  Continue Paxil 40 mg daily, nortriptyline 75 mg at bedtime and trazodone 50 mg at bedtime.  Recommend to call us back if there is any question or any concern.  Follow-up in 3 months.  Follow Up Instructions:    I discussed the assessment and treatment plan with the patient. The patient was provided an opportunity to ask questions and all were answered. The patient agreed with the plan and demonstrated an understanding of the instructions.   The patient was advised to call back or seek an in-person evaluation if the symptoms worsen or if the condition fails to improve as anticipated.  Collaboration of Care: Other provider involved in patient's care AEB notes are available in epic to review.  Patient/Guardian was advised Release of Information must be obtained prior to any record release in order to collaborate their care with an outside provider. Patient/Guardian was advised if they have not  already done so to contact the registration department to sign all necessary forms in order for [place] to release information regarding their care.   Consent: Patient/Guardian gives verbal consent for treatment and assignment of benefits for services provided during this visit. Patient/Guardian expressed understanding and agreed to proceed.    I provided 17 minutes of non-face-to-face time during this encounter.   Kathlee Nations, MD

## 2022-05-13 ENCOUNTER — Other Ambulatory Visit: Payer: Self-pay | Admitting: Family Medicine

## 2022-05-13 ENCOUNTER — Other Ambulatory Visit: Payer: Self-pay | Admitting: Nurse Practitioner

## 2022-05-13 DIAGNOSIS — E782 Mixed hyperlipidemia: Secondary | ICD-10-CM

## 2022-05-13 DIAGNOSIS — G8929 Other chronic pain: Secondary | ICD-10-CM

## 2022-05-13 DIAGNOSIS — K219 Gastro-esophageal reflux disease without esophagitis: Secondary | ICD-10-CM

## 2022-05-13 NOTE — Telephone Encounter (Signed)
Requested Prescriptions  Pending Prescriptions Disp Refills   omeprazole (PRILOSEC) 40 MG capsule [Pharmacy Med Name: OMEPRAZOLE 40 MG Capsule Delayed Release] 90 capsule 0    Sig: TAKE Cashion DAY     Gastroenterology: Proton Pump Inhibitors Passed - 05/13/2022  3:19 AM      Passed - Valid encounter within last 12 months    Recent Outpatient Visits           6 months ago Chronic bilateral low back pain with right-sided sciatica   [place], [place] W, NP   10 months ago Chronic bilateral low back pain with right-sided sciatica   [place] [facility], [place], [place]   1 year ago Chronic bilateral low back pain with right-sided sciatica   [place], Vernia Buff, NP   2 years ago Mixed hyperlipidemia   [place], [place] W, NP   2 years ago Chronic bilateral low back pain with right-sided sciatica   [place], Vernia Buff, NP

## 2022-05-14 ENCOUNTER — Other Ambulatory Visit: Payer: Self-pay | Admitting: Nurse Practitioner

## 2022-05-14 DIAGNOSIS — G8929 Other chronic pain: Secondary | ICD-10-CM

## 2022-05-14 MED ORDER — ACETAMINOPHEN-CODEINE 300-30 MG PO TABS: 1.0000 | ORAL_TABLET | Freq: Two times a day (BID) | ORAL | 0 refills | Status: DC | PRN

## 2022-07-28 ENCOUNTER — Other Ambulatory Visit: Payer: Self-pay | Admitting: Nurse Practitioner

## 2022-07-28 DIAGNOSIS — G8929 Other chronic pain: Secondary | ICD-10-CM

## 2022-07-28 DIAGNOSIS — K219 Gastro-esophageal reflux disease without esophagitis: Secondary | ICD-10-CM

## 2022-07-29 ENCOUNTER — Encounter (HOSPITAL_COMMUNITY): Payer: Self-pay | Admitting: Psychiatry

## 2022-07-29 ENCOUNTER — Telehealth (HOSPITAL_COMMUNITY): Payer: Medicare HMO | Admitting: Psychiatry

## 2022-07-29 ENCOUNTER — Other Ambulatory Visit: Payer: Self-pay | Admitting: Nurse Practitioner

## 2022-07-29 ENCOUNTER — Telehealth (HOSPITAL_BASED_OUTPATIENT_CLINIC_OR_DEPARTMENT_OTHER): Payer: Medicare HMO | Admitting: Psychiatry

## 2022-07-29 VITALS — Wt 180.0 lb

## 2022-07-29 DIAGNOSIS — G8929 Other chronic pain: Secondary | ICD-10-CM

## 2022-07-29 DIAGNOSIS — F411 Generalized anxiety disorder: Secondary | ICD-10-CM | POA: Diagnosis not present

## 2022-07-29 DIAGNOSIS — F331 Major depressive disorder, recurrent, moderate: Secondary | ICD-10-CM | POA: Diagnosis not present

## 2022-07-29 DIAGNOSIS — F5101 Primary insomnia: Secondary | ICD-10-CM | POA: Diagnosis not present

## 2022-07-29 MED ORDER — HALOPERIDOL 1 MG PO TABS: 1.0000 mg | ORAL_TABLET | Freq: Every day | ORAL | 2 refills | Status: DC

## 2022-07-29 MED ORDER — ACETAMINOPHEN-CODEINE 300-30 MG PO TABS
1.0000 | ORAL_TABLET | Freq: Two times a day (BID) | ORAL | 0 refills | Status: DC | PRN
Start: 2022-07-29 — End: 2023-02-22

## 2022-07-29 MED ORDER — NORTRIPTYLINE HCL 75 MG PO CAPS: 75.0000 mg | ORAL_CAPSULE | Freq: Every day | ORAL | 0 refills | Status: DC

## 2022-07-29 MED ORDER — PAROXETINE HCL 40 MG PO TABS: 40.0000 mg | ORAL_TABLET | Freq: Every day | ORAL | 0 refills | Status: DC

## 2022-07-29 MED ORDER — TRAZODONE HCL 50 MG PO TABS: 50.0000 mg | ORAL_TABLET | Freq: Every evening | ORAL | 0 refills | Status: DC | PRN

## 2022-07-29 NOTE — Progress Notes (Signed)
[facility] MD Virtual Progress Note   Patient Location: Home Provider Location: Home Office  I connect with patient by telephone and verified that I am speaking with correct person by using two identifiers. I discussed the limitations of evaluation and management by telemedicine and the availability of in person appointments. I also discussed with the patient that there may be a patient responsible charge related to this service. The patient expressed understanding and agreed to proceed.  Joshua [name] [id number] 52 y.o.  07/29/2022 4:05 PM  History of Present Illness:  Patient is evaluated by phone session.  His son [name] helped the conversation.  Patient reported lately having paranoia, hallucination, feeling scared and fearful.  He does not sleep all night.  He used to take Abilify but stopped after he could not afford.  His son reported patient gets easily upset, agitated and sometimes leaves the house and sleep in the car.  His son also reported that he does talk to himself at night.  However he denies any crying spells, suicidal thoughts, feeling of hopelessness or worthlessness.  His appetite is okay.  He is compliant with Prozac, trazodone and nortriptyline.  He has no tremors or shakes or any EPS.  He has not seen primary care for a while.  He denies drinking or using any illegal substances.  His weight is stable.  Past Psychiatric History: H/O inpatient in [place] in 2008 when walked into traffic.  Tried Risperdal, Cymbalta and temazepam with poor outcome.  Seeing psychiatrist in our office since October 2018.    Outpatient Encounter Medications as of 07/29/2022  Medication Sig   acetaminophen-codeine (TYLENOL #3) 300-30 MG tablet Take 1-2 tablets by mouth every 12 (twelve) hours as needed for moderate pain.   ARIPiprazole (ABILIFY) 2 MG tablet Take 1 tablet (2 mg total) by mouth daily. (Patient not taking: Reported on 04/29/2022)   aspirin 81 MG tablet Take 1  tablet (81 mg total) by mouth daily. (Patient not taking: Reported on 04/20/2020)   atorvastatin (LIPITOR) 40 MG tablet TAKE 1 TABLET EVERY DAY   fluticasone (FLONASE) 50 MCG/ACT nasal spray USE 1 TO 2 SPRAYS IN EACH NOSTRIL EVERY DAY   icosapent Ethyl (VASCEPA) 1 g capsule Take 2 capsules (2 g total) by mouth 2 (two) times daily.   methocarbamol (ROBAXIN) 500 MG tablet Take 1 tablet (500 mg total) by mouth every 6 (six) hours as needed for muscle spasms.   naproxen (NAPROSYN) 500 MG tablet Take 1 tablet (500 mg total) by mouth 2 (two) times daily with a meal. Prn pain   nortriptyline (PAMELOR) 75 MG capsule Take 1 capsule (75 mg total) by mouth at bedtime.   omeprazole (PRILOSEC) 40 MG capsule TAKE 1 CAPSULE EVERY DAY   PARoxetine (PAXIL) 40 MG tablet Take 1 tablet (40 mg total) by mouth daily.   traZODone (DESYREL) 50 MG tablet Take 1 tablet (50 mg total) by mouth at bedtime as needed. for sleep   triamcinolone (KENALOG) 0.025 % ointment APPLY 1 APPLICATION TOPICALLY 2 (TWO) TIMES DAILY.   No facility-administered encounter medications on file as of 07/29/2022.    No results found for this or any previous visit (from the past 2160 hour(s)).   Psychiatric Specialty Exam: Physical Exam  Review of Systems  Weight 180 lb (81.6 kg).There is no height or weight on file to calculate BMI.  General Appearance: NA  Eye Contact:  NA  Speech:  Slow  Volume:  Decreased  Mood:  Euthymic  Affect:  NA  Thought Process:  Descriptions of Associations: Intact  Orientation:  Full (Time, Place, and Person)  Thought Content:  Hallucinations: Auditory People watching and calling his name, Paranoid Ideation, and Rumination  Suicidal Thoughts:  No  Homicidal Thoughts:  No  Memory:  Immediate;   Fair Recent;   Fair Remote;   Fair  Judgement:  Fair  Insight:  Shallow  Psychomotor Activity:  NA  Concentration:  Concentration: Fair and Attention Span: Fair  Recall:  [employer] of Knowledge:  Fair   Language:   speaks spanish  Akathisia:  No  Handed:  Right  AIMS (if indicated):     Assets:  Desire for Improvement Housing Social Support  ADL's:  Intact  Cognition:  WNL  Sleep:  frequent awakening     Assessment/Plan: MDD (major depressive disorder), recurrent episode, moderate - Plan: haloperidol (HALDOL) 1 MG tablet, traZODone (DESYREL) 50 MG tablet, PARoxetine (PAXIL) 40 MG tablet, nortriptyline (PAMELOR) 75 MG capsule  Generalized anxiety disorder - Plan: traZODone (DESYREL) 50 MG tablet, PARoxetine (PAXIL) 40 MG tablet, nortriptyline (PAMELOR) 75 MG capsule  Primary insomnia - Plan: traZODone (DESYREL) 50 MG tablet  Discussed symptoms.  Patient does not want to go back to Abilify which helped him but he could not afford.  He was only taking 2 mg.  In the past he had tried Risperdal but do not recall very well.  Like to try a different medication.  I recommend he can try Haldol 1 mg at bedtime to help his paranoia, hallucination, sleep.  He agreed to give a try.  We will continue nortriptyline 75 mg at bedtime, trazodone 50 mg at bedtime, Paxil 40 mg daily and Haldol 1 mg at bedtime.  I recommend to call us back if is any question or any concern.  Follow-up in 3 months.  Discussed medication side effect specially tremor or shakes from antipsychotic.  I also encourage to keep appointment with primary care and had blood work.   Follow Up Instructions:     I discussed the assessment and treatment plan with the patient. The patient was provided an opportunity to ask questions and all were answered. The patient agreed with the plan and demonstrated an understanding of the instructions.   The patient was advised to call back or seek an in-person evaluation if the symptoms worsen or if the condition fails to improve as anticipated.    Collaboration of Care: Other provider involved in patient's care AEB notes are available in epic to review.  Patient/Guardian was advised Release of  Information must be obtained prior to any record release in order to collaborate their care with an outside provider. Patient/Guardian was advised if they have not already done so to contact the registration department to sign all necessary forms in order for [place] to release information regarding their care.   Consent: Patient/Guardian gives verbal consent for treatment and assignment of benefits for services provided during this visit. Patient/Guardian expressed understanding and agreed to proceed.     I provided 25 minutes of non face to face time during this encounter.  Note: This document was prepared by [employer] voice dictation technology and any errors that results from this process are unintentional.    Cleotis Nipper, MD 07/29/2022

## 2022-10-14 ENCOUNTER — Other Ambulatory Visit: Payer: Self-pay | Admitting: Family Medicine

## 2022-10-14 DIAGNOSIS — K219 Gastro-esophageal reflux disease without esophagitis: Secondary | ICD-10-CM

## 2022-10-14 DIAGNOSIS — G8929 Other chronic pain: Secondary | ICD-10-CM

## 2022-10-29 ENCOUNTER — Encounter (HOSPITAL_COMMUNITY): Payer: Self-pay | Admitting: Psychiatry

## 2022-10-29 ENCOUNTER — Telehealth (HOSPITAL_BASED_OUTPATIENT_CLINIC_OR_DEPARTMENT_OTHER): Payer: Medicare HMO | Admitting: Psychiatry

## 2022-10-29 DIAGNOSIS — F411 Generalized anxiety disorder: Secondary | ICD-10-CM | POA: Diagnosis not present

## 2022-10-29 DIAGNOSIS — F5101 Primary insomnia: Secondary | ICD-10-CM

## 2022-10-29 DIAGNOSIS — F331 Major depressive disorder, recurrent, moderate: Secondary | ICD-10-CM | POA: Diagnosis not present

## 2022-10-29 MED ORDER — TRAZODONE HCL 50 MG PO TABS: 50.0000 mg | ORAL_TABLET | Freq: Every evening | ORAL | 0 refills | Status: DC | PRN

## 2022-10-29 MED ORDER — NORTRIPTYLINE HCL 75 MG PO CAPS
75.0000 mg | ORAL_CAPSULE | Freq: Every day | ORAL | 0 refills | Status: DC
Start: 2022-10-29 — End: 2023-06-11

## 2022-10-29 MED ORDER — PAROXETINE HCL 40 MG PO TABS
40.0000 mg | ORAL_TABLET | Freq: Every day | ORAL | 0 refills | Status: DC
Start: 2022-10-29 — End: 2023-06-11

## 2022-10-29 MED ORDER — HALOPERIDOL 1 MG PO TABS
1.0000 mg | ORAL_TABLET | Freq: Every day | ORAL | 2 refills | Status: DC
Start: 2022-10-29 — End: 2023-06-11

## 2022-10-29 NOTE — Progress Notes (Signed)
[facility] MD Virtual Progress Note   Patient Location: Home Provider Location: Home Office  I connect with patient by telephone and verified that I am speaking with correct person by using two identifiers. I discussed the limitations of evaluation and management by telemedicine and the availability of in person appointments. I also discussed with the patient that there may be a patient responsible charge related to this service. The patient expressed understanding and agreed to proceed.  Joshua [name] [id number] 52 y.o.  10/29/2022 3:07 PM  History of Present Illness:  Patient is evaluated by phone session.  His son Joshua [name] was present to help the conversation.  Patient was given Haldol on the last visit after he could not afford Abilify and resulting in decompensation.  He was talking to himself, getting irritable, agitated.  Patient told the new medicine is working very well for him.  He is more calm.  He called to sleep all night.  He has no tremors, shakes or any EPS.  Denies any paranoia or any suicidal thoughts.  His appetite is okay.  His weight is stable.  He is also taking Paxil, trazodone and nortriptyline.  Denies drinking or using any illegal substances.  Past Psychiatric History: H/O inpatient in [place] in 2008 when walked into traffic.  Tried Risperdal, Cymbalta and temazepam with poor outcome.  Seeing psychiatrist in our office since October 2018. Abilify helped but couldn't afford.      Outpatient Encounter Medications as of 10/29/2022  Medication Sig   acetaminophen-codeine (TYLENOL #3) 300-30 MG tablet Take 1-2 tablets by mouth every 12 (twelve) hours as needed for moderate pain.   ARIPiprazole (ABILIFY) 2 MG tablet Take 1 tablet (2 mg total) by mouth daily. (Patient not taking: Reported on 04/29/2022)   aspirin 81 MG tablet Take 1 tablet (81 mg total) by mouth daily. (Patient not taking: Reported on 04/20/2020)   atorvastatin (LIPITOR) 40 MG tablet TAKE  1 TABLET EVERY DAY   fluticasone (FLONASE) 50 MCG/ACT nasal spray USE 1 TO 2 SPRAYS IN EACH NOSTRIL EVERY DAY   haloperidol (HALDOL) 1 MG tablet Take 1 tablet (1 mg total) by mouth at bedtime.   icosapent Ethyl (VASCEPA) 1 g capsule Take 2 capsules (2 g total) by mouth 2 (two) times daily.   methocarbamol (ROBAXIN) 500 MG tablet Take 1 tablet (500 mg total) by mouth every 6 (six) hours as needed for muscle spasms.   naproxen (NAPROSYN) 500 MG tablet Take 1 tablet (500 mg total) by mouth 2 (two) times daily with a meal. Prn pain   nortriptyline (PAMELOR) 75 MG capsule Take 1 capsule (75 mg total) by mouth at bedtime.   omeprazole (PRILOSEC) 40 MG capsule TAKE 1 CAPSULE EVERY DAY   PARoxetine (PAXIL) 40 MG tablet Take 1 tablet (40 mg total) by mouth daily.   traZODone (DESYREL) 50 MG tablet Take 1 tablet (50 mg total) by mouth at bedtime as needed. for sleep   triamcinolone (KENALOG) 0.025 % ointment APPLY 1 APPLICATION TOPICALLY 2 (TWO) TIMES DAILY.   No facility-administered encounter medications on file as of 10/29/2022.    No results found for this or any previous visit (from the past 2160 hour(s)).   Psychiatric Specialty Exam: Physical Exam  Review of Systems  Weight 180 lb (81.6 kg).There is no height or weight on file to calculate BMI.  General Appearance: NA  Eye Contact:  NA  Speech:  Slow  Volume:  Normal  Mood:  Euthymic  Affect:  NA  Thought Process:  Goal Directed  Orientation:  Full (Time, Place, and Person)  Thought Content:  Logical  Suicidal Thoughts:  No  Homicidal Thoughts:  No  Memory:  Immediate;   Fair Recent;   Fair Remote;   Fair  Judgement:  Intact  Insight:  Present  Psychomotor Activity:  NA  Concentration:  Concentration: Fair and Attention Span: Fair  Recall:  [employer] of Knowledge:  Fair  Language:  Fair  Akathisia:  No  Handed:  Right  AIMS (if indicated):     Assets:  Desire for Improvement Housing Social Support Transportation  ADL's:   Intact  Cognition:  WNL  Sleep:  ok     Assessment/Plan: MDD (major depressive disorder), recurrent episode, moderate (HCC) - Plan: haloperidol (HALDOL) 1 MG tablet, nortriptyline (PAMELOR) 75 MG capsule, traZODone (DESYREL) 50 MG tablet, PARoxetine (PAXIL) 40 MG tablet  Generalized anxiety disorder - Plan: nortriptyline (PAMELOR) 75 MG capsule, traZODone (DESYREL) 50 MG tablet, PARoxetine (PAXIL) 40 MG tablet  Primary insomnia - Plan: traZODone (DESYREL) 50 MG tablet  Patient is stable and doing better on low-dose Abilify.  His sleep is good and denies any paranoia, agitation, hallucination or any suicidal thoughts.  Continue nortriptyline 75 mg at bedtime, trazodone 50 mg at bedtime, Paxil 40 mg daily and Haldol 1 mg at bedtime.  Encouraged to have a visit with primary care for blood work.  Recommended to call us back with any question or any concern.  Follow-up in 3 months   Follow Up Instructions:     I discussed the assessment and treatment plan with the patient. The patient was provided an opportunity to ask questions and all were answered. The patient agreed with the plan and demonstrated an understanding of the instructions.   The patient was advised to call back or seek an in-person evaluation if the symptoms worsen or if the condition fails to improve as anticipated.    Collaboration of Care: Other provider involved in patient's care AEB notes are available in epic to review.  Patient/Guardian was advised Release of Information must be obtained prior to any record release in order to collaborate their care with an outside provider. Patient/Guardian was advised if they have not already done so to contact the registration department to sign all necessary forms in order for [place] to release information regarding their care.   Consent: Patient/Guardian gives verbal consent for treatment and assignment of benefits for services provided during this visit. Patient/Guardian expressed  understanding and agreed to proceed.     I provided 11 minutes of non face to face time during this encounter.  Note: This document was prepared by [employer] voice dictation technology and any errors that results from this process are unintentional.    Cleotis Nipper, MD 10/29/2022

## 2023-01-28 ENCOUNTER — Telehealth (HOSPITAL_COMMUNITY): Payer: Medicare HMO | Admitting: Psychiatry

## 2023-02-20 ENCOUNTER — Other Ambulatory Visit (HOSPITAL_COMMUNITY): Payer: Self-pay | Admitting: Psychiatry

## 2023-02-20 ENCOUNTER — Other Ambulatory Visit: Payer: Self-pay | Admitting: Nurse Practitioner

## 2023-02-20 DIAGNOSIS — F331 Major depressive disorder, recurrent, moderate: Secondary | ICD-10-CM

## 2023-02-20 DIAGNOSIS — F411 Generalized anxiety disorder: Secondary | ICD-10-CM

## 2023-02-20 DIAGNOSIS — G8929 Other chronic pain: Secondary | ICD-10-CM

## 2023-02-20 DIAGNOSIS — F5101 Primary insomnia: Secondary | ICD-10-CM

## 2023-03-04 ENCOUNTER — Encounter (HOSPITAL_COMMUNITY): Payer: Self-pay

## 2023-03-04 ENCOUNTER — Telehealth (HOSPITAL_BASED_OUTPATIENT_CLINIC_OR_DEPARTMENT_OTHER): Payer: Self-pay | Admitting: Psychiatry

## 2023-03-04 DIAGNOSIS — Z91199 Patient's noncompliance with other medical treatment and regimen due to unspecified reason: Secondary | ICD-10-CM

## 2023-03-04 NOTE — Progress Notes (Signed)
Patient is no show.  I called [phone] and left message.

## 2023-03-28 DIAGNOSIS — R112 Nausea with vomiting, unspecified: Secondary | ICD-10-CM | POA: Diagnosis not present

## 2023-03-28 DIAGNOSIS — R109 Unspecified abdominal pain: Secondary | ICD-10-CM | POA: Diagnosis not present

## 2023-03-28 DIAGNOSIS — K529 Noninfective gastroenteritis and colitis, unspecified: Secondary | ICD-10-CM | POA: Diagnosis not present

## 2023-06-11 ENCOUNTER — Telehealth (HOSPITAL_COMMUNITY): Payer: Medicare HMO | Admitting: Psychiatry

## 2023-06-11 ENCOUNTER — Encounter (HOSPITAL_COMMUNITY): Payer: Self-pay | Admitting: Psychiatry

## 2023-06-11 VITALS — Wt 187.0 lb

## 2023-06-11 DIAGNOSIS — F331 Major depressive disorder, recurrent, moderate: Secondary | ICD-10-CM | POA: Diagnosis not present

## 2023-06-11 DIAGNOSIS — F411 Generalized anxiety disorder: Secondary | ICD-10-CM | POA: Diagnosis not present

## 2023-06-11 DIAGNOSIS — F5101 Primary insomnia: Secondary | ICD-10-CM | POA: Diagnosis not present

## 2023-06-11 MED ORDER — PAROXETINE HCL 40 MG PO TABS
40.0000 mg | ORAL_TABLET | Freq: Every day | ORAL | 0 refills | Status: DC
Start: 2023-06-11 — End: 2023-09-10

## 2023-06-11 MED ORDER — TRAZODONE HCL 50 MG PO TABS: 50.0000 mg | ORAL_TABLET | Freq: Every evening | ORAL | 0 refills | Status: DC | PRN

## 2023-06-11 MED ORDER — HALOPERIDOL 1 MG PO TABS
1.0000 mg | ORAL_TABLET | Freq: Every day | ORAL | 2 refills | Status: DC
Start: 2023-06-11 — End: 2023-09-10

## 2023-06-11 MED ORDER — NORTRIPTYLINE HCL 75 MG PO CAPS
75.0000 mg | ORAL_CAPSULE | Freq: Every day | ORAL | 0 refills | Status: DC
Start: 2023-06-11 — End: 2023-09-10

## 2023-06-11 NOTE — Progress Notes (Signed)
 [facility] MD/PA/NP OP Progress Note  Patient location; home Provider location; office  I connected with the patient by telephone and verified that I am speaking with the correct person by using 2 identifiers.  I discussed the limitation of evaluation and managed by telemedicine and availability of in person appointments.  I also discussed with the patient that there may be a patient responsibility charges related to the service.  Patient expressed understanding and agreed to proceed.  06/11/2023 6:04 PM Joshua [name]  MRN:  [id number]  Chief Complaint:  Chief Complaint  Patient presents with   Hallucinations   Anxiety   Follow-up   Medication Refill   HPI: Patient is evaluated by phone session.  His son Trinna Post was present throughout the conversation.  We have requested him to have in person visit or video session but patient was unable to do either.  He promised to have in person visit in the future.  He had missed the last appointment and ran out of his medication.  He reported started to have hallucination, paranoia, anxiety, poor sleep.  His son mentioned that patient is struggling with his mood and gets sometimes irritable.  He does not leave the house believed people following him.  He reported medicine was working until he ran out.  He is taking Haldol 1 mg after he could not afford Abilify.  He has not seen primary care in a while and had no blood work recently.  His appetite is okay.  He admitted weight gain as does not do any exercise or walking.  He has no tremors, shakes or any EPS.  He like to get all his medication which had helped him.  He is on trazodone, Paxil, nortriptyline and low-dose Haldol.  Denies drinking or using any illegal substances.  Visit Diagnosis:    ICD-10-CM   1. MDD (major depressive disorder), recurrent episode, moderate (HCC)  F33.1 haloperidol (HALDOL) 1 MG tablet    nortriptyline (PAMELOR) 75 MG capsule    PARoxetine (PAXIL) 40 MG tablet    traZODone (DESYREL)  50 MG tablet    2. Generalized anxiety disorder  F41.1 nortriptyline (PAMELOR) 75 MG capsule    PARoxetine (PAXIL) 40 MG tablet    traZODone (DESYREL) 50 MG tablet    3. Primary insomnia  F51.01 traZODone (DESYREL) 50 MG tablet      Past Psychiatric History: Reviewed H/O inpatient in [place] in 2008 when walked into traffic.  Tried Risperdal, Cymbalta and temazepam with poor outcome.  Seeing psychiatrist in our office since October 2018. Abilify helped but couldn't afford.   Past Medical History:  Past Medical History:  Diagnosis Date   Anxiety    Back pain with right-sided radiculopathy    Depression    High cholesterol    No past surgical history on file.  Family Psychiatric History: Reviewed  Family History:  Family History  Problem Relation Age of Onset   Hypertension Paternal Uncle    Diabetes Paternal Uncle     Social History:  Social History   Socioeconomic History   Marital status: Married    Spouse name: Not on file   Number of children: Not on file   Years of education: Not on file   Highest education level: Not on file  Occupational History   Not on file  Tobacco Use   Smoking status: Never   Smokeless tobacco: Never  Vaping Use   Vaping status: Never Used  Substance and Sexual Activity   Alcohol use:  No   Drug use: No   Sexual activity: Yes    Birth control/protection: None  Other Topics Concern   Not on file  Social History Narrative   Not on file   Social Drivers of Health   Financial Resource Strain: High Risk (01/04/2021)   Overall Financial Resource Strain (CARDIA)    Difficulty of Paying Living Expenses: Very hard  Food Insecurity: Food Insecurity Present (01/04/2021)   Hunger Vital Sign    Worried About Running Out of Food in the Last Year: Often true    Ran Out of Food in the Last Year: Often true  Transportation Needs: No Transportation Needs (01/04/2021)   PRAPARE - Administrator, Civil Service (Medical): No     Lack of Transportation (Non-Medical): No  Physical Activity: Insufficiently Active (01/04/2021)   Exercise Vital Sign    Days of Exercise per Week: 7 days    Minutes of Exercise per Session: 20 min  Stress: Stress Concern Present (01/04/2021)   [employer] of Occupational Health - Occupational Stress Questionnaire    Feeling of Stress : Very much  Social Connections: Moderately Integrated (01/04/2021)   Social Connection and Isolation Panel [NHANES]    Frequency of Communication with Friends and Family: Once a week    Frequency of Social Gatherings with Friends and Family: More than three times a week    Attends Religious Services: More than 4 times per year    Active Member of [employer] or Organizations: No    Attends Banker Meetings: Never    Marital Status: Married    Allergies:  Allergies  Allergen Reactions   Flexeril [Cyclobenzaprine]     Pt turns very aggressive    Metabolic Disorder Labs: No results found for: "HGBA1C", "MPG" No results found for: "PROLACTIN" Lab Results  Component Value Date   CHOL 228 (H) 10/16/2021   TRIG 173 (H) 10/16/2021   HDL 36 (L) 10/16/2021   CHOLHDL 6.3 (H) 10/16/2021   LDLCALC 160 (H) 10/16/2021   LDLCALC 167 (H) 12/31/2020   No results found for: "TSH"  Therapeutic Level Labs: No results found for: "LITHIUM" No results found for: "VALPROATE" No results found for: "CBMZ"  Current Medications: Current Outpatient Medications  Medication Sig Dispense Refill   acetaminophen-codeine (TYLENOL #3) 300-30 MG tablet Take 1-2 tablets by mouth every 12 (twelve) hours as needed for moderate pain (pain score 4-6). 60 tablet 0   ARIPiprazole (ABILIFY) 2 MG tablet Take 1 tablet (2 mg total) by mouth daily. (Patient not taking: Reported on 04/29/2022) 90 tablet 0   aspirin 81 MG tablet Take 1 tablet (81 mg total) by mouth daily. (Patient not taking: Reported on 04/20/2020) 90 tablet 3   atorvastatin (LIPITOR) 40 MG tablet TAKE 1 TABLET  EVERY DAY 90 tablet 0   fluticasone (FLONASE) 50 MCG/ACT nasal spray USE 1 TO 2 SPRAYS IN EACH NOSTRIL EVERY DAY 16 g 0   haloperidol (HALDOL) 1 MG tablet Take 1 tablet (1 mg total) by mouth at bedtime. 30 tablet 2   icosapent Ethyl (VASCEPA) 1 g capsule Take 2 capsules (2 g total) by mouth 2 (two) times daily. 360 capsule 1   methocarbamol (ROBAXIN) 500 MG tablet Take 1 tablet (500 mg total) by mouth every 6 (six) hours as needed for muscle spasms. 90 tablet 3   naproxen (NAPROSYN) 500 MG tablet Take 1 tablet (500 mg total) by mouth 2 (two) times daily with a meal. Prn pain 60 tablet  0   nortriptyline (PAMELOR) 75 MG capsule Take 1 capsule (75 mg total) by mouth at bedtime. 90 capsule 0   omeprazole (PRILOSEC) 40 MG capsule TAKE 1 CAPSULE EVERY DAY 30 capsule 0   PARoxetine (PAXIL) 40 MG tablet Take 1 tablet (40 mg total) by mouth daily. 90 tablet 0   traZODone (DESYREL) 50 MG tablet Take 1 tablet (50 mg total) by mouth at bedtime as needed. for sleep 90 tablet 0   triamcinolone (KENALOG) 0.025 % ointment APPLY 1 APPLICATION TOPICALLY 2 (TWO) TIMES DAILY. 320 g 1   No current facility-administered medications for this visit.     Psychiatric Specialty Exam: Review of Systems  Weight 187 lb (84.8 kg).Body mass index is 28.43 kg/m.  General Appearance: NA  Eye Contact:  NA  Speech:  Slow  Volume:  Decreased  Mood:  Anxious and Dysphoric  Affect:  NA  Thought Process:  Descriptions of Associations: Intact  Orientation:  Full (Time, Place, and Person)  Thought Content: Hallucinations: Auditory, Paranoid Ideation, and Rumination   Suicidal Thoughts:  No  Homicidal Thoughts:  No  Memory:  Immediate;   Fair Recent;   Fair Remote;   Fair  Judgement:  Fair  Insight:  Shallow  Psychomotor Activity:  Decreased  Concentration:  Concentration: Fair and Attention Span: Fair  Recall:  [employer] of Knowledge: Fair  Language: Fair  Akathisia:  No  Handed:  Right  AIMS (if indicated): not  done  Assets:  Communication Skills Desire for Improvement Housing Social Support  ADL's:  Intact  Cognition: WNL  Sleep:  Fair   Screenings: GAD-7    Garment/textile technologist Visit from 10/16/2021 in [facility] [place] [facility] Of [place]. [facility] Office Visit from 04/20/2020 in [facility] [place] - A Dept Of [place]a Bridegroom. [facility] Office Visit from 10/19/2019 in [facility] [place] - A Dept Of Eligha Bridegroom. [facility] Office Visit from 02/22/2018 in [facility] [place] - A Dept Of Eligha Bridegroom. [facility] Office Visit from 09/21/2017 in [facility] [place] - A Dept Of Eligha Bridegroom. [facility]  Total GAD-7 Score 18 15 14 9 20       PHQ2-9    Flowsheet Row Office Visit from 10/16/2021 in [facility] [place] - A Dept Of . [facility] Office Visit from 07/11/2021 in [facility] [place] - A Dept Of Eligha Bridegroom. [facility] Clinical Support from 01/04/2021 in [facility] - A Dept Of [place]igha Bridegroom. [facility] Office Visit from 04/20/2020 in [facility] [place] - A Dept Of Eligha Bridegroom. [facility] Office Visit from 10/19/2019 in [facility] [place] - A Dept Of Eligha Bridegroom. [facility]  PHQ-2 Total Score 5 3 4 4 4   PHQ-9 Total Score 22 19 15 17 15       Flowsheet Row Office Visit from 09/27/2020 in [facility] Video Visit from 06/25/2020 in [facility]  C-SSRS RISK CATEGORY No Risk No Risk        Assessment and Plan: Patient is 53 year old man with a history of lower back pain, hyperlipidemia taking multiple psychotropic medication and currently out of refills.  He started to decompensate into his symptoms.  He had missed appointment.  When he was taking medication he has  no concerns or side effects.  We will restart  nortriptyline 75 mg at bedtime, Paxil 40 mg daily, trazodone 50 mg at bedtime and Haldol 1 mg at bedtime.  I emphasized that he need to see in person next time.  He has not seen his primary care and has not had blood work in a while.  Encouraged to make appointment with primary care and physical.  Discussed medication requires close monitoring of the labs.  Patient acknowledged and agreed to have in person visit and he will contact the primary care for labs.  Recommended to call us back if is any question or any concern.  Follow-up in 3 months  Collaboration of Care: Collaboration of Care: Other provider involved in patient's care AEB notes are available in epic to review  Patient/Guardian was advised Release of Information must be obtained prior to any record release in order to collaborate their care with an outside provider. Patient/Guardian was advised if they have not already done so to contact the registration department to sign all necessary forms in order for [place] to release information regarding their care.   Consent: Patient/Guardian gives verbal consent for treatment and assignment of benefits for services provided during this visit. Patient/Guardian expressed understanding and agreed to proceed.   I provided 25 minutes of non-face-to-face time during this encounter.  Cleotis Nipper, MD 06/11/2023, 6:04 PM

## 2023-09-10 ENCOUNTER — Ambulatory Visit (HOSPITAL_COMMUNITY): Payer: Medicare HMO | Admitting: Psychiatry

## 2023-09-10 ENCOUNTER — Encounter (HOSPITAL_COMMUNITY): Payer: Self-pay | Admitting: Psychiatry

## 2023-09-10 DIAGNOSIS — F411 Generalized anxiety disorder: Secondary | ICD-10-CM

## 2023-09-10 DIAGNOSIS — F331 Major depressive disorder, recurrent, moderate: Secondary | ICD-10-CM

## 2023-09-10 DIAGNOSIS — F5101 Primary insomnia: Secondary | ICD-10-CM

## 2023-09-10 MED ORDER — NORTRIPTYLINE HCL 75 MG PO CAPS: 75.0000 mg | ORAL_CAPSULE | Freq: Every day | ORAL | 0 refills | Status: DC

## 2023-09-10 MED ORDER — HALOPERIDOL 1 MG PO TABS: 1.0000 mg | ORAL_TABLET | Freq: Every day | ORAL | 2 refills | Status: DC

## 2023-09-10 MED ORDER — TRAZODONE HCL 50 MG PO TABS: 50.0000 mg | ORAL_TABLET | Freq: Every evening | ORAL | 0 refills | Status: DC | PRN

## 2023-09-10 MED ORDER — PAROXETINE HCL 40 MG PO TABS: 40.0000 mg | ORAL_TABLET | Freq: Every day | ORAL | 0 refills | Status: DC
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
## 2023-10-07 ENCOUNTER — Other Ambulatory Visit: Payer: Self-pay | Admitting: Nurse Practitioner

## 2023-10-07 DIAGNOSIS — G8929 Other chronic pain: Secondary | ICD-10-CM

## 2023-12-10 ENCOUNTER — Ambulatory Visit (HOSPITAL_COMMUNITY): Admitting: Psychiatry

## 2023-12-23 ENCOUNTER — Other Ambulatory Visit (HOSPITAL_COMMUNITY): Payer: Self-pay | Admitting: Psychiatry

## 2023-12-23 DIAGNOSIS — F331 Major depressive disorder, recurrent, moderate: Secondary | ICD-10-CM

## 2023-12-23 DIAGNOSIS — F411 Generalized anxiety disorder: Secondary | ICD-10-CM

## 2023-12-23 DIAGNOSIS — F5101 Primary insomnia: Secondary | ICD-10-CM

## 2023-12-31 ENCOUNTER — Ambulatory Visit (HOSPITAL_COMMUNITY): Admitting: Psychiatry

## 2023-12-31 ENCOUNTER — Other Ambulatory Visit: Payer: Self-pay

## 2023-12-31 ENCOUNTER — Encounter (HOSPITAL_COMMUNITY): Payer: Self-pay | Admitting: Psychiatry

## 2023-12-31 VITALS — BP 149/77 | HR 80 | Ht 68.0 in | Wt 181.0 lb

## 2023-12-31 DIAGNOSIS — F411 Generalized anxiety disorder: Secondary | ICD-10-CM

## 2023-12-31 DIAGNOSIS — F5101 Primary insomnia: Secondary | ICD-10-CM | POA: Diagnosis not present

## 2023-12-31 DIAGNOSIS — F331 Major depressive disorder, recurrent, moderate: Secondary | ICD-10-CM | POA: Diagnosis not present

## 2023-12-31 MED ORDER — PAROXETINE HCL 40 MG PO TABS: 40.0000 mg | ORAL_TABLET | Freq: Every day | ORAL | 0 refills | Status: DC

## 2023-12-31 MED ORDER — HALOPERIDOL 0.5 MG PO TABS: 0.5000 mg | ORAL_TABLET | Freq: Every day | ORAL | 2 refills | Status: DC

## 2023-12-31 MED ORDER — TRAZODONE HCL 50 MG PO TABS: 50.0000 mg | ORAL_TABLET | Freq: Every evening | ORAL | 0 refills | Status: DC | PRN

## 2023-12-31 MED ORDER — NORTRIPTYLINE HCL 75 MG PO CAPS: 75.0000 mg | ORAL_CAPSULE | Freq: Every day | ORAL | 0 refills | Status: DC

## 2023-12-31 NOTE — Progress Notes (Signed)
 BH MD/PA/NP OP Progress Note  12/31/2023 3:52 PM Joshua [name]  MRN:  [id number]  Chief Complaint:  Chief Complaint  Patient presents with   Follow-up   Medication Refill   HPI: Patient came today to the office for his appointment.  Session was conducted with the help of translator.  His son was also present.  Patient reported things are going well and he had a nice vacation at [place]  He reported chronic paranoia and anxiety but denies any hallucination or any suicidal thoughts.  He still reported sometime people watching him and feel anxious around crowded places.  Occasionally nightmares and flashbacks but overall sleep is better.  He also reported mild tremors in his hand and sometime muscle spasm.  Denies any crying spells, drug use.  His son is very supportive and takes him to the doctor's appointment.  He has not seen PCP in a while but had appointment coming up in 2 weeks.  His appetite is okay.  His weight is stable.  Denies any hopelessness or worthlessness.  He denies any major panic attack.  He admitted since not taking the medication for past few days as he ran out his symptoms are coming back.  Visit Diagnosis:    ICD-10-CM   1. MDD (major depressive disorder), recurrent episode, moderate (HCC)  F33.1 haloperidol  (HALDOL ) 0.5 MG tablet    nortriptyline  (PAMELOR ) 75 MG capsule    PARoxetine  (PAXIL ) 40 MG tablet    traZODone  (DESYREL ) 50 MG tablet    2. Generalized anxiety disorder  F41.1 nortriptyline  (PAMELOR ) 75 MG capsule    PARoxetine  (PAXIL ) 40 MG tablet    traZODone  (DESYREL ) 50 MG tablet    3. Primary insomnia  F51.01 traZODone  (DESYREL ) 50 MG tablet       Past Psychiatric History: Reviewed H/O inpatient in [place] in 2008 when walked into traffic.  Tried Risperdal, Cymbalta  and temazepam with poor outcome.  Seeing psychiatrist in our office since October 2018. Abilify  helped but couldn't afford.   Past Medical History:  Past Medical History:   Diagnosis Date   Anxiety    Back pain with right-sided radiculopathy    Depression    High cholesterol    No past surgical history on file.  Family Psychiatric History: Reviewed  Family History:  Family History  Problem Relation Age of Onset   Hypertension Paternal Uncle    Diabetes Paternal Uncle     Social History:  Social History   Socioeconomic History   Marital status: Married    Spouse name: Not on file   Number of children: Not on file   Years of education: Not on file   Highest education level: Not on file  Occupational History   Not on file  Tobacco Use   Smoking status: Never   Smokeless tobacco: Never  Vaping Use   Vaping status: Never Used  Substance and Sexual Activity   Alcohol use: No   Drug use: No   Sexual activity: Yes    Birth control/protection: None  Other Topics Concern   Not on file  Social History Narrative   Not on file   Social Drivers of Health   Financial Resource Strain: High Risk (01/04/2021)   Overall Financial Resource Strain (CARDIA)    Difficulty of Paying Living Expenses: Very hard  Food Insecurity: Food Insecurity Present (01/04/2021)   Hunger Vital Sign    Worried About Running Out of Food in the Last Year: Often true    Ran  Out of Food in the Last Year: Often true  Transportation Needs: No Transportation Needs (01/04/2021)   PRAPARE - Administrator, Civil Service (Medical): No    Lack of Transportation (Non-Medical): No  Physical Activity: Insufficiently Active (01/04/2021)   Exercise Vital Sign    Days of Exercise per Week: 7 days    Minutes of Exercise per Session: 20 min  Stress: Stress Concern Present (01/04/2021)   [employer] of Occupational Health - Occupational Stress Questionnaire    Feeling of Stress : Very much  Social Connections: Moderately Integrated (01/04/2021)   Social Connection and Isolation Panel    Frequency of Communication with Friends and Family: Once a week    Frequency of  Social Gatherings with Friends and Family: More than three times a week    Attends Religious Services: More than 4 times per year    Active Member of [employer] or Organizations: No    Attends Banker Meetings: Never    Marital Status: Married    Allergies:  Allergies  Allergen Reactions   Flexeril  [Cyclobenzaprine ]     Pt turns very aggressive    Metabolic Disorder Labs: No results found for: HGBA1C, MPG No results found for: PROLACTIN Lab Results  Component Value Date   CHOL 228 (H) 10/16/2021   TRIG 173 (H) 10/16/2021   HDL 36 (L) 10/16/2021   CHOLHDL 6.3 (H) 10/16/2021   LDLCALC 160 (H) 10/16/2021   LDLCALC 167 (H) 12/31/2020   No results found for: TSH  Therapeutic Level Labs: No results found for: LITHIUM No results found for: VALPROATE No results found for: CBMZ  Current Medications: Current Outpatient Medications  Medication Sig Dispense Refill   acetaminophen -codeine  (TYLENOL  #3) 300-30 MG tablet Take 1-2 tablets by mouth every 12 (twelve) hours as needed for moderate pain (pain score 4-6). 60 tablet 0   atorvastatin  (LIPITOR) 40 MG tablet TAKE 1 TABLET EVERY DAY 90 tablet 0   fluticasone  (FLONASE ) 50 MCG/ACT nasal spray USE 1 TO 2 SPRAYS IN EACH NOSTRIL EVERY DAY 16 g 0   haloperidol  (HALDOL ) 1 MG tablet Take 1 tablet (1 mg total) by mouth at bedtime. 30 tablet 2   icosapent  Ethyl (VASCEPA ) 1 g capsule Take 2 capsules (2 g total) by mouth 2 (two) times daily. 360 capsule 1   methocarbamol  (ROBAXIN ) 500 MG tablet Take 1 tablet (500 mg total) by mouth every 6 (six) hours as needed for muscle spasms. 90 tablet 3   naproxen  (NAPROSYN ) 500 MG tablet Take 1 tablet (500 mg total) by mouth 2 (two) times daily with a meal. Prn pain 60 tablet 0   nortriptyline  (PAMELOR ) 75 MG capsule Take 1 capsule (75 mg total) by mouth at bedtime. 90 capsule 0   omeprazole  (PRILOSEC) 40 MG capsule TAKE 1 CAPSULE EVERY DAY 30 capsule 0   PARoxetine  (PAXIL ) 40 MG  tablet Take 1 tablet (40 mg total) by mouth daily. 90 tablet 0   traZODone  (DESYREL ) 50 MG tablet Take 1 tablet (50 mg total) by mouth at bedtime as needed. for sleep 90 tablet 0   triamcinolone  (KENALOG ) 0.025 % ointment APPLY 1 APPLICATION TOPICALLY 2 (TWO) TIMES DAILY. 320 g 1   No current facility-administered medications for this visit.     Musculoskeletal: Strength & Muscle Tone: Difficulty walking due to pain on his right leg. Gait & Station: See above Patient leans: Right  Psychiatric Specialty Exam: Review of Systems  Musculoskeletal:  Muscle spasm in the hands  Neurological:  Positive for tremors.    Blood pressure (!) 149/77, pulse 80, height 5' 8 (1.727 m), weight 181 lb (82.1 kg).Body mass index is 27.52 kg/m.  General Appearance: Casual  Eye Contact:  Fair  Speech:  Normal Rate  Volume:  Normal  Mood:  Anxious  Affect:  Appropriate  Thought Process:  Goal Directed  Orientation:  Full (Time, Place, and Person)  Thought Content: WDL   Suicidal Thoughts:  No  Homicidal Thoughts:  No  Memory:  Immediate;   Fair Recent;   Fair Remote;   Good  Judgement:  Intact  Insight:  Present  Psychomotor Activity:  Decreased  Concentration:  Concentration: Fair and Attention Span: Fair  Recall:  Good  Fund of Knowledge: Fair  Language: Speaks Spanish only.  Akathisia:  No  Handed:  Right  AIMS (if indicated): not done  Assets:  Desire for Improvement Housing Social Support Transportation  ADL's:  Intact  Cognition: WNL  Sleep:  Good   Screenings: GAD-7    Flowsheet Row Office Visit from 10/16/2021 in [facility] [place] - A Dept Of [place]. [facility] Office Visit from 04/20/2020 in [facility] [place] - A Dept Of [place]. [facility] Office Visit from 10/19/2019 in [facility] [place] - A Dept Of [place]. [facility] Office Visit from 02/22/2018 in [facility] [place] - A Dept Of  [place]. [facility] Office Visit from 09/21/2017 in [facility] [place] - A Dept Of [place]. [facility]  Total GAD-7 Score 18 15 14 9 20    PHQ2-9    Flowsheet Row Office Visit from 10/16/2021 in [facility] [place] - A Dept Of . [facility] Office Visit from 07/11/2021 in [facility] [place] - A Dept Of [place]. [facility] Clinical Support from 01/04/2021 in [facility] [place] - A Dept Of [place]. [facility] Office Visit from 04/20/2020 in [facility] [place] - A Dept Of [place]. [facility] Office Visit from 10/19/2019 in [facility] [place] - A Dept Of [place]. [facility]  PHQ-2 Total Score 5 3 4 4 4   PHQ-9 Total Score 22 19 15 17 15    Flowsheet Row Office Visit from 09/27/2020 in [facility] Video Visit from 06/25/2020 in [facility]  C-SSRS RISK CATEGORY No Risk No Risk     Assessment and Plan: Patient is 53 year old man with history of lower back pain, hyperlipidemia, PTSD, anxiety and insomnia.  Discussed noncompliance with medication and slowly decompensating.  Encouraged to keep appointment.  Reported mild tremors and muscle spasm.  Recommend to cut down the Haldol  from 1 mg to 0.5 mg to take at bedtime.  Continue nortriptyline  75 mg at bedtime, Paxil  40 mg daily, trazodone  50 mg at bedtime.  He has appointment coming up to see his PCP.  Emphasis given that he should keep the appointment with PCP as we will need blood work.  His last blood work was done in 2023 which shows high level of cholesterol and LDL.  Discussed if reducing the Haldol  causing worsening of paranoia then he should call us  back.  I will follow-up in 3 months unless patient need a sooner appointment.  Treatment plan discussed with the patient through the  help of translator.  I will also forward my  note to his PCP.  Collaboration of Care: Collaboration of Care: Other provider involved in patient's care AEB notes are available in epic to review  Patient/Guardian was advised Release of Information must be obtained prior to any record release in order to collaborate their care with an outside provider. Patient/Guardian was advised if they have not already done so to contact the registration department to sign all necessary forms in order for us  to release information regarding their care.   Consent: Patient/Guardian gives verbal consent for treatment and assignment of benefits for services provided during this visit. Patient/Guardian expressed understanding and agreed to proceed.    Leni ONEIDA Client, MD 12/31/2023, 3:52 PM

## 2024-01-05 ENCOUNTER — Ambulatory Visit: Attending: Nurse Practitioner | Admitting: Nurse Practitioner

## 2024-01-05 VITALS — BP 144/94 | HR 60 | Resp 19 | Ht 68.0 in | Wt 183.4 lb

## 2024-01-05 DIAGNOSIS — I10 Essential (primary) hypertension: Secondary | ICD-10-CM

## 2024-01-05 DIAGNOSIS — R251 Tremor, unspecified: Secondary | ICD-10-CM

## 2024-01-05 DIAGNOSIS — M5417 Radiculopathy, lumbosacral region: Secondary | ICD-10-CM

## 2024-01-05 DIAGNOSIS — E781 Pure hyperglyceridemia: Secondary | ICD-10-CM

## 2024-01-05 DIAGNOSIS — Z1211 Encounter for screening for malignant neoplasm of colon: Secondary | ICD-10-CM | POA: Diagnosis not present

## 2024-01-05 DIAGNOSIS — E7800 Pure hypercholesterolemia, unspecified: Secondary | ICD-10-CM | POA: Diagnosis not present

## 2024-01-05 MED ORDER — LISINOPRIL 20 MG PO TABS: 20.0000 mg | ORAL_TABLET | Freq: Every day | ORAL | 3 refills | Status: DC

## 2024-01-05 MED ORDER — ICOSAPENT ETHYL 1 G PO CAPS: 2.0000 g | ORAL_CAPSULE | Freq: Two times a day (BID) | ORAL | 1 refills | Status: AC

## 2024-01-05 MED ORDER — ACETAMINOPHEN-CODEINE 300-30 MG PO TABS: 1.0000 | ORAL_TABLET | Freq: Two times a day (BID) | ORAL | 0 refills | Status: AC | PRN

## 2024-01-05 MED ORDER — ACETAMINOPHEN-CODEINE 300-30 MG PO TABS: 1.0000 | ORAL_TABLET | Freq: Two times a day (BID) | ORAL | 0 refills | Status: DC | PRN

## 2024-01-05 MED ORDER — ATORVASTATIN CALCIUM 40 MG PO TABS: 40.0000 mg | ORAL_TABLET | Freq: Every day | ORAL | 0 refills | Status: DC

## 2024-01-05 NOTE — Progress Notes (Signed)
 Assessment & Plan:  Joshua [name] was seen today for back pain and leg pain.  Diagnoses and all orders for this visit:  Lumbosacral radiculitis -     Discontinue: acetaminophen -codeine  (TYLENOL  #3) 300-30 MG tablet; Take 1 tablet by mouth every 12 (twelve) hours as needed for moderate pain (pain score 4-6). -     Ambulatory referral to Physical Medicine Rehab -     acetaminophen -codeine  (TYLENOL  #3) 300-30 MG tablet; Take 1 tablet by mouth every 12 (twelve) hours as needed for moderate pain (pain score 4-6). 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 Previous leg surgery, not back. Pain management with Tylenol  3 requested. Advised medication will not improve underlying condition and back and sciatica may worsen without further intervention. Previous pain relief with nerve block, but follow-up with specialist not pursued. Referral to PMR specialist recommended. - Send prescription for Tylenol  3 to [facility] pharmacy.    Primary hypertension -     CMP14+EGFR -     lisinopril  (ZESTRIL ) 20 MG tablet; Take 1 tablet (20 mg total) by mouth daily.  Colon cancer screening -     Ambulatory referral to Gastroenterology  Hypertriglyceridemia -     atorvastatin  (LIPITOR) 40 MG tablet; Take 1 tablet (40 mg total) by mouth daily. -     icosapent  Ethyl (VASCEPA ) 1 g capsule; Take 2 capsules (2 g total) by mouth 2 (two) times daily. -     Lipid panel  Occasional tremors -     Thyroid  Panel With TSH   Hypertension Consistently high blood pressure readings. Persistent hypertension noted. Discussed need for medication to manage blood pressure. - Prescribe lisinopril  20mg  daily    Patient has been counseled on age-appropriate routine health concerns for screening and prevention. These are reviewed and up-to-date. Referrals have been placed accordingly. Immunizations are up-to-date or declined.    Subjective:   Chief Complaint  Patient presents with   Back Pain    Lower back   Leg Pain    History of Present Illness Joshua [name] is a 53 year old male with chronic back pain who presents for pain management.  VRI was used to communicate directly with patient for the entire encounter including providing detailed patient instructions.     He has been experiencing chronic back pain with right sided sciatica for several years and has sought treatment from multiple providers. He has been used Tylenol  3 in the past for pain relief, which he finds effective. The last intervention he had was a pain block a few years ago, which was effective, but he did not follow up with Dr Carilyn when his pain returned. No leg weakness, bowel or bladder incontinence reported.   He experiences additional symptoms including his hands staying in a fixed position and significant pain occurring both during the day and at night.  He experiences episodes of shakiness/tremors, sometimes associated with low blood sugar levels. His blood sugar has been as low as fifty, but at other times it is normal, around 100 to 118. The shakiness can occur even after eating.  HTN Blood pressure is persistently elevated.  BP Readings from Last 3 Encounters:  01/05/24 (!) 144/94  12/31/23 (!) 149/77  09/10/23 (!) 159/89     Review of Systems  Constitutional:  Negative for fever, malaise/fatigue and weight loss.  HENT: Negative.  Negative for nosebleeds.   Eyes: Negative.  Negative for blurred vision, double vision and photophobia.  Respiratory: Negative.  Negative for cough and shortness of breath.  Cardiovascular: Negative.  Negative for chest pain, palpitations and leg swelling.  Gastrointestinal: Negative.  Negative for heartburn, nausea and vomiting.  Musculoskeletal:  Positive for back pain and joint pain. Negative for myalgias.  Neurological: Negative.  Negative for dizziness, focal weakness, seizures and headaches.  Psychiatric/Behavioral: Negative.  Negative for suicidal ideas.     Past Medical  History:  Diagnosis Date   Anxiety    Back pain with right-sided radiculopathy    Depression    High cholesterol     History reviewed. No pertinent surgical history.  Family History  Problem Relation Age of Onset   Hypertension Paternal Uncle    Diabetes Paternal Uncle     Social History Reviewed with no changes to be made today.   Outpatient Medications Prior to Visit  Medication Sig Dispense Refill   fluticasone  (FLONASE ) 50 MCG/ACT nasal spray USE 1 TO 2 SPRAYS IN EACH NOSTRIL EVERY DAY 16 g 0   haloperidol  (HALDOL ) 0.5 MG tablet Take 1 tablet (0.5 mg total) by mouth at bedtime. 30 tablet 2   nortriptyline  (PAMELOR ) 75 MG capsule Take 1 capsule (75 mg total) by mouth at bedtime. 90 capsule 0   PARoxetine  (PAXIL ) 40 MG tablet Take 1 tablet (40 mg total) by mouth daily. 90 tablet 0   traZODone  (DESYREL ) 50 MG tablet Take 1 tablet (50 mg total) by mouth at bedtime as needed. for sleep 90 tablet 0   triamcinolone  (KENALOG ) 0.025 % ointment APPLY 1 APPLICATION TOPICALLY 2 (TWO) TIMES DAILY. 320 g 1   methocarbamol  (ROBAXIN ) 500 MG tablet Take 1 tablet (500 mg total) by mouth every 6 (six) hours as needed for muscle spasms. (Patient not taking: Reported on 01/05/2024) 90 tablet 3   omeprazole  (PRILOSEC) 40 MG capsule TAKE 1 CAPSULE EVERY DAY (Patient not taking: Reported on 01/05/2024) 30 capsule 0   acetaminophen -codeine  (TYLENOL  #3) 300-30 MG tablet Take 1-2 tablets by mouth every 12 (twelve) hours as needed for moderate pain (pain score 4-6). (Patient not taking: Reported on 01/05/2024) 60 tablet 0   atorvastatin  (LIPITOR) 40 MG tablet TAKE 1 TABLET EVERY DAY (Patient not taking: Reported on 01/05/2024) 90 tablet 0   icosapent  Ethyl (VASCEPA ) 1 g capsule Take 2 capsules (2 g total) by mouth 2 (two) times daily. (Patient not taking: Reported on 01/05/2024) 360 capsule 1   naproxen  (NAPROSYN ) 500 MG tablet Take 1 tablet (500 mg total) by mouth 2 (two) times daily with a meal. Prn pain  (Patient not taking: Reported on 01/05/2024) 60 tablet 0   No facility-administered medications prior to visit.    Allergies  Allergen Reactions   Flexeril  [Cyclobenzaprine ]     Pt turns very aggressive       Objective:    BP (!) 144/94 (BP Location: Right Arm, Patient Position: Sitting, Cuff Size: Normal)   Pulse 60   Resp 19   Ht 5' 8 (1.727 m)   Wt 183 lb 6.4 oz (83.2 kg)   SpO2 96%   BMI 27.89 kg/m  Wt Readings from Last 3 Encounters:  01/05/24 183 lb 6.4 oz (83.2 kg)  12/31/23 181 lb (82.1 kg)  09/10/23 182 lb (82.6 kg)    Physical Exam Vitals and nursing note reviewed.  Constitutional:      Appearance: He is well-developed.  HENT:     Head: Normocephalic and atraumatic.  Cardiovascular:     Rate and Rhythm: Normal rate and regular rhythm.     Heart sounds: Normal heart sounds. No  murmur heard.    No friction rub. No gallop.  Pulmonary:     Effort: Pulmonary effort is normal. No tachypnea or respiratory distress.     Breath sounds: Normal breath sounds. No decreased breath sounds, wheezing, rhonchi or rales.  Chest:     Chest wall: No tenderness.  Musculoskeletal:        General: Normal range of motion.     Cervical back: Normal range of motion.  Skin:    General: Skin is warm and dry.  Neurological:     Mental Status: He is alert and oriented to person, place, and time.     Coordination: Coordination normal.  Psychiatric:        Behavior: Behavior normal. Behavior is cooperative.        Thought Content: Thought content normal.        Judgment: Judgment normal.          Patient has been counseled extensively about nutrition and exercise as well as the importance of adherence with medications and regular follow-up. The patient was given clear instructions to go to ER or return to medical center if symptoms don't improve, worsen or new problems develop. The patient verbalized understanding.   Follow-up: Return in about 3 months (around 04/05/2024) for  BP recheck.   Haze LELON Servant, FNP-BC [facility] [place], [place] [phone]   01/05/2024, 5:17 PM

## 2024-01-05 NOTE — Progress Notes (Signed)
 Patient states back and right leg.

## 2024-01-08 LAB — LIPID PANEL
Chol/HDL Ratio: 7.2 ratio — ABNORMAL HIGH (ref 0.0–5.0)
Cholesterol, Total: 208 mg/dL — ABNORMAL HIGH (ref 100–199)
HDL: 29 mg/dL — ABNORMAL LOW (ref >39)
LDL Chol Calc (NIH): 136 mg/dL — ABNORMAL HIGH (ref 0–99)
Triglycerides: 240 mg/dL — ABNORMAL HIGH (ref 0–149)
VLDL Cholesterol Cal: 43 mg/dL — ABNORMAL HIGH (ref 5–40)

## 2024-01-08 LAB — THYROID PANEL WITH TSH
Free Thyroxine Index: 1.9 (ref 1.2–4.9)
T3 Uptake Ratio: 24 % (ref 24–39)
T4, Total: 7.8 ug/dL (ref 4.5–12.0)
TSH: 0.719 u[IU]/mL (ref 0.450–4.500)

## 2024-01-08 LAB — CMP14+EGFR
ALT: 49 IU/L — ABNORMAL HIGH (ref 0–44)
AST: 26 IU/L (ref 0–40)
Albumin: 4.5 g/dL (ref 3.8–4.9)
Alkaline Phosphatase: 98 IU/L (ref 47–123)
BUN/Creatinine Ratio: 15 (ref 9–20)
BUN: 14 mg/dL (ref 6–24)
Bilirubin Total: 0.4 mg/dL (ref 0.0–1.2)
CO2: 25 mmol/L (ref 20–29)
Calcium: 9.5 mg/dL (ref 8.7–10.2)
Chloride: 103 mmol/L (ref 96–106)
Creatinine, Ser: 0.94 mg/dL (ref 0.76–1.27)
Globulin, Total: 2.8 g/dL (ref 1.5–4.5)
Glucose: 74 mg/dL (ref 70–99)
Potassium: 4.2 mmol/L (ref 3.5–5.2)
Sodium: 141 mmol/L (ref 134–144)
Total Protein: 7.3 g/dL (ref 6.0–8.5)
eGFR: 98 mL/min/1.73 (ref >59)

## 2024-01-14 ENCOUNTER — Ambulatory Visit: Payer: Self-pay | Admitting: Nurse Practitioner

## 2024-01-14 DIAGNOSIS — E781 Pure hyperglyceridemia: Secondary | ICD-10-CM

## 2024-01-14 MED ORDER — ATORVASTATIN CALCIUM 80 MG PO TABS: 80.0000 mg | ORAL_TABLET | Freq: Every day | ORAL | 3 refills | Status: DC

## 2024-02-17 ENCOUNTER — Other Ambulatory Visit: Payer: Self-pay | Admitting: Nurse Practitioner

## 2024-02-17 DIAGNOSIS — I10 Essential (primary) hypertension: Secondary | ICD-10-CM

## 2024-02-17 DIAGNOSIS — E781 Pure hyperglyceridemia: Secondary | ICD-10-CM

## 2024-02-17 NOTE — Telephone Encounter (Unsigned)
 Copied from CRM #[id number]. Topic: Clinical - Medication Refill >> Feb 17, 2024 10:52 AM Lonell PEDLAR wrote: Medication:  atorvastatin  (LIPITOR) 80 MG tablet lisinopril  (ZESTRIL ) 20 MG tablet  Has the patient contacted their pharmacy? No, pharmacy calling    This is the patient's preferred pharmacy:   [facility] - [place], [place] - [address] [place] [address] Phone: [phone] Fax: [phone]  Is this the correct pharmacy for this prescription? Yes If no, delete pharmacy and type the correct one.   Has the prescription been filled recently? Yes  Is the patient out of the medication? Unsure, pharmacy calling  Has the patient been seen for an appointment in the last year OR does the patient have an upcoming appointment? Yes  Can we respond through MyChart? No  Agent: Please be advised that Rx refills may take up to 3 business days. We ask that you follow-up with your pharmacy.

## 2024-02-18 MED ORDER — ATORVASTATIN CALCIUM 80 MG PO TABS: 80.0000 mg | ORAL_TABLET | Freq: Every day | ORAL | 3 refills | Status: AC

## 2024-02-18 MED ORDER — LISINOPRIL 20 MG PO TABS: 20.0000 mg | ORAL_TABLET | Freq: Every day | ORAL | 3 refills | Status: AC

## 2024-02-18 NOTE — Telephone Encounter (Signed)
 Requested medication (s) are due for refill today: na  Requested medication (s) are on the active medication list: yes  Last refill:  lipitor- 01/14/24 #90 3 refills, zestril  -01/05/24 #90 3 refills  Future visit scheduled: yes 04/11/24  Notes to clinic:  patient requesting different pharmacy. Do you want to refill for #90 3 refills?     Requested Prescriptions  Pending Prescriptions Disp Refills   atorvastatin  (LIPITOR) 80 MG tablet 90 tablet 3    Sig: Take 1 tablet (80 mg total) by mouth daily.     Cardiovascular:  Antilipid - Statins Failed - 02/18/2024  4:10 PM      Failed - Lipid Panel in normal range within the last 12 months    Cholesterol, Total  Date Value Ref Range Status  01/05/2024 208 (H) 100 - 199 mg/dL Final   LDL Chol Calc (NIH)  Date Value Ref Range Status  01/05/2024 136 (H) 0 - 99 mg/dL Final   HDL  Date Value Ref Range Status  01/05/2024 29 (L) >39 mg/dL Final   Triglycerides  Date Value Ref Range Status  01/05/2024 240 (H) 0 - 149 mg/dL Final         Passed - Patient is not pregnant      Passed - Valid encounter within last 12 months    Recent Outpatient Visits           1 month ago Lumbosacral radiculitis   [place] [facility] Of [place]. [facility] [place], [place] W, NP   2 years ago Chronic bilateral low back pain with right-sided sciatica   [place] [facility] Dept Of [place]. [facility] [place], [place] W, NP   2 years ago Chronic bilateral low back pain with right-sided sciatica   [place] [facility] - A Dept Of [place]. [facility] [place], [place], [place]   3 years ago Chronic bilateral low back pain with right-sided sciatica   [place] [facility] - A Dept Of [place]. [facility] Theotis Haze ORN, NP   3 years ago Mixed hyperlipidemia   [place] [facility] [place] - A Dept Of [place]. [facility] [place], [place] W, NP                lisinopril  (ZESTRIL ) 20 MG tablet 90 tablet 3    Sig: Take 1 tablet (20 mg total) by mouth daily.     Cardiovascular:  ACE Inhibitors Failed - 02/18/2024  4:10 PM      Failed - Last BP in normal range    BP Readings from Last 1 Encounters:  01/05/24 (!) 144/94         Passed - Cr in normal range and within 180 days    Creatinine, Ser  Date Value Ref Range Status  01/05/2024 0.94 0.76 - 1.27 mg/dL Final         Passed - K in normal range and within 180 days    Potassium  Date Value Ref Range Status  01/05/2024 4.2 3.5 - 5.2 mmol/L Final         Passed - Patient is not pregnant      Passed - Valid encounter within last 6 months    Recent Outpatient Visits           1 month ago Lumbosacral radiculitis   [place] [facility] - A Dept Of  [place]. [facility] [place], [place] W, NP   2 years ago Chronic bilateral low back pain with right-sided sciatica   [place] [facility] - A Dept Of [place]. [facility] [place], [place] W, NP   2 years ago Chronic bilateral low back pain with right-sided sciatica   [place] [facility] - A Dept Of [place]. [facility] [place], [place], [place]   3 years ago Chronic bilateral low back pain with right-sided sciatica   [place] [facility] - A Dept Of [place]. [facility] Theotis Haze ORN, NP   3 years ago Mixed hyperlipidemia    [facility] [place] [facility] Dept Of [place]. [facility] Theotis Haze ORN, [place]

## 2024-03-31 ENCOUNTER — Other Ambulatory Visit: Payer: Self-pay

## 2024-03-31 ENCOUNTER — Ambulatory Visit (HOSPITAL_COMMUNITY): Admitting: Psychiatry

## 2024-03-31 ENCOUNTER — Encounter (HOSPITAL_COMMUNITY): Payer: Self-pay | Admitting: Psychiatry

## 2024-03-31 VITALS — BP 135/87 | HR 60 | Ht 68.0 in | Wt 180.0 lb

## 2024-03-31 DIAGNOSIS — F5101 Primary insomnia: Secondary | ICD-10-CM | POA: Diagnosis not present

## 2024-03-31 DIAGNOSIS — F411 Generalized anxiety disorder: Secondary | ICD-10-CM | POA: Diagnosis not present

## 2024-03-31 DIAGNOSIS — F331 Major depressive disorder, recurrent, moderate: Secondary | ICD-10-CM | POA: Diagnosis not present

## 2024-03-31 MED ORDER — HALOPERIDOL 0.5 MG PO TABS: 0.5000 mg | ORAL_TABLET | Freq: Every day | ORAL | 2 refills | Status: AC

## 2024-03-31 MED ORDER — PAROXETINE HCL 40 MG PO TABS: 40.0000 mg | ORAL_TABLET | Freq: Every day | ORAL | 0 refills | Status: AC

## 2024-03-31 MED ORDER — TRAZODONE HCL 50 MG PO TABS: 50.0000 mg | ORAL_TABLET | Freq: Every evening | ORAL | 0 refills | Status: AC | PRN

## 2024-03-31 MED ORDER — NORTRIPTYLINE HCL 75 MG PO CAPS: 75.0000 mg | ORAL_CAPSULE | Freq: Every day | ORAL | 0 refills | Status: AC

## 2024-03-31 NOTE — Progress Notes (Signed)
 [facility] MD/PA/NP OP Progress Note  03/31/2024 2:51 PM Joshua [name]  MRN:  [id number]  Chief Complaint:  Chief Complaint  Patient presents with   Follow-up   Medication Refill   HPI: Patient came today for his follow-up appointment in the office with the translator and his daughter.  He reported lately some emotional and crying because his daughter is getting married in December.  He also reported daughter had an accident few weeks ago and car was destroyed but luckily no injury to the daughter.  He is taking medication.  Sometime he had ruminative and paranoid thoughts but no delusion, voices, hallucination or any suicidal thoughts.  He is sleeping okay.  He does not go to public places and get very anxious around people.  He has mild tremors but not worsening.  Denies any drug use.  Denies any agitation, anger.  Appetite is okay.  Weight stable.  Seen PCP and labs drawn which are stable.  Denies any panic attack.  Denies any anhedonia.  He like to keep his current medication.  Visit Diagnosis:    ICD-10-CM   1. MDD (major depressive disorder), recurrent episode, moderate (HCC)  F33.1 haloperidol  (HALDOL ) 0.5 MG tablet    traZODone  (DESYREL ) 50 MG tablet    PARoxetine  (PAXIL ) 40 MG tablet    nortriptyline  (PAMELOR ) 75 MG capsule    2. Generalized anxiety disorder  F41.1 traZODone  (DESYREL ) 50 MG tablet    PARoxetine  (PAXIL ) 40 MG tablet    nortriptyline  (PAMELOR ) 75 MG capsule    3. Primary insomnia  F51.01 traZODone  (DESYREL ) 50 MG tablet        Past Psychiatric History: Reviewed H/O inpatient in [place] in 2008 when walked into traffic.  Tried Risperdal, Cymbalta  and temazepam with poor outcome.  Seeing psychiatrist in our office since October 2018. Abilify  helped but couldn't afford.   Past Medical History:  Past Medical History:  Diagnosis Date   Anxiety    Back pain with right-sided radiculopathy    Depression    High cholesterol    No past surgical history on  file.  Family Psychiatric History: Reviewed  Family History:  Family History  Problem Relation Age of Onset   Hypertension Paternal Uncle    Diabetes Paternal Uncle     Social History:  Social History   Socioeconomic History   Marital status: Married    Spouse name: Not on file   Number of children: Not on file   Years of education: Not on file   Highest education level: Associate degree: occupational, scientist, product/process development, or vocational program  Occupational History   Not on file  Tobacco Use   Smoking status: Never   Smokeless tobacco: Never  Vaping Use   Vaping status: Never Used  Substance and Sexual Activity   Alcohol use: No   Drug use: No   Sexual activity: Yes    Birth control/protection: None  Other Topics Concern   Not on file  Social History Narrative   Not on file   Social Drivers of Health   Tobacco Use: Low Risk (01/05/2024)   Patient History    Smoking Tobacco Use: Never    Smokeless Tobacco Use: Never    Passive Exposure: Not on file  Financial Resource Strain: High Risk (01/05/2024)   Overall Financial Resource Strain (CARDIA)    Difficulty of Paying Living Expenses: Hard  Food Insecurity: Food Insecurity Present (01/05/2024)   Epic    Worried About Radiation Protection Practitioner of Food in the Last  Year: Sometimes true    Ran Out of Food in the Last Year: Sometimes true  Transportation Needs: No Transportation Needs (01/05/2024)   Epic    Lack of Transportation (Medical): No    Lack of Transportation (Non-Medical): No  Physical Activity: Inactive (01/05/2024)   Exercise Vital Sign    Days of Exercise per Week: 0 days    Minutes of Exercise per Session: Not on file  Stress: Stress Concern Present (01/05/2024)   [employer] of Occupational Health - Occupational Stress Questionnaire    Feeling of Stress: Very much  Social Connections: Moderately Isolated (01/05/2024)   Social Connection and Isolation Panel    Frequency of Communication with Friends and Family: Never     Frequency of Social Gatherings with Friends and Family: Once a week    Attends Religious Services: More than 4 times per year    Active Member of [employer] or Organizations: No    Attends Banker Meetings: Not on file    Marital Status: Married  Depression (PHQ2-9): High Risk (10/16/2021)   Depression (PHQ2-9)    PHQ-2 Score: 22  Alcohol Screen: Not on file  Housing: Low Risk (01/05/2024)   Epic    Unable to Pay for Housing in the Last Year: No    Number of Times Moved in the Last Year: 0    Homeless in the Last Year: No  Utilities: Not on file  Health Literacy: Not on file    Allergies:  Allergies  Allergen Reactions   Flexeril  [Cyclobenzaprine ]     Pt turns very aggressive    Metabolic Disorder Labs: No results found for: HGBA1C, MPG No results found for: PROLACTIN Lab Results  Component Value Date   CHOL 208 (H) 01/05/2024   TRIG 240 (H) 01/05/2024   HDL 29 (L) 01/05/2024   CHOLHDL 7.2 (H) 01/05/2024   LDLCALC 136 (H) 01/05/2024   LDLCALC 160 (H) 10/16/2021   Lab Results  Component Value Date   TSH 0.719 01/05/2024    Therapeutic Level Labs: No results found for: LITHIUM No results found for: VALPROATE No results found for: CBMZ  Current Medications: Current Outpatient Medications  Medication Sig Dispense Refill   atorvastatin  (LIPITOR) 80 MG tablet Take 1 tablet (80 mg total) by mouth daily. 90 tablet 3   fluticasone  (FLONASE ) 50 MCG/ACT nasal spray USE 1 TO 2 SPRAYS IN EACH NOSTRIL EVERY DAY 16 g 0   haloperidol  (HALDOL ) 0.5 MG tablet Take 1 tablet (0.5 mg total) by mouth at bedtime. 30 tablet 2   icosapent  Ethyl (VASCEPA ) 1 g capsule Take 2 capsules (2 g total) by mouth 2 (two) times daily. 360 capsule 1   lisinopril  (ZESTRIL ) 20 MG tablet Take 1 tablet (20 mg total) by mouth daily. 90 tablet 3   nortriptyline  (PAMELOR ) 75 MG capsule Take 1 capsule (75 mg total) by mouth at bedtime. 90 capsule 0   PARoxetine  (PAXIL ) 40 MG tablet Take 1  tablet (40 mg total) by mouth daily. 90 tablet 0   traZODone  (DESYREL ) 50 MG tablet Take 1 tablet (50 mg total) by mouth at bedtime as needed. for sleep 90 tablet 0   methocarbamol  (ROBAXIN ) 500 MG tablet Take 1 tablet (500 mg total) by mouth every 6 (six) hours as needed for muscle spasms. (Patient not taking: Reported on 03/31/2024) 90 tablet 3   omeprazole  (PRILOSEC) 40 MG capsule TAKE 1 CAPSULE EVERY DAY (Patient not taking: Reported on 01/05/2024) 30 capsule 0   triamcinolone  (KENALOG )  0.025 % ointment APPLY 1 APPLICATION TOPICALLY 2 (TWO) TIMES DAILY. (Patient not taking: Reported on 03/31/2024) 320 g 1   No current facility-administered medications for this visit.     Musculoskeletal: Strength & Muscle Tone: within normal limits Gait & Station: normal Patient leans: N/A  Psychiatric Specialty Exam: Review of Systems  Blood pressure 135/87, pulse 60, height 5' 8 (1.727 m), weight 180 lb (81.6 kg).Body mass index is 27.37 kg/m.  General Appearance: Casual  Eye Contact:  Fair  Speech:  Slow  Volume:  Normal  Mood:  Anxious and emotional  Affect:  Appropriate  Thought Process:  Goal Directed  Orientation:  Full (Time, Place, and Person)  Thought Content: WDL   Suicidal Thoughts:  No  Homicidal Thoughts:  No  Memory:  Immediate;   Fair Recent;   Fair Remote;   Good  Judgement:  Intact  Insight:  Present  Psychomotor Activity:  Tremor  Concentration:  Concentration: Fair and Attention Span: Fair  Recall:  Good  Fund of Knowledge: Fair  Language: Speaks Spanish only.  Akathisia:  No  Handed:  Right  AIMS (if indicated): not done  Assets:  Desire for Improvement Housing Social Support Transportation  ADL's:  Intact  Cognition: WNL  Sleep:  Good   Screenings: GAD-7    Flowsheet Row Office Visit from 10/16/2021 in [facility] Dept Of [place]. [facility] Office Visit from 04/20/2020 in [facility] [place] - A Dept Of [place] DEL. [facility] Office Visit from 10/19/2019 in [facility] [place] - A Dept Of [place] DEL. [facility] Office Visit from 02/22/2018 in [facility] [place] - A Dept Of [place] DEL. [facility] Office Visit from 09/21/2017 in [facility] [place] - A Dept Of [place] DEL. [facility]  Total GAD-7 Score 18 15 14 9 20    PHQ2-9    Flowsheet Row Office Visit from 10/16/2021 in [facility] [place] - A Dept Of . [facility] Office Visit from 07/11/2021 in [facility] [place] - A Dept Of [place] DEL. [facility] Clinical Support from 01/04/2021 in [facility] [place] - A Dept Of [place] DEL. [facility] Office Visit from 04/20/2020 in [facility] [place] - A Dept Of [place] DEL. [facility] Office Visit from 10/19/2019 in [facility] [place] - A Dept Of [place] DEL. [facility]  PHQ-2 Total Score 5 3 4 4 4   PHQ-9 Total Score 22 19 15 17 15    Flowsheet Row [facility]/27/2020 in [facility] Video Visit from 06/25/2020 in [facility]  C-SSRS RISK CATEGORY No Risk No Risk     Assessment and Plan: Patient is 53 year old man with history of lower back pain, hyperlipidemia, PTSD, anxiety and insomnia.  Since taking the medication has been doing well and occasionally paranoia but symptoms are manageable and stable.  Discussed recent crying which could be due to daughter getting married and feeling more emotional and also daughter had accident and it was stressful.  Things are much better now.  Encouraged to keep the current medication send is working well.  He already have blood work and reviewed the results which are stable.  He has mild tremors but stable.  We had cut down his Abilify  from 1 mg to 0.5 mg  to help the muscle spasm which had helped and tremors are not as  bad.  Continue Haldol  0.5 mg at bedtime, nortriptyline  75 mg at bedtime, Paxil  40 mg daily and trazodone  50 mg at bedtime.  Recommend to call back if is any question or any concern.  Follow-up in 3 months.  Treatment plan discussed with the patient with the help of translator.  His daughter was also present in the session.  Encouraged to call back if is any question or any concern.  Follow-up in 3 months  Collaboration of Care: Collaboration of Care: Other provider involved in patient's care AEB notes are available in epic to review  Patient/Guardian was advised Release of Information must be obtained prior to any record release in order to collaborate their care with an outside provider. Patient/Guardian was advised if they have not already done so to contact the registration department to sign all necessary forms in order for us  to release information regarding their care.   Consent: Patient/Guardian gives verbal consent for treatment and assignment of benefits for services provided during this visit. Patient/Guardian expressed understanding and agreed to proceed.    Leni ONEIDA Client, MD 03/31/2024, 2:51 PM

## 2024-04-08 ENCOUNTER — Telehealth: Payer: Self-pay | Admitting: Nurse Practitioner

## 2024-04-08 NOTE — Telephone Encounter (Signed)
 Contacted patient; unable to reach. Left voicemail with appointment details.

## 2024-04-11 ENCOUNTER — Ambulatory Visit: Admitting: Nurse Practitioner

## 2024-06-30 ENCOUNTER — Ambulatory Visit (HOSPITAL_COMMUNITY): Admitting: Psychiatry
